# Patient Record
Sex: Male | Born: 1945 | Race: White | Hispanic: No | Smoking: Never smoker
Health system: Southern US, Community
[De-identification: ages and names within clinical notes are randomized; demographics above are authoritative.]

## PROBLEM LIST (undated history)

## (undated) DIAGNOSIS — R42 Dizziness and giddiness: Secondary | ICD-10-CM

## (undated) DIAGNOSIS — K2100 Gastro-esophageal reflux disease with esophagitis, without bleeding: Secondary | ICD-10-CM

## (undated) DIAGNOSIS — C801 Malignant (primary) neoplasm, unspecified: Secondary | ICD-10-CM

## (undated) DIAGNOSIS — E669 Obesity, unspecified: Secondary | ICD-10-CM

## (undated) DIAGNOSIS — K2210 Ulcer of esophagus without bleeding: Secondary | ICD-10-CM

## (undated) DIAGNOSIS — E1141 Type 2 diabetes mellitus with diabetic mononeuropathy: Secondary | ICD-10-CM

## (undated) DIAGNOSIS — T63331A Toxic effect of venom of brown recluse spider, accidental (unintentional), initial encounter: Secondary | ICD-10-CM

## (undated) DIAGNOSIS — I700 Atherosclerosis of aorta: Secondary | ICD-10-CM

## (undated) DIAGNOSIS — I209 Angina pectoris, unspecified: Secondary | ICD-10-CM

## (undated) DIAGNOSIS — G4730 Sleep apnea, unspecified: Secondary | ICD-10-CM

## (undated) DIAGNOSIS — N1831 Chronic kidney disease, stage 3a: Secondary | ICD-10-CM

## (undated) DIAGNOSIS — E1122 Type 2 diabetes mellitus with diabetic chronic kidney disease: Secondary | ICD-10-CM

## (undated) DIAGNOSIS — E119 Type 2 diabetes mellitus without complications: Secondary | ICD-10-CM

## (undated) DIAGNOSIS — I10 Essential (primary) hypertension: Secondary | ICD-10-CM

## (undated) DIAGNOSIS — E785 Hyperlipidemia, unspecified: Secondary | ICD-10-CM

## (undated) DIAGNOSIS — J449 Chronic obstructive pulmonary disease, unspecified: Secondary | ICD-10-CM

## (undated) DIAGNOSIS — I272 Pulmonary hypertension, unspecified: Secondary | ICD-10-CM

## (undated) DIAGNOSIS — I2510 Atherosclerotic heart disease of native coronary artery without angina pectoris: Secondary | ICD-10-CM

## (undated) DIAGNOSIS — I2721 Secondary pulmonary arterial hypertension: Secondary | ICD-10-CM

## (undated) DIAGNOSIS — G4733 Obstructive sleep apnea (adult) (pediatric): Secondary | ICD-10-CM

## (undated) HISTORY — PX: COLONOSCOPY W/ POLYPECTOMY: SHX1380

## (undated) HISTORY — PX: ANKLE FRACTURE SURGERY: SHX122

## (undated) HISTORY — PX: OTHER SURGICAL HISTORY: SHX169

## (undated) HISTORY — PX: HERNIA REPAIR: SHX51

## (undated) HISTORY — PX: BASAL CELL CARCINOMA EXCISION: SHX1214

## (undated) HISTORY — PX: LEG SURGERY: SHX1003

---

## 1982-04-25 HISTORY — PX: FRACTURE SURGERY: SHX138

## 2006-12-09 ENCOUNTER — Other Ambulatory Visit: Payer: Self-pay

## 2006-12-09 ENCOUNTER — Emergency Department: Payer: Self-pay | Admitting: Emergency Medicine

## 2007-04-11 ENCOUNTER — Ambulatory Visit: Payer: Self-pay | Admitting: Internal Medicine

## 2007-04-20 ENCOUNTER — Ambulatory Visit: Payer: Self-pay | Admitting: Emergency Medicine

## 2007-07-30 ENCOUNTER — Ambulatory Visit: Payer: Self-pay | Admitting: Gastroenterology

## 2010-08-09 ENCOUNTER — Ambulatory Visit: Payer: Self-pay | Admitting: Unknown Physician Specialty

## 2010-08-24 ENCOUNTER — Ambulatory Visit: Payer: Self-pay | Admitting: Unknown Physician Specialty

## 2010-09-24 ENCOUNTER — Ambulatory Visit: Payer: Self-pay | Admitting: Unknown Physician Specialty

## 2010-10-24 ENCOUNTER — Ambulatory Visit: Payer: Self-pay | Admitting: Unknown Physician Specialty

## 2010-10-25 ENCOUNTER — Ambulatory Visit: Payer: Self-pay | Admitting: Gastroenterology

## 2010-10-28 LAB — PATHOLOGY REPORT

## 2011-03-31 ENCOUNTER — Emergency Department: Payer: Self-pay | Admitting: *Deleted

## 2012-05-28 ENCOUNTER — Ambulatory Visit: Payer: Self-pay | Admitting: Specialist

## 2013-05-23 ENCOUNTER — Ambulatory Visit: Payer: Self-pay | Admitting: Specialist

## 2013-09-29 DIAGNOSIS — E1141 Type 2 diabetes mellitus with diabetic mononeuropathy: Secondary | ICD-10-CM | POA: Insufficient documentation

## 2013-09-29 DIAGNOSIS — E1169 Type 2 diabetes mellitus with other specified complication: Secondary | ICD-10-CM | POA: Insufficient documentation

## 2013-09-29 DIAGNOSIS — E785 Hyperlipidemia, unspecified: Secondary | ICD-10-CM | POA: Insufficient documentation

## 2015-06-01 DIAGNOSIS — Z6835 Body mass index (BMI) 35.0-35.9, adult: Secondary | ICD-10-CM | POA: Insufficient documentation

## 2015-07-10 ENCOUNTER — Ambulatory Visit: Payer: Medicare Other | Attending: Specialist

## 2015-07-10 DIAGNOSIS — G4761 Periodic limb movement disorder: Secondary | ICD-10-CM | POA: Insufficient documentation

## 2015-07-10 DIAGNOSIS — G4733 Obstructive sleep apnea (adult) (pediatric): Secondary | ICD-10-CM | POA: Diagnosis not present

## 2015-07-10 DIAGNOSIS — R0683 Snoring: Secondary | ICD-10-CM | POA: Diagnosis present

## 2015-08-31 DIAGNOSIS — C4442 Squamous cell carcinoma of skin of scalp and neck: Secondary | ICD-10-CM | POA: Insufficient documentation

## 2015-10-16 ENCOUNTER — Ambulatory Visit: Payer: Medicare Other | Attending: Specialist

## 2015-10-16 DIAGNOSIS — G4761 Periodic limb movement disorder: Secondary | ICD-10-CM | POA: Diagnosis not present

## 2015-10-16 DIAGNOSIS — G4733 Obstructive sleep apnea (adult) (pediatric): Secondary | ICD-10-CM | POA: Insufficient documentation

## 2015-10-19 ENCOUNTER — Other Ambulatory Visit: Payer: Self-pay | Admitting: Gastroenterology

## 2015-10-19 DIAGNOSIS — R1310 Dysphagia, unspecified: Secondary | ICD-10-CM

## 2015-10-28 ENCOUNTER — Ambulatory Visit
Admission: RE | Admit: 2015-10-28 | Discharge: 2015-10-28 | Disposition: A | Discharge status: Home / Self Care | Payer: Medicare Other | Source: Ambulatory Visit | Attending: Gastroenterology | Admitting: Gastroenterology

## 2015-10-28 DIAGNOSIS — K449 Diaphragmatic hernia without obstruction or gangrene: Secondary | ICD-10-CM | POA: Diagnosis not present

## 2015-10-28 DIAGNOSIS — R1310 Dysphagia, unspecified: Secondary | ICD-10-CM | POA: Diagnosis present

## 2015-10-28 DIAGNOSIS — K228 Other specified diseases of esophagus: Secondary | ICD-10-CM | POA: Diagnosis not present

## 2016-01-15 ENCOUNTER — Encounter: Payer: Self-pay | Admitting: *Deleted

## 2016-01-18 ENCOUNTER — Encounter: Payer: Self-pay | Admitting: *Deleted

## 2016-01-18 ENCOUNTER — Ambulatory Visit
Admission: RE | Admit: 2016-01-18 | Discharge: 2016-01-18 | Disposition: A | Discharge status: Home / Self Care | Payer: Medicare Other | Source: Ambulatory Visit | Attending: Gastroenterology | Admitting: Gastroenterology

## 2016-01-18 ENCOUNTER — Encounter
Admission: RE | Disposition: A | Discharge status: Home / Self Care | Payer: Self-pay | Source: Ambulatory Visit | Attending: Gastroenterology

## 2016-01-18 ENCOUNTER — Ambulatory Visit: Payer: Medicare Other | Admitting: Anesthesiology

## 2016-01-18 DIAGNOSIS — K2970 Gastritis, unspecified, without bleeding: Secondary | ICD-10-CM | POA: Insufficient documentation

## 2016-01-18 DIAGNOSIS — Z6837 Body mass index (BMI) 37.0-37.9, adult: Secondary | ICD-10-CM | POA: Diagnosis not present

## 2016-01-18 DIAGNOSIS — K209 Esophagitis, unspecified: Secondary | ICD-10-CM | POA: Insufficient documentation

## 2016-01-18 DIAGNOSIS — K5730 Diverticulosis of large intestine without perforation or abscess without bleeding: Secondary | ICD-10-CM | POA: Diagnosis not present

## 2016-01-18 DIAGNOSIS — J449 Chronic obstructive pulmonary disease, unspecified: Secondary | ICD-10-CM | POA: Diagnosis not present

## 2016-01-18 DIAGNOSIS — Z881 Allergy status to other antibiotic agents status: Secondary | ICD-10-CM | POA: Diagnosis not present

## 2016-01-18 DIAGNOSIS — K2210 Ulcer of esophagus without bleeding: Secondary | ICD-10-CM | POA: Insufficient documentation

## 2016-01-18 DIAGNOSIS — R1310 Dysphagia, unspecified: Secondary | ICD-10-CM | POA: Insufficient documentation

## 2016-01-18 DIAGNOSIS — K2980 Duodenitis without bleeding: Secondary | ICD-10-CM | POA: Diagnosis not present

## 2016-01-18 DIAGNOSIS — Z1211 Encounter for screening for malignant neoplasm of colon: Secondary | ICD-10-CM | POA: Insufficient documentation

## 2016-01-18 DIAGNOSIS — E119 Type 2 diabetes mellitus without complications: Secondary | ICD-10-CM | POA: Diagnosis not present

## 2016-01-18 DIAGNOSIS — G4730 Sleep apnea, unspecified: Secondary | ICD-10-CM | POA: Insufficient documentation

## 2016-01-18 DIAGNOSIS — E785 Hyperlipidemia, unspecified: Secondary | ICD-10-CM | POA: Diagnosis not present

## 2016-01-18 DIAGNOSIS — D122 Benign neoplasm of ascending colon: Secondary | ICD-10-CM | POA: Diagnosis not present

## 2016-01-18 DIAGNOSIS — E669 Obesity, unspecified: Secondary | ICD-10-CM | POA: Insufficient documentation

## 2016-01-18 DIAGNOSIS — Z886 Allergy status to analgesic agent status: Secondary | ICD-10-CM | POA: Insufficient documentation

## 2016-01-18 DIAGNOSIS — D123 Benign neoplasm of transverse colon: Secondary | ICD-10-CM | POA: Insufficient documentation

## 2016-01-18 DIAGNOSIS — I272 Other secondary pulmonary hypertension: Secondary | ICD-10-CM | POA: Diagnosis not present

## 2016-01-18 DIAGNOSIS — Z86010 Personal history of colonic polyps: Secondary | ICD-10-CM | POA: Insufficient documentation

## 2016-01-18 DIAGNOSIS — Z7984 Long term (current) use of oral hypoglycemic drugs: Secondary | ICD-10-CM | POA: Insufficient documentation

## 2016-01-18 DIAGNOSIS — Z7951 Long term (current) use of inhaled steroids: Secondary | ICD-10-CM | POA: Diagnosis not present

## 2016-01-18 DIAGNOSIS — Z79899 Other long term (current) drug therapy: Secondary | ICD-10-CM | POA: Diagnosis not present

## 2016-01-18 DIAGNOSIS — D124 Benign neoplasm of descending colon: Secondary | ICD-10-CM | POA: Diagnosis not present

## 2016-01-18 DIAGNOSIS — I10 Essential (primary) hypertension: Secondary | ICD-10-CM | POA: Insufficient documentation

## 2016-01-18 HISTORY — DX: Sleep apnea, unspecified: G47.30

## 2016-01-18 HISTORY — DX: Chronic obstructive pulmonary disease, unspecified: J44.9

## 2016-01-18 HISTORY — DX: Obesity, unspecified: E66.9

## 2016-01-18 HISTORY — DX: Hyperlipidemia, unspecified: E78.5

## 2016-01-18 HISTORY — PX: COLONOSCOPY WITH PROPOFOL: SHX5780

## 2016-01-18 HISTORY — DX: Secondary pulmonary arterial hypertension: I27.21

## 2016-01-18 HISTORY — PX: ESOPHAGOGASTRODUODENOSCOPY (EGD) WITH PROPOFOL: SHX5813

## 2016-01-18 HISTORY — DX: Essential (primary) hypertension: I10

## 2016-01-18 HISTORY — DX: Type 2 diabetes mellitus without complications: E11.9

## 2016-01-18 HISTORY — DX: Toxic effect of venom of brown recluse spider, accidental (unintentional), initial encounter: T63.331A

## 2016-01-18 LAB — GLUCOSE, CAPILLARY: Glucose-Capillary: 146 mg/dL — ABNORMAL HIGH (ref 65–99)

## 2016-01-18 SURGERY — ESOPHAGOGASTRODUODENOSCOPY (EGD) WITH PROPOFOL
Anesthesia: General

## 2016-01-18 MED ORDER — PHENYLEPHRINE HCL 10 MG/ML IJ SOLN
INTRAMUSCULAR | Status: DC | PRN
  Administered 2016-01-18 (×2): 100 ug via INTRAVENOUS

## 2016-01-18 MED ORDER — FENTANYL CITRATE (PF) 100 MCG/2ML IJ SOLN
INTRAMUSCULAR | Status: DC | PRN
  Administered 2016-01-18: 50 ug via INTRAVENOUS

## 2016-01-18 MED ORDER — SODIUM CHLORIDE 0.9 % IV SOLN
INTRAVENOUS | Status: DC
  Administered 2016-01-18 (×2): via INTRAVENOUS

## 2016-01-18 MED ORDER — PROPOFOL 500 MG/50ML IV EMUL
INTRAVENOUS | Status: DC | PRN
  Administered 2016-01-18: 125 ug/kg/min via INTRAVENOUS

## 2016-01-18 MED ORDER — GLYCOPYRROLATE 0.2 MG/ML IJ SOLN
INTRAMUSCULAR | Status: DC | PRN
  Administered 2016-01-18: 0.2 mg via INTRAVENOUS

## 2016-01-18 MED ORDER — EPHEDRINE SULFATE 50 MG/ML IJ SOLN
INTRAMUSCULAR | Status: DC | PRN
Start: 2016-01-18 — End: 2016-01-18
  Administered 2016-01-18: 10 mg via INTRAVENOUS

## 2016-01-18 MED ORDER — SODIUM CHLORIDE 0.9 % IV SOLN: INTRAVENOUS | Status: DC

## 2016-01-18 MED ORDER — MIDAZOLAM HCL 2 MG/2ML IJ SOLN
INTRAMUSCULAR | Status: DC | PRN
  Administered 2016-01-18: 1 mg via INTRAVENOUS

## 2016-01-18 MED ORDER — PROPOFOL 10 MG/ML IV BOLUS
INTRAVENOUS | Status: DC | PRN
  Administered 2016-01-18: 50 mg via INTRAVENOUS

## 2016-01-18 NOTE — Anesthesia Procedure Notes (Signed)
Date/Time: 01/18/2016 12:09 PM Performed by: Nelda Marseille Pre-anesthesia Checklist: Patient identified, Emergency Drugs available, Suction available, Patient being monitored and Timeout performed Oxygen Delivery Method: Nasal cannula

## 2016-01-18 NOTE — Anesthesia Postprocedure Evaluation (Signed)
Anesthesia Post Note  Patient: Bobby Chen  Procedure(s) Performed: Procedure(s) (LRB): ESOPHAGOGASTRODUODENOSCOPY (EGD) WITH PROPOFOL (N/A) COLONOSCOPY WITH PROPOFOL (N/A)  Patient location during evaluation: Endoscopy Anesthesia Type: General Level of consciousness: awake and alert and oriented Pain management: pain level controlled Vital Signs Assessment: post-procedure vital signs reviewed and stable Respiratory status: spontaneous breathing, nonlabored ventilation and respiratory function stable Cardiovascular status: blood pressure returned to baseline and stable Postop Assessment: no signs of nausea or vomiting Anesthetic complications: no    Last Vitals:  Vitals:   01/18/16 0926 01/18/16 1259  BP: 125/84 (!) 87/57  Pulse: (!) 51   Resp: 15   Temp: 36.4 C (!) 35.9 C    Last Pain:  Vitals:   01/18/16 1259  TempSrc: Tympanic                 Jlyn Cerros

## 2016-01-18 NOTE — Op Note (Signed)
[facility] Patient Name: Bobby Chen Procedure Date: 01/18/2016 11:50 AM MRN: [id number] Account #: [id number] Date of Birth: 02-19-1946 Admit Type: Outpatient Age: 70 Room: [facility] ENDO ROOM 3 Gender: Male Note Status: Finalized Procedure:            Colonoscopy Indications:          Personal history of colonic polyps Providers:            Lollie Sails, MD Referring MD:         Ocie Cornfield. [name] MD, MD (Referring MD) Medicines:            Monitored Anesthesia Care Complications:        No immediate complications. Procedure:            Pre-Anesthesia Assessment:                       - ASA Grade Assessment: III - A patient with severe                        systemic disease.                       After obtaining informed consent, the colonoscope was                        passed under direct vision. Throughout the procedure,                        the patient's blood pressure, pulse, and oxygen                        saturations were monitored continuously. The Olympus                        PCF-H180AL colonoscope ( S#: [id number] ) was introduced                        through the anus and advanced to the the cecum,                        identified by appendiceal orifice and ileocecal valve.                        The quality of the bowel preparation was good. The                        quality of the bowel preparation was good. Findings:      Multiple small-mouthed diverticula were found in the sigmoid colon and       descending colon.      Four flat polyps were found in the proximal ascending colon. The polyps       were 1 to 3 mm in size. These polyps were removed with a cold biopsy       forceps. Resection and retrieval were complete.      Two flat polyps were found in the hepatic flexure. The polyps were 2 to       3 mm in size. These polyps were removed with a cold biopsy forceps.       Resection and retrieval were complete.       Two flat  polyps were found in the descending colon. The polyps were 1 to       2 mm in size. These polyps were removed with a cold biopsy forceps.       Resection and retrieval were complete.      The retroflexed view of the distal rectum and anal verge was normal and       showed no anal or rectal abnormalities.      The digital rectal exam was normal. Impression:           - Diverticulosis in the sigmoid colon and in the                        descending colon.                       - Four 1 to 3 mm polyps in the proximal ascending                        colon, removed with a cold biopsy forceps. Resected and                        retrieved.                       - Two 2 to 3 mm polyps at the hepatic flexure, removed                        with a cold biopsy forceps. Resected and retrieved.                       - Two 1 to 2 mm polyps in the descending colon, removed                        with a cold biopsy forceps. Resected and retrieved.                       - The distal rectum and anal verge are normal on                        retroflexion view. Recommendation:       - Discharge patient to home.                       - Telephone GI clinic for pathology results in 1 week. Procedure Code(s):    --- Professional ---                       [phone], Colonoscopy, flexible; with biopsy, single or                        multiple Diagnosis Code(s):    --- Professional ---                       D12.2, Benign neoplasm of ascending colon                       D12.3, Benign neoplasm of transverse colon (hepatic                        flexure or splenic  flexure)                       D12.4, Benign neoplasm of descending colon                       Z86.010, Personal history of colonic polyps                       K57.30, Diverticulosis of large intestine without                        perforation or abscess without bleeding CPT copyright 2016 [employer]. All rights  reserved. The codes documented in this report are preliminary and upon coder review may  be revised to meet current compliance requirements. Lollie Sails, MD 01/18/2016 12:58:07 PM This report has been signed electronically. Number of Addenda: 0 Note Initiated On: 01/18/2016 11:50 AM Scope Withdrawal Time: 0 hours 17 minutes 48 seconds  Total Procedure Duration: 0 hours 32 minutes 0 seconds       [facility]

## 2016-01-18 NOTE — H&P (Signed)
Outpatient short stay form Pre-procedure 01/18/2016 11:48 AM Lollie Sails MD  Primary Physician: Dr. Frazier Richards  Reason for visit:  EGD and colonoscopy  History of present illness:  Patient is a 70 year old male presenting today as above. He has a personal history of adenomatous colon polyps. He is also had some mid chest discomfort, odynophagia but has not dysphagia. He does not regurgitate foods. He did have a barium swallow with a tablet study done on 10/28/2015. This showed some mild changes of presbyesophagus without evidence of stricture reflux or esophagitis. He takes no aspirin or blood thinners.    Current Facility-Administered Medications:  .  0.9 %  sodium chloride infusion, , Intravenous, Continuous, Lollie Sails, MD, Last Rate: 20 mL/hr at 01/18/16 0950 .  0.9 %  sodium chloride infusion, , Intravenous, Continuous, Lollie Sails, MD  Prescriptions Prior to Admission  Medication Sig Dispense Refill Last Dose  . albuterol (PROVENTIL HFA;VENTOLIN HFA) 108 (90 Base) MCG/ACT inhaler Inhale 2 puffs into the lungs every 4 (four) hours as needed for wheezing or shortness of breath.   Past Week at Unknown time  . Bioflavonoid Products (ESTER C PO) Take 500 mg by mouth daily.   Past Week at Unknown time  . canagliflozin (INVOKANA) 300 MG TABS tablet Take 300 mg by mouth daily before breakfast.   Past Week at Unknown time  . Chromium 1 MG CAPS Take by mouth.   Past Week at Unknown time  . co-enzyme Q-10 30 MG capsule Take 10 mg by mouth daily.   Past Week at Unknown time  . COD LIVER OIL PO Take by mouth.   Past Week at Unknown time  . fluticasone (FLONASE) 50 MCG/ACT nasal spray Place 2 sprays into both nostrils daily.   Past Week at Unknown time  . glipiZIDE (GLUCOTROL XL) 5 MG 24 hr tablet Take 5 mg by mouth daily with breakfast.   Past Week at Unknown time  . Lysine 500 MG TABS Take by mouth daily.   Past Week at Unknown time  . metFORMIN (GLUCOPHAGE) 500 MG  tablet Take 1,000 mg by mouth 2 (two) times daily with a meal.   Past Week at Unknown time  . Multiple Vitamin (MULTIVITAMIN) tablet Take 1 tablet by mouth daily.   Past Week at Unknown time  . OLIVE LEAF PO Take by mouth.   Past Week at Unknown time  . omega-3 acid ethyl esters (LOVAZA) 1 g capsule Take by mouth 2 (two) times daily.   Past Week at Unknown time  . omega-3 acid ethyl esters (LOVAZA) 1 g capsule Take by mouth 2 (two) times daily.   Past Week at Unknown time  . Phytosterol Esters (PHYTOSTEROL COMPLEX PO) Take by mouth.   Past Week at Unknown time  . pyridoxine (B-6) 100 MG tablet Take 100 mg by mouth daily.   Past Week at Unknown time  . saxagliptin HCl (ONGLYZA) 5 MG TABS tablet Take by mouth daily.   Past Week at Unknown time  . Selenium (RA SELENIUM) 200 MCG TABS Take by mouth.   Past Week at Unknown time  . simvastatin (ZOCOR) 40 MG tablet Take 40 mg by mouth daily.   Past Week at Unknown time  . triamterene-hydrochlorothiazide (MAXZIDE-25) 37.5-25 MG tablet Take 1 tablet by mouth daily.   Past Week at Unknown time  . valsartan (DIOVAN) 160 MG tablet Take 160 mg by mouth daily.   Past Week at Unknown time  . vitamin  E (VITAMIN E) 400 UNIT capsule Take 400 Units by mouth daily.   Past Week at Unknown time     Allergies  Allergen Reactions  . Amoxil [Amoxicillin]   . Buffered Aspirin      Past Medical History:  Diagnosis Date  . Brown recluse spider bite   . COPD (chronic obstructive pulmonary disease) ([place])   . Diabetes mellitus without complication ([place])   . Elevated lipids   . Hypertension   . Obesity   . Pulmonary arterial hypertension ([place])   . Sleep apnea     Review of systems:      Physical Exam    Heart and lungs: Regular rate and rhythm without rub or gallop, lungs are bilaterally clear.    HEENT: Normocephalic atraumatic eyes are anicteric    Other:     Pertinant exam for procedure: Soft nontender nondistended bowel sounds positive normoactive.  Protuberant.    Planned proceedures: EGD, colonoscopy and indicated procedures. I have discussed the risks benefits and complications of procedures to include not limited to bleeding, infection, perforation and the risk of sedation and the patient wishes to proceed.    Lollie Sails, MD Gastroenterology 01/18/2016  11:48 AM

## 2016-01-18 NOTE — Transfer of Care (Signed)
Immediate Anesthesia Transfer of Care Note  Patient: Bobby Chen  Procedure(s) Performed: Procedure(s): ESOPHAGOGASTRODUODENOSCOPY (EGD) WITH PROPOFOL (N/A) COLONOSCOPY WITH PROPOFOL (N/A)  Patient Location: PACU  Anesthesia Type:General  Level of Consciousness: sedated  Airway & Oxygen Therapy: Patient Spontanous Breathing and Patient connected to nasal cannula oxygen  Post-op Assessment: Report given to RN and Post -op Vital signs reviewed and stable  Post vital signs: Reviewed and stable  Last Vitals:  Vitals:   01/18/16 0926 01/18/16 1259  BP: 125/84 (!) 87/57  Pulse: (!) 51   Resp: 15   Temp: 36.4 C (!) 35.9 C    Last Pain:  Vitals:   01/18/16 1259  TempSrc: Tympanic         Complications: No apparent anesthesia complications

## 2016-01-18 NOTE — Anesthesia Preprocedure Evaluation (Signed)
Anesthesia Evaluation   Patient awake    Reviewed: Allergy & Precautions, NPO status , Patient's Chart, lab work & pertinent test results  History of Anesthesia Complications Negative for: history of anesthetic complications  Airway Mallampati: II  TM Distance: >3 FB Neck ROM: Full    Dental no notable dental hx.    Pulmonary sleep apnea and Continuous Positive Airway Pressure Ventilation , COPD,  COPD inhaler,    breath sounds clear to auscultation- rhonchi (-) wheezing      Cardiovascular Exercise Tolerance: Good hypertension, Pt. on medications (-) CAD and (-) Past MI  Rhythm:Regular Rate:Normal - Systolic murmurs and - Diastolic murmurs    Neuro/Psych negative neurological ROS  negative psych ROS   GI/Hepatic negative GI ROS, Neg liver ROS,   Endo/Other  diabetes, Type 2, Oral Hypoglycemic Agents  Renal/GU negative Renal ROS     Musculoskeletal negative musculoskeletal ROS (+)   Abdominal (+) + obese,   Peds  Hematology negative hematology ROS (+)   Anesthesia Other Findings Past Medical History: No date: [place] recluse spider bite No date: COPD (chronic obstructive pulmonary disease) (* No date: Diabetes mellitus without complication (HCC) No date: Elevated lipids No date: Hypertension No date: Obesity No date: Pulmonary arterial hypertension (HCC) No date: Sleep apnea   Reproductive/Obstetrics                             Anesthesia Physical Anesthesia Plan  ASA: III  Anesthesia Plan: General   Post-op Pain Management:    Induction: Intravenous  Airway Management Planned: Natural Airway  Additional Equipment:   Intra-op Plan:   Post-operative Plan:   Informed Consent: I have reviewed the patients History and Physical, chart, labs and discussed the procedure including the risks, benefits and alternatives for the proposed anesthesia with the patient or authorized  representative who has indicated his/her understanding and acceptance.   Dental advisory given  Plan Discussed with: CRNA and Anesthesiologist  Anesthesia Plan Comments:         Anesthesia Quick Evaluation

## 2016-01-18 NOTE — Op Note (Signed)
[facility] Patient Name: Bobby Chen Procedure Date: 01/18/2016 11:51 AM MRN: [id number] Account #: [id number] Date of Birth: 26-Jan-1946 Admit Type: Outpatient Age: 71 Room: [facility] ENDO ROOM 3 Gender: Male Note Status: Finalized Procedure:            Upper GI endoscopy Indications:          Dysphagia, Odynophagia Providers:            Lollie Sails, MD Referring MD:         Ocie Cornfield. [name] MD, MD (Referring MD) Medicines:            Monitored Anesthesia Care Complications:        No immediate complications. Procedure:            Pre-Anesthesia Assessment:                       - ASA Grade Assessment: III - A patient with severe                        systemic disease.                       After obtaining informed consent, the endoscope was                        passed under direct vision. Throughout the procedure,                        the patient's blood pressure, pulse, and oxygen                        saturations were monitored continuously. The Endoscope                        was introduced through the mouth, and advanced to the                        third part of duodenum. The upper GI endoscopy was                        accomplished without difficulty. The patient tolerated                        the procedure well. Findings:      LA Grade B (one or more mucosal breaks greater than 5 mm, not extending       between the tops of two mucosal folds) esophagitis with no bleeding was       found. Biopsies were taken with a cold forceps for histology.      The exam of the esophagus was otherwise normal.      Diffuse mild inflammation characterized by congestion (edema), erythema       and granularity was found in the gastric body and in the gastric antrum.       Biopsies were taken with a cold forceps for histology. Biopsies were       taken with a cold forceps for Helicobacter pylori testing.      The cardia and gastric fundus  were normal on retroflexion.      Patchy moderate inflammation characterized by congestion (edema),       erythema and granularity was  found in the duodenal bulb and in the       second portion of the duodenum.      Diffuse mild mucosal variance characterized by smoothness was found in       the second portion of the duodenum and in the third portion of the       duodenum. Impression:           - LA Grade B erosive esophagitis. Biopsied.                       - Gastritis. Biopsied.                       - Duodenitis.                       - Mucosal variant in the duodenum. Recommendation:       - Use Protonix (pantoprazole) 40 mg PO daily.                       - Await pathology results. Procedure Code(s):    --- Professional ---                       [phone], Esophagogastroduodenoscopy, flexible, transoral;                        with biopsy, single or multiple Diagnosis Code(s):    --- Professional ---                       K20.8, Other esophagitis                       K29.70, Gastritis, unspecified, without bleeding                       K29.80, Duodenitis without bleeding                       K31.89, Other diseases of stomach and duodenum                       R13.10, Dysphagia, unspecified CPT copyright 2016 American Medical Association. All rights reserved. The codes documented in this report are preliminary and upon coder review may  be revised to meet current compliance requirements. Lollie Sails, MD 01/18/2016 12:20:14 PM This report has been signed electronically. Number of Addenda: 0 Note Initiated On: 01/18/2016 11:51 AM      [facility]

## 2016-01-19 ENCOUNTER — Encounter: Payer: Self-pay | Admitting: Gastroenterology

## 2016-01-19 LAB — SURGICAL PATHOLOGY

## 2016-08-18 ENCOUNTER — Ambulatory Visit
Admission: RE | Admit: 2016-08-18 | Discharge: 2016-08-18 | Disposition: A | Discharge status: Home / Self Care | Payer: Medicare Other | Source: Ambulatory Visit | Attending: Podiatry | Admitting: Podiatry

## 2016-08-18 ENCOUNTER — Other Ambulatory Visit: Payer: Self-pay | Admitting: Podiatry

## 2016-08-18 DIAGNOSIS — M79604 Pain in right leg: Secondary | ICD-10-CM | POA: Diagnosis not present

## 2016-08-18 DIAGNOSIS — M7989 Other specified soft tissue disorders: Secondary | ICD-10-CM | POA: Diagnosis not present

## 2016-10-04 ENCOUNTER — Other Ambulatory Visit: Payer: Self-pay | Admitting: Podiatry

## 2016-10-04 DIAGNOSIS — G8929 Other chronic pain: Secondary | ICD-10-CM

## 2016-10-04 DIAGNOSIS — M25571 Pain in right ankle and joints of right foot: Principal | ICD-10-CM

## 2016-10-13 ENCOUNTER — Ambulatory Visit
Admission: RE | Admit: 2016-10-13 | Discharge: 2016-10-13 | Disposition: A | Discharge status: Home / Self Care | Payer: Medicare Other | Source: Ambulatory Visit | Attending: Podiatry | Admitting: Podiatry

## 2016-10-13 DIAGNOSIS — M19071 Primary osteoarthritis, right ankle and foot: Secondary | ICD-10-CM | POA: Diagnosis not present

## 2016-10-13 DIAGNOSIS — M948X7 Other specified disorders of cartilage, ankle and foot: Secondary | ICD-10-CM | POA: Diagnosis not present

## 2016-10-13 DIAGNOSIS — G8929 Other chronic pain: Secondary | ICD-10-CM

## 2016-10-13 DIAGNOSIS — M25571 Pain in right ankle and joints of right foot: Secondary | ICD-10-CM | POA: Diagnosis not present

## 2017-05-08 DIAGNOSIS — T63331A Toxic effect of venom of brown recluse spider, accidental (unintentional), initial encounter: Secondary | ICD-10-CM | POA: Insufficient documentation

## 2017-08-07 DIAGNOSIS — Z0000 Encounter for general adult medical examination without abnormal findings: Secondary | ICD-10-CM | POA: Insufficient documentation

## 2018-01-03 ENCOUNTER — Other Ambulatory Visit: Payer: Self-pay | Admitting: Physician Assistant

## 2018-01-03 DIAGNOSIS — H90A22 Sensorineural hearing loss, unilateral, left ear, with restricted hearing on the contralateral side: Secondary | ICD-10-CM

## 2018-01-22 ENCOUNTER — Ambulatory Visit
Admission: RE | Admit: 2018-01-22 | Discharge: 2018-01-22 | Disposition: A | Discharge status: Home / Self Care | Payer: Medicare Other | Source: Ambulatory Visit | Attending: Physician Assistant | Admitting: Physician Assistant

## 2018-01-22 DIAGNOSIS — G319 Degenerative disease of nervous system, unspecified: Secondary | ICD-10-CM | POA: Diagnosis not present

## 2018-01-22 DIAGNOSIS — H90A22 Sensorineural hearing loss, unilateral, left ear, with restricted hearing on the contralateral side: Secondary | ICD-10-CM | POA: Insufficient documentation

## 2018-01-22 DIAGNOSIS — I739 Peripheral vascular disease, unspecified: Secondary | ICD-10-CM | POA: Diagnosis not present

## 2018-01-22 MED ORDER — GADOBENATE DIMEGLUMINE 529 MG/ML IV SOLN
20.0000 mL | Freq: Once | INTRAVENOUS | Status: AC | PRN
  Administered 2018-01-22: 20 mL via INTRAVENOUS

## 2018-03-19 DIAGNOSIS — I10 Essential (primary) hypertension: Secondary | ICD-10-CM | POA: Insufficient documentation

## 2018-04-02 ENCOUNTER — Other Ambulatory Visit: Payer: Self-pay | Admitting: Physician Assistant

## 2018-04-02 DIAGNOSIS — H9202 Otalgia, left ear: Secondary | ICD-10-CM

## 2018-04-19 ENCOUNTER — Ambulatory Visit
Admission: RE | Admit: 2018-04-19 | Discharge: 2018-04-19 | Disposition: A | Discharge status: Home / Self Care | Payer: Medicare Other | Source: Ambulatory Visit | Attending: Physician Assistant | Admitting: Physician Assistant

## 2018-04-19 DIAGNOSIS — H9202 Otalgia, left ear: Secondary | ICD-10-CM | POA: Insufficient documentation

## 2018-04-19 HISTORY — DX: Malignant (primary) neoplasm, unspecified: C80.1

## 2018-04-19 MED ORDER — IOHEXOL 300 MG/ML  SOLN
75.0000 mL | Freq: Once | INTRAMUSCULAR | Status: AC | PRN
  Administered 2018-04-19: 75 mL via INTRAVENOUS

## 2018-05-18 ENCOUNTER — Other Ambulatory Visit: Payer: Self-pay | Admitting: Acute Care

## 2018-05-18 DIAGNOSIS — R42 Dizziness and giddiness: Secondary | ICD-10-CM

## 2018-05-18 DIAGNOSIS — R51 Headache: Secondary | ICD-10-CM

## 2018-05-18 DIAGNOSIS — R519 Headache, unspecified: Secondary | ICD-10-CM

## 2018-05-18 DIAGNOSIS — R202 Paresthesia of skin: Principal | ICD-10-CM

## 2018-05-18 DIAGNOSIS — H93A3 Pulsatile tinnitus, bilateral: Secondary | ICD-10-CM

## 2018-05-18 DIAGNOSIS — R200 Anesthesia of skin: Secondary | ICD-10-CM

## 2018-05-28 ENCOUNTER — Ambulatory Visit
Admission: RE | Admit: 2018-05-28 | Discharge: 2018-05-28 | Disposition: A | Discharge status: Home / Self Care | Payer: Medicare Other | Source: Ambulatory Visit | Attending: Acute Care | Admitting: Acute Care

## 2018-05-28 DIAGNOSIS — H93A3 Pulsatile tinnitus, bilateral: Secondary | ICD-10-CM | POA: Diagnosis present

## 2018-05-31 ENCOUNTER — Encounter: Payer: Self-pay | Admitting: Emergency Medicine

## 2018-05-31 ENCOUNTER — Other Ambulatory Visit: Payer: Self-pay

## 2018-05-31 ENCOUNTER — Ambulatory Visit
Admission: EM | Admit: 2018-05-31 | Discharge: 2018-05-31 | Disposition: A | Discharge status: Home / Self Care | Payer: Medicare Other | Attending: Family Medicine | Admitting: Family Medicine

## 2018-05-31 DIAGNOSIS — R0981 Nasal congestion: Secondary | ICD-10-CM | POA: Diagnosis not present

## 2018-05-31 MED ORDER — FLUTICASONE PROPIONATE 50 MCG/ACT NA SUSP: 2.0000 | Freq: Every day | NASAL | 0 refills | Status: AC

## 2018-05-31 MED ORDER — CETIRIZINE-PSEUDOEPHEDRINE ER 5-120 MG PO TB12: 1.0000 | ORAL_TABLET | Freq: Every day | ORAL | 0 refills | Status: DC

## 2018-05-31 NOTE — ED Triage Notes (Signed)
Patient in today c/o runny nose, sneezing and slight cough x 2-3 days. Patient denies fever. Patient has tried OTC Zyrtec and Zicam.

## 2018-05-31 NOTE — ED Provider Notes (Signed)
[facility]    CSN: [id number] Arrival date & time: 05/31/18  0855     History   Chief Complaint Chief Complaint  Patient presents with  . Nasal Congestion  . sneezing    HPI Bobby Chen is a 73 y.o. male.   HPI  Old male presents today with a runny nose sneezing and only a slight cough last 2 to 3 days.  He said no fever or chills.  He has a history of COPD but coughing is not a prominent feature of his illness this time.  Is been using Zyrtec which seem to dry his sinuses up somewhat.  Temperature is 97.7 pulse of 65 respirations 18 O2 sats at 97%.        Past Medical History:  Diagnosis Date  . Brown recluse spider bite   . Cancer ([place])   . COPD (chronic obstructive pulmonary disease) ([place])   . Diabetes mellitus without complication ([place])   . Elevated lipids   . Hypertension   . Obesity   . Pulmonary arterial hypertension ([place])   . Sleep apnea     There are no active problems to display for this patient.   Past Surgical History:  Procedure Laterality Date  . ANKLE FRACTURE SURGERY    . BASAL CELL CARCINOMA EXCISION N/A    scalp  . COLONOSCOPY W/ POLYPECTOMY    . COLONOSCOPY WITH PROPOFOL N/A 01/18/2016   Procedure: COLONOSCOPY WITH PROPOFOL;  Surgeon: Lollie Sails, MD;  Location: [facility] ENDOSCOPY;  Service: Endoscopy;  Laterality: N/A;  . ESOPHAGOGASTRODUODENOSCOPY (EGD) WITH PROPOFOL N/A 01/18/2016   Procedure: ESOPHAGOGASTRODUODENOSCOPY (EGD) WITH PROPOFOL;  Surgeon: Lollie Sails, MD;  Location: [facility] ENDOSCOPY;  Service: Endoscopy;  Laterality: N/A;  . FRACTURE SURGERY    . HERNIA REPAIR    . toenail removal Bilateral        Home Medications    Prior to Admission medications   Medication Sig Start Date End Date Taking? Authorizing Provider  albuterol (PROVENTIL HFA;VENTOLIN HFA) 108 (90 Base) MCG/ACT inhaler Inhale 2 puffs into the lungs every 4 (four) hours as needed for wheezing or shortness of breath.   Yes [provider]  Bioflavonoid Products (ESTER C PO) Take 500 mg by mouth daily.   Yes [provider]  canagliflozin (INVOKANA) 300 MG TABS tablet Take 300 mg by mouth daily before breakfast.   Yes [provider]  Chromium 1 MG CAPS Take by mouth.   Yes [provider]  co-enzyme Q-10 30 MG capsule Take 10 mg by mouth daily.   Yes [provider]  COD LIVER OIL PO Take by mouth.   Yes [provider]  fluticasone (FLONASE) 50 MCG/ACT nasal spray Place 2 sprays into both nostrils daily.   Yes [provider]  glipiZIDE (GLUCOTROL XL) 5 MG 24 hr tablet Take 5 mg by mouth daily with breakfast.   Yes [provider]  Lysine 500 MG TABS Take by mouth daily.   Yes [provider]  metFORMIN (GLUCOPHAGE) 500 MG tablet Take 1,000 mg by mouth 2 (two) times daily with a meal.   Yes [provider]  Multiple Vitamin (MULTIVITAMIN) tablet Take 1 tablet by mouth daily.   Yes [provider]  OLIVE LEAF PO Take by mouth.   Yes [provider]  pantoprazole (PROTONIX) 40 MG tablet daily as needed. 01/09/17  Yes [provider]  pyridoxine (B-6) 100 MG tablet Take 100 mg by mouth  daily.   Yes [provider]  saxagliptin HCl (ONGLYZA) 5 MG TABS tablet Take by mouth daily.   Yes [provider]  Selenium (RA SELENIUM) 200 MCG TABS Take by mouth.   Yes [provider]  simvastatin (ZOCOR) 40 MG tablet Take 40 mg by mouth daily.   Yes [provider]  sitaGLIPtin (JANUVIA) 100 MG tablet Take 1 tablet by mouth daily. 05/07/18  Yes [provider]  triamterene-hydrochlorothiazide (MAXZIDE-25) 37.5-25 MG tablet Take 1 tablet by mouth daily.   Yes [provider]  valsartan (DIOVAN) 160 MG tablet Take 160 mg by mouth daily.   Yes [provider]  vitamin E (VITAMIN E) 400 UNIT capsule Take 400 Units by mouth daily.   Yes [provider]    cetirizine-pseudoephedrine (ZYRTEC-D) 5-120 MG tablet Take 1 tablet by mouth daily. 05/31/18   Lorin Picket, PA-C  fluticasone (FLONASE) 50 MCG/ACT nasal spray Place 2 sprays into both nostrils daily. 05/31/18   Lorin Picket, PA-C    Family History Family History  Problem Relation Age of Onset  . Ovarian cancer Mother   . Ulcers Father     Social History Social History   Tobacco Use  . Smoking status: Never Smoker  . Smokeless tobacco: Never Used  Substance Use Topics  . Alcohol use: No  . Drug use: No     Allergies   Amoxil [amoxicillin] and Buffered aspirin   Review of Systems Review of Systems  Constitutional: Positive for activity change. Negative for appetite change, chills, fatigue and fever.  HENT: Positive for congestion, postnasal drip, rhinorrhea and sneezing.   All other systems reviewed and are negative.    Physical Exam Triage Vital Signs ED Triage Vitals  Enc Vitals Group     BP 05/31/18 0915 (!) 141/65     Pulse Rate 05/31/18 0915 65     Resp 05/31/18 0915 18     Temp 05/31/18 0915 97.7 F (36.5 C)     Temp Source 05/31/18 0915 Oral     SpO2 05/31/18 0915 97 %     Weight 05/31/18 0914 234 lb (106.1 kg)     Height 05/31/18 0914 5\' 6"  (1.676 m)     Head Circumference --      Peak Flow --      Pain Score 05/31/18 0913 0     Pain Loc --      Pain Edu? --      Excl. in [place]? --    No data found.  Updated Vital Signs BP (!) 141/65 (BP Location: Left Arm)   Pulse 65   Temp 97.7 F (36.5 C) (Oral)   Resp 18   Ht 5\' 6"  (1.676 m)   Wt 234 lb (106.1 kg)   SpO2 97%   BMI 37.77 kg/m   Visual Acuity Right Eye Distance:   Left Eye Distance:   Bilateral Distance:    Right Eye Near:   Left Eye Near:    Bilateral Near:     Physical Exam Vitals signs and nursing note reviewed.  Constitutional:      General: He is not in acute distress.    Appearance: Normal appearance. He is not ill-appearing, toxic-appearing or diaphoretic.  HENT:      Head: Normocephalic.     Right Ear: Tympanic membrane normal.     Left Ear: Tympanic membrane normal.     Ears:     Comments: Both TMs are dark.  Nose: Congestion and rhinorrhea present.     Mouth/Throat:     Mouth: Mucous membranes are moist.     Pharynx: No oropharyngeal exudate or posterior oropharyngeal erythema.  Eyes:     Conjunctiva/sclera: Conjunctivae normal.  Neck:     Musculoskeletal: Normal range of motion.  Pulmonary:     Effort: Pulmonary effort is normal.     Breath sounds: Normal breath sounds.  Musculoskeletal: Normal range of motion.  Lymphadenopathy:     Cervical: No cervical adenopathy.  Skin:    General: Skin is warm and dry.  Neurological:     General: No focal deficit present.     Mental Status: He is alert and oriented to person, place, and time.  Psychiatric:        Mood and Affect: Mood normal.        Behavior: Behavior normal.        Thought Content: Thought content normal.        Judgment: Judgment normal.      UC Treatments / Results  Labs (all labs ordered are listed, but only abnormal results are displayed) Labs Reviewed - No data to display  EKG None  Radiology No results found.  Procedures Procedures (including critical care time)  Medications Ordered in UC Medications - No data to display  Initial Impression / Assessment and Plan / UC Course  I have reviewed the triage vital signs and the nursing notes.  Pertinent labs & imaging results that were available during my care of the patient were reviewed by me and considered in my medical decision making (see chart for details).   Presents with nasal congestion.  Likely allergic type of rhinitis since the Zyrtec helped him the discharge from his nose.  He does have a history of COPD but this does not seem to be an issue for him today.  Prescribed Zyrtec-D for additional drying as well as asking him to use the Flonase for another 2 to 3weeks.  If he is not improving he should  follow-up with her primary care and he also has an appointment with ENT in the very near future for hearing loss.  He can discuss that with ENT at that time.   Final Clinical Impressions(s) / UC Diagnoses   Final diagnoses:  Nasal congestion   Discharge Instructions   None    ED Prescriptions    Medication Sig Dispense Auth. Provider   cetirizine-pseudoephedrine (ZYRTEC-D) 5-120 MG tablet Take 1 tablet by mouth daily. 30 tablet Crecencio Mc P, PA-C   fluticasone (FLONASE) 50 MCG/ACT nasal spray Place 2 sprays into both nostrils daily. 16 g Lorin Picket, PA-C     Controlled Substance Prescriptions [place] Controlled Substance Registry consulted? Not Applicable   Lorin Picket, PA-C 05/31/18 1104

## 2018-06-13 ENCOUNTER — Other Ambulatory Visit: Payer: Self-pay

## 2018-06-13 ENCOUNTER — Ambulatory Visit
Admission: EM | Admit: 2018-06-13 | Discharge: 2018-06-13 | Disposition: A | Discharge status: Home / Self Care | Payer: Medicare Other | Attending: Family Medicine | Admitting: Family Medicine

## 2018-06-13 DIAGNOSIS — J0100 Acute maxillary sinusitis, unspecified: Secondary | ICD-10-CM | POA: Diagnosis not present

## 2018-06-13 DIAGNOSIS — R05 Cough: Secondary | ICD-10-CM | POA: Diagnosis not present

## 2018-06-13 DIAGNOSIS — R059 Cough, unspecified: Secondary | ICD-10-CM

## 2018-06-13 MED ORDER — HYDROCOD POLST-CPM POLST ER 10-8 MG/5ML PO SUER: 5.0000 mL | Freq: Two times a day (BID) | ORAL | 0 refills | Status: DC | PRN

## 2018-06-13 MED ORDER — DOXYCYCLINE HYCLATE 100 MG PO TABS: 100.0000 mg | ORAL_TABLET | Freq: Two times a day (BID) | ORAL | 0 refills | Status: DC

## 2018-06-13 NOTE — ED Triage Notes (Signed)
Patient complains of cough, congestion, sinus pain x 05/27/2018. Patient states that he was seen here on 05/31/2018. Patient states that he has not been improving and thinks that he may need a antibiotic and cough syrup.

## 2018-06-13 NOTE — ED Provider Notes (Signed)
[facility]    CSN: [id number] Arrival date & time: 06/13/18  1513     History   Chief Complaint Chief Complaint  Patient presents with  . Cough    HPI Bobby Chen is a 73 y.o. male.   The history is provided by the patient.  URI  Presenting symptoms: congestion, cough, facial pain and rhinorrhea   Severity:  Moderate Onset quality:  Sudden Duration:  2 weeks Timing:  Constant Progression:  Worsening Chronicity:  New Relieved by:  Nothing Ineffective treatments:  OTC medications and prescription medications Associated symptoms: sinus pain   Associated symptoms: no wheezing   Risk factors: chronic respiratory disease and sick contacts     Past Medical History:  Diagnosis Date  . Brown recluse spider bite   . Cancer ([place])   . COPD (chronic obstructive pulmonary disease) ([place])   . Diabetes mellitus without complication ([place])   . Elevated lipids   . Hypertension   . Obesity   . Pulmonary arterial hypertension ([place])   . Sleep apnea     There are no active problems to display for this patient.   Past Surgical History:  Procedure Laterality Date  . ANKLE FRACTURE SURGERY    . BASAL CELL CARCINOMA EXCISION N/A    scalp  . COLONOSCOPY W/ POLYPECTOMY    . COLONOSCOPY WITH PROPOFOL N/A 01/18/2016   Procedure: COLONOSCOPY WITH PROPOFOL;  Surgeon: Lollie Sails, MD;  Location: [facility] ENDOSCOPY;  Service: Endoscopy;  Laterality: N/A;  . ESOPHAGOGASTRODUODENOSCOPY (EGD) WITH PROPOFOL N/A 01/18/2016   Procedure: ESOPHAGOGASTRODUODENOSCOPY (EGD) WITH PROPOFOL;  Surgeon: Lollie Sails, MD;  Location: [facility] ENDOSCOPY;  Service: Endoscopy;  Laterality: N/A;  . FRACTURE SURGERY    . HERNIA REPAIR    . toenail removal Bilateral        Home Medications    Prior to Admission medications   Medication Sig Start Date End Date Taking? Authorizing Provider  albuterol (PROVENTIL HFA;VENTOLIN HFA) 108 (90 Base) MCG/ACT inhaler Inhale 2 puffs into the lungs  every 4 (four) hours as needed for wheezing or shortness of breath.   Yes [provider]  Bioflavonoid Products (ESTER C PO) Take 500 mg by mouth daily.   Yes [provider]  canagliflozin (INVOKANA) 300 MG TABS tablet Take 300 mg by mouth daily before breakfast.   Yes [provider]  cetirizine-pseudoephedrine (ZYRTEC-D) 5-120 MG tablet Take 1 tablet by mouth daily. 05/31/18  Yes Lorin Picket, PA-C  Chromium 1 MG CAPS Take by mouth.   Yes [provider]  co-enzyme Q-10 30 MG capsule Take 10 mg by mouth daily.   Yes [provider]  COD LIVER OIL PO Take by mouth.   Yes [provider]  fluticasone (FLONASE) 50 MCG/ACT nasal spray Place 2 sprays into both nostrils daily.   Yes [provider]  fluticasone (FLONASE) 50 MCG/ACT nasal spray Place 2 sprays into both nostrils daily. 05/31/18  Yes Crecencio Mc P, PA-C  glipiZIDE (GLUCOTROL XL) 5 MG 24 hr tablet Take 5 mg by mouth daily with breakfast.   Yes [provider]  Lysine 500 MG TABS Take by mouth daily.   Yes [provider]  metFORMIN (GLUCOPHAGE) 500 MG tablet Take 1,000 mg by mouth 2 (two) times daily with a meal.   Yes [provider]  Multiple Vitamin (MULTIVITAMIN) tablet Take 1 tablet by mouth daily.   Yes [provider]  OLIVE LEAF PO Take by mouth.  Yes [provider]  pantoprazole (PROTONIX) 40 MG tablet daily as needed. 01/09/17  Yes [provider]  pyridoxine (B-6) 100 MG tablet Take 100 mg by mouth daily.   Yes [provider]  saxagliptin HCl (ONGLYZA) 5 MG TABS tablet Take by mouth daily.   Yes [provider]  Selenium (RA SELENIUM) 200 MCG TABS Take by mouth.   Yes [provider]  simvastatin (ZOCOR) 40 MG tablet Take 40 mg by mouth daily.   Yes [provider]  sitaGLIPtin (JANUVIA) 100 MG tablet Take 1 tablet by mouth daily. 05/07/18  Yes [provider]    triamterene-hydrochlorothiazide (MAXZIDE-25) 37.5-25 MG tablet Take 1 tablet by mouth daily.   Yes [provider]  valsartan (DIOVAN) 160 MG tablet Take 160 mg by mouth daily.   Yes [provider]  vitamin E (VITAMIN E) 400 UNIT capsule Take 400 Units by mouth daily.   Yes [provider]  chlorpheniramine-HYDROcodone (TUSSIONEX PENNKINETIC ER) 10-8 MG/5ML SUER Take 5 mLs by mouth every 12 (twelve) hours as needed. 06/13/18   Norval Gable, MD  doxycycline (VIBRA-TABS) 100 MG tablet Take 1 tablet (100 mg total) by mouth 2 (two) times daily. 06/13/18   Norval Gable, MD    Family History Family History  Problem Relation Age of Onset  . Ovarian cancer Mother   . Ulcers Father     Social History Social History   Tobacco Use  . Smoking status: Never Smoker  . Smokeless tobacco: Never Used  Substance Use Topics  . Alcohol use: No  . Drug use: No     Allergies   Amoxil [amoxicillin] and Buffered aspirin   Review of Systems Review of Systems  HENT: Positive for congestion, rhinorrhea and sinus pain.   Respiratory: Positive for cough. Negative for wheezing.      Physical Exam Triage Vital Signs ED Triage Vitals  Enc Vitals Group     BP 06/13/18 1539 123/71     Pulse Rate 06/13/18 1539 77     Resp 06/13/18 1539 18     Temp 06/13/18 1539 98.3 F (36.8 C)     Temp Source 06/13/18 1539 Oral     SpO2 06/13/18 1539 96 %     Weight 06/13/18 1537 234 lb (106.1 kg)     Height 06/13/18 1537 5\' 6"  (1.676 m)     Head Circumference --      Peak Flow --      Pain Score 06/13/18 1536 4     Pain Loc --      Pain Edu? --      Excl. in [place]? --    No data found.  Updated Vital Signs BP 123/71 (BP Location: Left Arm)   Pulse 77   Temp 98.3 F (36.8 C) (Oral)   Resp 18   Ht 5\' 6"  (1.676 m)   Wt 106.1 kg   SpO2 96%   BMI 37.77 kg/m   Visual Acuity Right Eye Distance:   Left Eye Distance:   Bilateral Distance:    Right Eye Near:   Left Eye  Near:    Bilateral Near:     Physical Exam Vitals signs and nursing note reviewed.  Constitutional:      General: He is not in acute distress.    Appearance: He is well-developed. He is not toxic-appearing or diaphoretic.  HENT:     Head: Normocephalic and atraumatic.     Right Ear: Tympanic membrane, ear canal  and external ear normal.     Left Ear: Tympanic membrane, ear canal and external ear normal.     Nose:     Right Sinus: Maxillary sinus tenderness present.     Left Sinus: Maxillary sinus tenderness present.     Mouth/Throat:     Pharynx: Uvula midline. No oropharyngeal exudate or posterior oropharyngeal erythema.     Tonsils: No tonsillar exudate or tonsillar abscesses.  Eyes:     General: No scleral icterus.       Right eye: No discharge.        Left eye: No discharge.  Neck:     Musculoskeletal: Normal range of motion and neck supple.     Thyroid: No thyromegaly.     Trachea: No tracheal deviation.  Cardiovascular:     Rate and Rhythm: Normal rate and regular rhythm.     Heart sounds: Normal heart sounds.  Pulmonary:     Effort: Pulmonary effort is normal. No respiratory distress.     Breath sounds: Normal breath sounds. No stridor. No wheezing, rhonchi or rales.  Chest:     Chest wall: No tenderness.  Lymphadenopathy:     Cervical: No cervical adenopathy.  Skin:    General: Skin is warm and dry.     Findings: No rash.  Neurological:     Mental Status: He is alert.      UC Treatments / Results  Labs (all labs ordered are listed, but only abnormal results are displayed) Labs Reviewed - No data to display  EKG None  Radiology No results found.  Procedures Procedures (including critical care time)  Medications Ordered in UC Medications - No data to display  Initial Impression / Assessment and Plan / UC Course  I have reviewed the triage vital signs and the nursing notes.  Pertinent labs & imaging results that were available during my care of the  patient were reviewed by me and considered in my medical decision making (see chart for details).      Final Clinical Impressions(s) / UC Diagnoses   Final diagnoses:  Acute maxillary sinusitis, recurrence not specified  Cough    ED Prescriptions    Medication Sig Dispense Auth. Provider   doxycycline (VIBRA-TABS) 100 MG tablet Take 1 tablet (100 mg total) by mouth 2 (two) times daily. 20 tablet Norval Gable, MD   chlorpheniramine-HYDROcodone [facility] ER) 10-8 MG/5ML SUER Take 5 mLs by mouth every 12 (twelve) hours as needed. 60 mL Norval Gable, MD     1. diagnosis reviewed with patient 2. rx as per orders above; reviewed possible side effects, interactions, risks and benefits  3. Recommend supportive treatment with rest, fluids, otc flonase 4. Follow-up prn if symptoms worsen or don't improve  Controlled Substance Prescriptions  Controlled Substance Registry consulted? Not Applicable   Norval Gable, MD 06/13/18 [phone]

## 2018-07-02 ENCOUNTER — Encounter
Admission: RE | Disposition: A | Discharge status: Home / Self Care | Payer: Self-pay | Source: Home / Self Care | Attending: Gastroenterology

## 2018-07-02 ENCOUNTER — Other Ambulatory Visit: Payer: Self-pay

## 2018-07-02 ENCOUNTER — Ambulatory Visit: Payer: Medicare Other | Admitting: Family

## 2018-07-02 ENCOUNTER — Ambulatory Visit
Admission: RE | Admit: 2018-07-02 | Discharge: 2018-07-02 | Disposition: A | Discharge status: Home / Self Care | Payer: Medicare Other | Attending: Gastroenterology | Admitting: Gastroenterology

## 2018-07-02 DIAGNOSIS — E669 Obesity, unspecified: Secondary | ICD-10-CM | POA: Diagnosis not present

## 2018-07-02 DIAGNOSIS — D123 Benign neoplasm of transverse colon: Secondary | ICD-10-CM | POA: Diagnosis not present

## 2018-07-02 DIAGNOSIS — I2721 Secondary pulmonary arterial hypertension: Secondary | ICD-10-CM | POA: Insufficient documentation

## 2018-07-02 DIAGNOSIS — E119 Type 2 diabetes mellitus without complications: Secondary | ICD-10-CM | POA: Diagnosis not present

## 2018-07-02 DIAGNOSIS — Z79899 Other long term (current) drug therapy: Secondary | ICD-10-CM | POA: Diagnosis not present

## 2018-07-02 DIAGNOSIS — I10 Essential (primary) hypertension: Secondary | ICD-10-CM | POA: Insufficient documentation

## 2018-07-02 DIAGNOSIS — J449 Chronic obstructive pulmonary disease, unspecified: Secondary | ICD-10-CM | POA: Diagnosis not present

## 2018-07-02 DIAGNOSIS — G4730 Sleep apnea, unspecified: Secondary | ICD-10-CM | POA: Insufficient documentation

## 2018-07-02 DIAGNOSIS — Z6837 Body mass index (BMI) 37.0-37.9, adult: Secondary | ICD-10-CM | POA: Insufficient documentation

## 2018-07-02 DIAGNOSIS — Z7984 Long term (current) use of oral hypoglycemic drugs: Secondary | ICD-10-CM | POA: Insufficient documentation

## 2018-07-02 DIAGNOSIS — K5730 Diverticulosis of large intestine without perforation or abscess without bleeding: Secondary | ICD-10-CM | POA: Diagnosis not present

## 2018-07-02 DIAGNOSIS — Z86010 Personal history of colonic polyps: Secondary | ICD-10-CM | POA: Insufficient documentation

## 2018-07-02 DIAGNOSIS — Z09 Encounter for follow-up examination after completed treatment for conditions other than malignant neoplasm: Secondary | ICD-10-CM | POA: Insufficient documentation

## 2018-07-02 DIAGNOSIS — Z7951 Long term (current) use of inhaled steroids: Secondary | ICD-10-CM | POA: Diagnosis not present

## 2018-07-02 DIAGNOSIS — D124 Benign neoplasm of descending colon: Secondary | ICD-10-CM | POA: Diagnosis not present

## 2018-07-02 HISTORY — DX: Type 2 diabetes mellitus with diabetic mononeuropathy: E11.41

## 2018-07-02 HISTORY — DX: Angina pectoris, unspecified: I20.9

## 2018-07-02 HISTORY — PX: COLONOSCOPY WITH PROPOFOL: SHX5780

## 2018-07-02 LAB — GLUCOSE, CAPILLARY: Glucose-Capillary: 173 mg/dL — ABNORMAL HIGH (ref 70–99)

## 2018-07-02 SURGERY — COLONOSCOPY WITH PROPOFOL
Anesthesia: General

## 2018-07-02 MED ORDER — PROPOFOL 10 MG/ML IV BOLUS
INTRAVENOUS | Status: DC | PRN
  Administered 2018-07-02 (×2): 50 mg via INTRAVENOUS

## 2018-07-02 MED ORDER — PROPOFOL 500 MG/50ML IV EMUL
INTRAVENOUS | Status: DC | PRN
  Administered 2018-07-02 (×2): 130 ug/kg/min via INTRAVENOUS

## 2018-07-02 MED ORDER — PROPOFOL 500 MG/50ML IV EMUL
INTRAVENOUS | Status: AC
  Filled 2018-07-02: qty 50

## 2018-07-02 MED ORDER — SODIUM CHLORIDE 0.9 % IV SOLN
INTRAVENOUS | Status: DC
  Administered 2018-07-02: 1000 mL via INTRAVENOUS

## 2018-07-02 MED ORDER — LIDOCAINE HCL (PF) 1 % IJ SOLN
INTRAMUSCULAR | Status: AC
  Filled 2018-07-02: qty 2

## 2018-07-02 MED ORDER — PROPOFOL 10 MG/ML IV BOLUS
INTRAVENOUS | Status: AC
  Filled 2018-07-02: qty 20

## 2018-07-02 NOTE — Anesthesia Postprocedure Evaluation (Signed)
Anesthesia Post Note  Patient: Bobby Chen  Procedure(s) Performed: COLONOSCOPY WITH PROPOFOL (N/A )  Patient location during evaluation: Endoscopy Anesthesia Type: General Level of consciousness: awake and alert and oriented Pain management: pain level controlled Vital Signs Assessment: post-procedure vital signs reviewed and stable Respiratory status: spontaneous breathing Cardiovascular status: blood pressure returned to baseline Anesthetic complications: no     Last Vitals:  Vitals:   07/02/18 0913 07/02/18 0923  BP: (!) 147/77 (!) 145/70  Pulse:    Resp:    Temp:    SpO2: 100%     Last Pain:  Vitals:   07/02/18 0923  TempSrc:   PainSc: 0-No pain                 Jahvon Gosline

## 2018-07-02 NOTE — Op Note (Addendum)
[facility] Gastroenterology Patient Name: Bobby Chen Procedure Date: 07/02/2018 6:42 AM MRN: [id number] Account #: [id number] Date of Birth: 01-20-1946 Admit Type: Outpatient Age: 73 Room: [facility] ENDO ROOM 3 Gender: Male Note Status: Finalized Procedure:            Colonoscopy Indications:          Personal history of colonic polyps Providers:            Lollie Sails, MD Referring MD:         Ocie Cornfield. [name] MD, MD (Referring MD) Medicines:            Monitored Anesthesia Care Complications:        No immediate complications. Procedure:            Pre-Anesthesia Assessment:                       - ASA Grade Assessment: III - A patient with severe                        systemic disease.                       After obtaining informed consent, the colonoscope was                        passed under direct vision. Throughout the procedure,                        the patient's blood pressure, pulse, and oxygen                        saturations were monitored continuously. The                        Colonoscope was introduced through the anus and                        advanced to the the cecum, identified by appendiceal                        orifice and ileocecal valve. The colonoscopy was                        performed without difficulty. The patient tolerated the                        procedure well. The quality of the bowel preparation                        was good. Findings:      Multiple medium-mouthed diverticula were found in the sigmoid colon,       descending colon, transverse colon and ascending colon.      Three sessile polyps were found in the proximal descending colon. The       polyps were 2 to 4 mm in size. These polyps were removed with a cold       biopsy forceps. Resection and retrieval were complete.      A 1 mm polyp was found in the hepatic flexure. The polyp was sessile.       The polyp was removed with a cold  biopsy forceps.  Resection and       retrieval were complete.      The retroflexed view of the distal rectum and anal verge was normal and       showed no anal or rectal abnormalities.      The digital rectal exam was normal. Note mild enlargement of the       prostate. Impression:           - Diverticulosis in the sigmoid colon, in the                        descending colon, in the transverse colon and in the                        ascending colon.                       - Three 2 to 4 mm polyps in the proximal descending                        colon, removed with a cold biopsy forceps. Resected and                        retrieved.                       - One 1 mm polyp at the hepatic flexure, removed with a                        cold biopsy forceps. Resected and retrieved.                       - The distal rectum and anal verge are normal on                        retroflexion view. Recommendation:       - Discharge patient to home. Procedure Code(s):    --- Professional ---                       [phone], Colonoscopy, flexible; with biopsy, single or                        multiple Diagnosis Code(s):    --- Professional ---                       D12.4, Benign neoplasm of descending colon                       D12.3, Benign neoplasm of transverse colon (hepatic                        flexure or splenic flexure)                       Z86.010, Personal history of colonic polyps                       K57.30, Diverticulosis of large intestine without                        perforation or abscess without  bleeding CPT copyright 2018 [employer] Association. All rights reserved. The codes documented in this report are preliminary and upon coder review may  be revised to meet current compliance requirements. Lollie Sails, MD 07/02/2018 9:03:37 AM This report has been signed electronically. Number of Addenda: 0 Note Initiated On: 07/02/2018 6:42 AM Scope Withdrawal Time: 0 hours 9 minutes 41 seconds   Total Procedure Duration: 0 hours 26 minutes 49 seconds       [facility]

## 2018-07-02 NOTE — Anesthesia Procedure Notes (Signed)
Date/Time: 07/02/2018 8:14 AM Performed by: Gentry Fitz, CRNA Pre-anesthesia Checklist: Patient identified, Emergency Drugs available, Suction available, Patient being monitored and Timeout performed Patient Re-evaluated:Patient Re-evaluated prior to induction Oxygen Delivery Method: Nasal cannula Preoxygenation: Pre-oxygenation with 100% oxygen Induction Type: IV induction

## 2018-07-02 NOTE — Transfer of Care (Signed)
Immediate Anesthesia Transfer of Care Note  Patient: Bobby Chen  Procedure(s) Performed: COLONOSCOPY WITH PROPOFOL (N/A )  Patient Location: PACU  Anesthesia Type:General  Level of Consciousness: awake and drowsy  Airway & Oxygen Therapy: Patient Spontanous Breathing and Patient connected to nasal cannula oxygen  Post-op Assessment: Report given to RN and Post -op Vital signs reviewed and stable  Post vital signs: Reviewed and stable  Last Vitals:  Vitals Value Taken Time  BP 146/126 07/02/2018  8:55 AM  Temp 36.1 C 07/02/2018  8:53 AM  Pulse 65 07/02/2018  8:55 AM  Resp 18 07/02/2018  8:55 AM  SpO2 99 % 07/02/2018  8:55 AM  Vitals shown include unvalidated device data.  Last Pain:  Vitals:   07/02/18 0853  TempSrc: Tympanic  PainSc: Asleep         Complications: No apparent anesthesia complications

## 2018-07-02 NOTE — Anesthesia Post-op Follow-up Note (Signed)
Anesthesia QCDR form completed.        

## 2018-07-02 NOTE — H&P (Signed)
Outpatient short stay form Pre-procedure 07/02/2018 7:36 AM Lollie Sails MD  Primary Physician: Dr. Frazier Richards  Reason for visit: Colonoscopy  History of present illness: Patient is a 73 year old male presenting today for colonoscopy in regards to his personal history of adenomatous colon polyps.  He tolerated his prep well.  He takes no aspirin or blood thinning agent.    Current Facility-Administered Medications:  .  0.9 %  sodium chloride infusion, , Intravenous, Continuous, Lollie Sails, MD  Medications Prior to Admission  Medication Sig Dispense Refill Last Dose  . albuterol (PROVENTIL HFA;VENTOLIN HFA) 108 (90 Base) MCG/ACT inhaler Inhale 2 puffs into the lungs every 4 (four) hours as needed for wheezing or shortness of breath.   Past Week at Unknown time  . cetirizine-pseudoephedrine (ZYRTEC-D) 5-120 MG tablet Take 1 tablet by mouth daily. 30 tablet 0   . chlorpheniramine-HYDROcodone (TUSSIONEX PENNKINETIC ER) 10-8 MG/5ML SUER Take 5 mLs by mouth every 12 (twelve) hours as needed. 60 mL 0   . fluticasone (FLONASE) 50 MCG/ACT nasal spray Place 2 sprays into both nostrils daily.   Past Week at Unknown time  . metFORMIN (GLUCOPHAGE-XR) 500 MG 24 hr tablet Take 500 mg by mouth 2 (two) times daily.   06/26/2018  . Vitamin Mixture (ESTER-C PO) Take by mouth daily.   06/26/2018  . Bioflavonoid Products (ESTER C PO) Take 500 mg by mouth daily.   06/26/2018  . canagliflozin (INVOKANA) 300 MG TABS tablet Take 300 mg by mouth daily before breakfast.   06/26/2018  . Chromium 1 MG CAPS Take by mouth.   06/26/2018  . co-enzyme Q-10 30 MG capsule Take 10 mg by mouth daily.   06/26/2018  . COD LIVER OIL PO Take by mouth.   06/26/2018  . doxycycline (VIBRA-TABS) 100 MG tablet Take 1 tablet (100 mg total) by mouth 2 (two) times daily. 20 tablet 0   . fluticasone (FLONASE) 50 MCG/ACT nasal spray Place 2 sprays into both nostrils daily. 16 g 0   . glipiZIDE (GLUCOTROL XL) 5 MG 24 hr tablet Take  5 mg by mouth daily with breakfast.   06/26/2018  . Lysine 500 MG TABS Take by mouth daily.   06/26/2018  . metFORMIN (GLUCOPHAGE) 500 MG tablet Take 1,000 mg by mouth 2 (two) times daily with a meal.   06/26/2018  . Multiple Vitamin (MULTIVITAMIN) tablet Take 1 tablet by mouth daily.   06/26/2018  . OLIVE LEAF PO Take by mouth.   06/26/2018  . pantoprazole (PROTONIX) 40 MG tablet daily as needed.   06/26/2018  . pyridoxine (B-6) 100 MG tablet Take 100 mg by mouth daily.   06/26/2018  . saxagliptin HCl (ONGLYZA) 5 MG TABS tablet Take by mouth daily.   06/26/2018  . Selenium (RA SELENIUM) 200 MCG TABS Take by mouth.   06/26/2018  . simvastatin (ZOCOR) 40 MG tablet Take 40 mg by mouth daily.   06/26/2018  . sitaGLIPtin (JANUVIA) 100 MG tablet Take 1 tablet by mouth daily.   06/26/2018  . triamterene-hydrochlorothiazide (MAXZIDE-25) 37.5-25 MG tablet Take 1 tablet by mouth daily.   06/26/2018  . valsartan (DIOVAN) 160 MG tablet Take 160 mg by mouth daily.   06/26/2018  . vitamin E (VITAMIN E) 400 UNIT capsule Take 400 Units by mouth daily.   06/26/2018     Allergies  Allergen Reactions  . Amoxil [Amoxicillin] Nausea And Vomiting  . Buffered Aspirin     "knots" on the back of head  Past Medical History:  Diagnosis Date  . Anginal pain ([place])   . Brown recluse spider bite   . Cancer ([place])    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  . COPD (chronic obstructive pulmonary disease) ([place])   . Diabetes mellitus without complication ([place])   . 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 Elevated lipids   . Hypertension   . Obesity   . Pulmonary arterial hypertension ([place])   . Sleep apnea     Review of systems:      Physical Exam    Heart and lungs: Regular rate and rhythm without rub or gallop, lungs are bilaterally clear.    HEENT: Normocephalic atraumatic eyes are anicteric    Other:    Pertinant exam for procedure: Soft nontender nondistended bowel sounds positive normoactive    Planned proceedures: Colonoscopy and indicated  procedures.  Of note patient blood pressure-pulse was a bit low this morning.  KG tracing showed occasional rhythm irregularity.  A 12-lead EKG has been ordered for interpretation prior to the procedure.    Lollie Sails, MD Gastroenterology 07/02/2018  7:36 AM

## 2018-07-02 NOTE — Anesthesia Preprocedure Evaluation (Signed)
Anesthesia Evaluation  Patient identified by MRN, date of birth, ID band Patient awake    Reviewed: Allergy & Precautions, NPO status , Patient's Chart, lab work & pertinent test results  History of Anesthesia Complications Negative for: history of anesthetic complications  Airway Mallampati: II  TM Distance: >3 FB Neck ROM: Full    Dental no notable dental hx.    Pulmonary sleep apnea and Continuous Positive Airway Pressure Ventilation , COPD,  COPD inhaler,    breath sounds clear to auscultation- rhonchi (-) wheezing      Cardiovascular Exercise Tolerance: Good hypertension, Pt. on medications (-) CAD and (-) Past MI  Rhythm:Regular Rate:Normal - Systolic murmurs and - Diastolic murmurs    Neuro/Psych negative neurological ROS  negative psych ROS   GI/Hepatic negative GI ROS, Neg liver ROS,   Endo/Other  diabetes, Type 2, Oral Hypoglycemic Agents  Renal/GU negative Renal ROS     Musculoskeletal negative musculoskeletal ROS (+)   Abdominal (+) + obese,   Peds  Hematology negative hematology ROS (+)   Anesthesia Other Findings Past Medical History: No date: [place] recluse spider bite No date: COPD (chronic obstructive pulmonary disease) (* No date: Diabetes mellitus without complication (HCC) No date: Elevated lipids No date: Hypertension No date: Obesity No date: Pulmonary arterial hypertension (HCC) No date: Sleep apnea   Reproductive/Obstetrics                             Anesthesia Physical  Anesthesia Plan  ASA: III  Anesthesia Plan: General   Post-op Pain Management:    Induction: Intravenous  PONV Risk Score and Plan: Propofol infusion  Airway Management Planned: Natural Airway and Nasal Cannula  Additional Equipment:   Intra-op Plan:   Post-operative Plan:   Informed Consent: I have reviewed the patients History and Physical, chart, labs and discussed the  procedure including the risks, benefits and alternatives for the proposed anesthesia with the patient or authorized representative who has indicated his/her understanding and acceptance.     Dental advisory given  Plan Discussed with: CRNA and Anesthesiologist  Anesthesia Plan Comments:         Anesthesia Quick Evaluation

## 2018-07-03 LAB — SURGICAL PATHOLOGY

## 2018-07-04 ENCOUNTER — Encounter: Payer: Self-pay | Admitting: Gastroenterology

## 2018-08-20 DIAGNOSIS — R200 Anesthesia of skin: Secondary | ICD-10-CM | POA: Insufficient documentation

## 2018-08-20 DIAGNOSIS — R42 Dizziness and giddiness: Secondary | ICD-10-CM | POA: Insufficient documentation

## 2018-08-20 DIAGNOSIS — R202 Paresthesia of skin: Secondary | ICD-10-CM | POA: Insufficient documentation

## 2018-08-20 DIAGNOSIS — H938X2 Other specified disorders of left ear: Secondary | ICD-10-CM | POA: Insufficient documentation

## 2018-08-30 DIAGNOSIS — H2513 Age-related nuclear cataract, bilateral: Secondary | ICD-10-CM | POA: Insufficient documentation

## 2018-11-12 DIAGNOSIS — M25579 Pain in unspecified ankle and joints of unspecified foot: Secondary | ICD-10-CM | POA: Insufficient documentation

## 2019-01-16 ENCOUNTER — Encounter: Payer: Self-pay | Admitting: Emergency Medicine

## 2019-01-16 ENCOUNTER — Ambulatory Visit
Admission: EM | Admit: 2019-01-16 | Discharge: 2019-01-16 | Disposition: A | Discharge status: Home / Self Care | Payer: Medicare Other

## 2019-01-16 ENCOUNTER — Other Ambulatory Visit: Payer: Self-pay

## 2019-01-16 DIAGNOSIS — H6121 Impacted cerumen, right ear: Secondary | ICD-10-CM

## 2019-01-16 DIAGNOSIS — H6691 Otitis media, unspecified, right ear: Secondary | ICD-10-CM

## 2019-01-16 MED ORDER — CEFDINIR 300 MG PO CAPS: 300.0000 mg | ORAL_CAPSULE | Freq: Two times a day (BID) | ORAL | 0 refills | Status: AC

## 2019-01-16 NOTE — ED Triage Notes (Signed)
Pt c/o right ear pain. Started this morning. Denies fever.

## 2019-01-16 NOTE — ED Provider Notes (Signed)
[facility] ____________________________________________  Time seen: Approximately 6:02 PM  I have reviewed the triage vital signs and the nursing notes.   HISTORY  Chief Complaint Otalgia (right)   HPI Bobby Chen is a 73 y.o. male presenting for evaluation of intermittent right ear discomfort present since this morning.  States has a history of similar with previous ear infections.  States he is unsure if he has a wax buildup or he has an infection.  States pain is intermittent.  Denies accompanying cough, congestion, sore throat, fevers or other complaints.  States he did put a cotton ball with alcohol on his ear earlier which helps some as well as took a Tylenol.  Denies other alleviating measures.  Denies aggravating factors.  Denies hearing changes.  No drainage.  Reports otherwise doing well.  Reports he has a history of amoxicillin upset his stomach with nausea and vomiting, but states he has never had a rash or swelling.  States has tolerated all other antibiotics well.  Kirk Ruths, MD: PCP   Past Medical History:  Diagnosis Date  . Anginal pain ([place])   . Brown recluse spider bite   . Cancer ([place])    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  . COPD (chronic obstructive pulmonary disease) ([place])   . Diabetes mellitus without complication ([place])   . 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 Elevated lipids   . Hypertension   . Obesity   . Pulmonary arterial hypertension ([place])   . Sleep apnea     There are no active problems to display for this patient.   Past Surgical History:  Procedure Laterality Date  . ANKLE FRACTURE SURGERY    . BASAL CELL CARCINOMA EXCISION N/A    scalp  . COLONOSCOPY W/ POLYPECTOMY    . COLONOSCOPY WITH PROPOFOL N/A 01/18/2016   Procedure: COLONOSCOPY WITH PROPOFOL;  Surgeon: Lollie Sails, MD;  Location: [facility] ENDOSCOPY;  Service: Endoscopy;  Laterality: N/A;  . COLONOSCOPY WITH PROPOFOL N/A 07/02/2018   Procedure: COLONOSCOPY WITH PROPOFOL;  Surgeon:  Lollie Sails, MD;  Location: [facility] ENDOSCOPY;  Service: Endoscopy;  Laterality: N/A;  . ESOPHAGOGASTRODUODENOSCOPY (EGD) WITH PROPOFOL N/A 01/18/2016   Procedure: ESOPHAGOGASTRODUODENOSCOPY (EGD) WITH PROPOFOL;  Surgeon: Lollie Sails, MD;  Location: [facility] ENDOSCOPY;  Service: Endoscopy;  Laterality: N/A;  . FRACTURE SURGERY Right 1984   ankle  . HERNIA REPAIR    . toenail removal Bilateral      No current facility-administered medications for this encounter.   Current Outpatient Medications:  .  albuterol (PROVENTIL HFA;VENTOLIN HFA) 108 (90 Base) MCG/ACT inhaler, Inhale 2 puffs into the lungs every 4 (four) hours as needed for wheezing or shortness of breath., Disp: , Rfl:  .  canagliflozin (INVOKANA) 300 MG TABS tablet, Take 300 mg by mouth daily before breakfast., Disp: , Rfl:  .  cetirizine-pseudoephedrine (ZYRTEC-D) 5-120 MG tablet, Take 1 tablet by mouth daily., Disp: 30 tablet, Rfl: 0 .  Chromium 1 MG CAPS, Take by mouth., Disp: , Rfl:  .  Exenatide ER (BYDUREON BCISE) 2 MG/0.85ML AUIJ, Inject into the skin., Disp: , Rfl:  .  fluticasone (FLONASE) 50 MCG/ACT nasal spray, Place 2 sprays into both nostrils daily., Disp: , Rfl:  .  glipiZIDE (GLUCOTROL XL) 5 MG 24 hr tablet, Take 5 mg by mouth daily with breakfast., Disp: , Rfl:  .  Lysine 500 MG TABS, Take by mouth daily., Disp: , Rfl:  .  metFORMIN (GLUCOPHAGE-XR) 500 MG 24 hr tablet, Take 500  mg by mouth 2 (two) times daily., Disp: , Rfl:  .  Multiple Vitamin (MULTIVITAMIN) tablet, Take 1 tablet by mouth daily., Disp: , Rfl:  .  pantoprazole (PROTONIX) 40 MG tablet, daily as needed., Disp: , Rfl:  .  simvastatin (ZOCOR) 40 MG tablet, Take 40 mg by mouth daily., Disp: , Rfl:  .  sitaGLIPtin (JANUVIA) 100 MG tablet, Take 1 tablet by mouth daily., Disp: , Rfl:  .  triamterene-hydrochlorothiazide (MAXZIDE-25) 37.5-25 MG tablet, Take 1 tablet by mouth daily., Disp: , Rfl:  .  valsartan (DIOVAN) 160 MG tablet, Take 160 mg by mouth  daily., Disp: , Rfl:  .  Vitamin Mixture (ESTER-C PO), Take by mouth daily., Disp: , Rfl:  .  Bioflavonoid Products (ESTER C PO), Take 500 mg by mouth daily., Disp: , Rfl:  .  cefdinir (OMNICEF) 300 MG capsule, Take 1 capsule (300 mg total) by mouth 2 (two) times daily for 10 days., Disp: 20 capsule, Rfl: 0 .  chlorpheniramine-HYDROcodone (TUSSIONEX PENNKINETIC ER) 10-8 MG/5ML SUER, Take 5 mLs by mouth every 12 (twelve) hours as needed., Disp: 60 mL, Rfl: 0 .  co-enzyme Q-10 30 MG capsule, Take 10 mg by mouth daily., Disp: , Rfl:  .  COD LIVER OIL PO, Take by mouth., Disp: , Rfl:  .  doxycycline (VIBRA-TABS) 100 MG tablet, Take 1 tablet (100 mg total) by mouth 2 (two) times daily., Disp: 20 tablet, Rfl: 0 .  fluticasone (FLONASE) 50 MCG/ACT nasal spray, Place 2 sprays into both nostrils daily., Disp: 16 g, Rfl: 0 .  metFORMIN (GLUCOPHAGE) 500 MG tablet, Take 1,000 mg by mouth 2 (two) times daily with a meal., Disp: , Rfl:  .  OLIVE LEAF PO, Take by mouth., Disp: , Rfl:  .  pyridoxine (B-6) 100 MG tablet, Take 100 mg by mouth daily., Disp: , Rfl:  .  saxagliptin HCl (ONGLYZA) 5 MG TABS tablet, Take by mouth daily., Disp: , Rfl:  .  Selenium (RA SELENIUM) 200 MCG TABS, Take by mouth., Disp: , Rfl:  .  vitamin E (VITAMIN E) 400 UNIT capsule, Take 400 Units by mouth daily., Disp: , Rfl:   Allergies Amoxil [amoxicillin] and Buffered aspirin  Family History  Problem Relation Age of Onset  . Ovarian cancer Mother   . Ulcers Father     Social History Social History   Tobacco Use  . Smoking status: Never Smoker  . Smokeless tobacco: Never Used  Substance Use Topics  . Alcohol use: No  . Drug use: No    Review of Systems Constitutional: No fever ENT: No sore throat. As above.  Cardiovascular: Denies chest pain. Respiratory: Denies shortness of breath. Gastrointestinal: No abdominal pain.   Musculoskeletal: Negative for back pain. Skin: Negative for rash. Neurological: Negative for  focal weakness or numbness.    ____________________________________________   PHYSICAL EXAM:  VITAL SIGNS: ED Triage Vitals  Enc Vitals Group     BP 01/16/19 1727 121/65     Pulse Rate 01/16/19 1727 60     Resp 01/16/19 1727 18     Temp 01/16/19 1727 98.2 F (36.8 C)     Temp Source 01/16/19 1727 Oral     SpO2 01/16/19 1727 97 %     Weight 01/16/19 1721 229 lb (103.9 kg)     Height 01/16/19 1721 5' 6.5" (1.689 m)     Head Circumference --      Peak Flow --      Pain Score 01/16/19 1720  8     Pain Loc --      Pain Edu? --      Excl. in [place]? --     Constitutional: Alert and oriented. Well appearing and in no acute distress. Eyes: Conjunctivae are normal.  Head: Atraumatic. No sinus tenderness to palpation. No swelling. No erythema.  Ears: No mastoid tenderness bilaterally.  Left: Nontender, normal canal, no erythema, normal TM.  Right: Nontender, canal obstructed by cerumen, post cerumen removal, canal clear, moderate TM erythema with dull TM.  Nose:No nasal congestion  Mouth/Throat: Mucous membranes are moist. No pharyngeal erythema. No tonsillar swelling or exudate.  Neck: No stridor.  No cervical spine tenderness to palpation. Hematological/Lymphatic/Immunilogical: No cervical lymphadenopathy. Cardiovascular: Normal rate, regular rhythm. Grossly normal heart sounds.  Good peripheral circulation. Respiratory: Normal respiratory effort.  No retractions. No wheezes, rales or rhonchi. Good air movement.  Musculoskeletal: Ambulatory with steady gait.  Neurologic:  Normal speech and language. No gait instability. Skin:  Skin appears warm, dry and intact. No rash noted. Psychiatric: Mood and affect are normal. Speech and behavior are normal. ___________________________________________   LABS (all labs ordered are listed, but only abnormal results are displayed)  Labs Reviewed - No data to display  PROCEDURES Procedures   Right ear cerumen impaction removed by myself  with curette.  Patient tolerated well and reports improved discomfort post removal.  INITIAL IMPRESSION / ASSESSMENT AND PLAN / ED COURSE  Pertinent labs & imaging results that were available during my care of the patient were reviewed by me and considered in my medical decision making (see chart for details).  Well-appearing patient.  No acute distress.  Right otalgia.  Right cerumen impaction removed with curette.  Right otitis media.  Will treat with oral cefdinir.  Patient reports only having previous GI upset with amoxicillin, no other reaction has tolerated other antibiotics well.  Supportive care, Tylenol as needed.Discussed indication, risks and benefits of medications with patient.  Discussed follow up and return parameters including no resolution or any worsening concerns. Patient verbalized understanding and agreed to plan.   ____________________________________________   FINAL CLINICAL IMPRESSION(S) / ED DIAGNOSES  Final diagnoses:  Right otitis media, unspecified otitis media type  Impacted cerumen of right ear     ED Discharge Orders         Ordered    cefdinir (OMNICEF) 300 MG capsule  2 times daily     01/16/19 1754           Note: This dictation was prepared with Dragon dictation along with smaller phrase technology. Any transcriptional errors that result from this process are unintentional.         Marylene Land, NP 01/16/19 1807

## 2019-01-16 NOTE — Discharge Instructions (Addendum)
Take medication as prescribed. Monitor.  °Follow up with your primary care physician this week as needed. Return to Urgent care for new or worsening concerns.  ° °

## 2019-03-11 DIAGNOSIS — N1831 Chronic kidney disease, stage 3a: Secondary | ICD-10-CM | POA: Insufficient documentation

## 2019-03-11 DIAGNOSIS — E1122 Type 2 diabetes mellitus with diabetic chronic kidney disease: Secondary | ICD-10-CM | POA: Insufficient documentation

## 2019-03-11 DIAGNOSIS — E66812 Obesity, class 2: Secondary | ICD-10-CM | POA: Insufficient documentation

## 2019-03-11 DIAGNOSIS — E669 Obesity, unspecified: Secondary | ICD-10-CM | POA: Insufficient documentation

## 2019-05-06 DIAGNOSIS — G4733 Obstructive sleep apnea (adult) (pediatric): Secondary | ICD-10-CM | POA: Insufficient documentation

## 2019-08-14 DIAGNOSIS — I739 Peripheral vascular disease, unspecified: Secondary | ICD-10-CM | POA: Insufficient documentation

## 2019-08-26 ENCOUNTER — Ambulatory Visit (INDEPENDENT_AMBULATORY_CARE_PROVIDER_SITE_OTHER): Payer: Medicare Other | Admitting: Vascular Surgery

## 2019-08-26 ENCOUNTER — Other Ambulatory Visit: Payer: Self-pay

## 2019-08-26 ENCOUNTER — Encounter (INDEPENDENT_AMBULATORY_CARE_PROVIDER_SITE_OTHER): Payer: Self-pay

## 2019-08-26 ENCOUNTER — Encounter (INDEPENDENT_AMBULATORY_CARE_PROVIDER_SITE_OTHER): Payer: Self-pay | Admitting: Vascular Surgery

## 2019-08-26 VITALS — BP 127/69 | HR 61 | Resp 16 | Ht 67.0 in | Wt 229.0 lb

## 2019-08-26 DIAGNOSIS — I739 Peripheral vascular disease, unspecified: Secondary | ICD-10-CM

## 2019-08-26 DIAGNOSIS — M79604 Pain in right leg: Secondary | ICD-10-CM | POA: Diagnosis not present

## 2019-08-26 DIAGNOSIS — E1121 Type 2 diabetes mellitus with diabetic nephropathy: Secondary | ICD-10-CM

## 2019-08-26 DIAGNOSIS — E1122 Type 2 diabetes mellitus with diabetic chronic kidney disease: Secondary | ICD-10-CM

## 2019-08-26 DIAGNOSIS — I872 Venous insufficiency (chronic) (peripheral): Secondary | ICD-10-CM | POA: Insufficient documentation

## 2019-08-26 DIAGNOSIS — I10 Essential (primary) hypertension: Secondary | ICD-10-CM

## 2019-08-26 DIAGNOSIS — N1831 Chronic kidney disease, stage 3a: Secondary | ICD-10-CM

## 2019-08-26 NOTE — Progress Notes (Signed)
MRN : [id number]  Bobby Chen is a 74 y.o. (August 20, 1945) male who presents with chief complaint of  Chief Complaint  Patient presents with  . New Patient (Initial Visit)    ref Bobby Chen PVD  .  History of Present Illness:   The patient is seen for evaluation of painful lower extremities. Patient notes the pain is variable and not always associated with activity.  The pain is somewhat consistent day to day occurring on most days.  He notes that the pain is located in the ankle he sustained extensive trauma remotely with fracture of the right tibia-fibula and talus secondary to a skiing accident.  It is his opinion that since that time his leg has become increasingly problematic.  He does mention that he was seen at a screening and told that he has poor circulation in the right leg.  The patient notes the pain also occurs with standing and routinely seems worse as the day wears on. The pain has been progressive over the past several years. The patient states these symptoms are causing  a profound negative impact on quality of life and daily activities.  The patient denies rest pain or dangling of an extremity off the side of the bed during the night for relief. No open wounds or sores at this time. No history of DVT or phlebitis. No prior interventions or surgeries.  There is a  history of back problems and DJD of the lumbar and sacral spine.    Current Meds  Medication Sig  . Bioflavonoid Products (ESTER C PO) Take 500 mg by mouth daily.  Marland Kitchen BLACK ELDERBERRY PO Take by mouth.  . cetirizine-pseudoephedrine (ZYRTEC-D) 5-120 MG tablet Take 1 tablet by mouth daily.  . chlorpheniramine-HYDROcodone (TUSSIONEX PENNKINETIC ER) 10-8 MG/5ML SUER Take 5 mLs by mouth every 12 (twelve) hours as needed.  . Chromium 1 MG CAPS Take by mouth.  . co-enzyme Q-10 30 MG capsule Take 10 mg by mouth daily.  . COD LIVER OIL PO Take by mouth.  . Exenatide ER (BYDUREON BCISE) 2 MG/0.85ML AUIJ Inject into the  skin.  . fluticasone (FLONASE) 50 MCG/ACT nasal spray Place 2 sprays into both nostrils daily.  . Garlic 123XX123 MG CAPS Take by mouth.  Marland Kitchen glipiZIDE (GLUCOTROL XL) 5 MG 24 hr tablet Take 5 mg by mouth daily with breakfast.  . Lysine 500 MG TABS Take by mouth daily.  . Magnesium 250 MG TABS Take by mouth daily.  . metFORMIN (GLUCOPHAGE-XR) 500 MG 24 hr tablet Take 500 mg by mouth 2 (two) times daily.  . montelukast (SINGULAIR) 10 MG tablet Take 10 mg by mouth daily.  . Multiple Vitamin (MULTIVITAMIN) tablet Take 1 tablet by mouth daily.  Marland Kitchen OLIVE LEAF PO Take by mouth.  . Omega-3 Fatty Acids (FISH OIL) 1200 MG CAPS Take by mouth.  . pantoprazole (PROTONIX) 40 MG tablet daily as needed.  Marland Kitchen PEPPERMINT OIL PO Take by mouth.  . pyridoxine (B-6) 100 MG tablet Take 100 mg by mouth daily.  . simvastatin (ZOCOR) 40 MG tablet Take 40 mg by mouth daily.  . sitaGLIPtin (JANUVIA) 100 MG tablet Take 1 tablet by mouth daily.  Marland Kitchen triamterene-hydrochlorothiazide (MAXZIDE-25) 37.5-25 MG tablet Take 1 tablet by mouth daily.  . valsartan (DIOVAN) 160 MG tablet Take 160 mg by mouth daily.  . vitamin B-12 (CYANOCOBALAMIN) 1000 MCG tablet Take 1,000 mcg by mouth daily.  . vitamin E (VITAMIN E) 400 UNIT capsule Take 400 Units by mouth  daily.  . Vitamin Mixture (ESTER-C PO) Take by mouth daily.    Past Medical History:  Diagnosis Date  . Anginal pain ([place])   . Brown recluse spider bite   . Cancer ([place])    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  . COPD (chronic obstructive pulmonary disease) ([place])   . Diabetes mellitus without complication ([place])   . 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 Elevated lipids   . Hypertension   . Obesity   . Pulmonary arterial hypertension ([place])   . Sleep apnea     Past Surgical History:  Procedure Laterality Date  . ANKLE FRACTURE SURGERY    . BASAL CELL CARCINOMA EXCISION N/A    scalp  . COLONOSCOPY W/ POLYPECTOMY    . COLONOSCOPY WITH PROPOFOL N/A 01/18/2016   Procedure: COLONOSCOPY WITH PROPOFOL;   Surgeon: Lollie Sails, MD;  Location: [facility] ENDOSCOPY;  Service: Endoscopy;  Laterality: N/A;  . COLONOSCOPY WITH PROPOFOL N/A 07/02/2018   Procedure: COLONOSCOPY WITH PROPOFOL;  Surgeon: Lollie Sails, MD;  Location: [facility] ENDOSCOPY;  Service: Endoscopy;  Laterality: N/A;  . ESOPHAGOGASTRODUODENOSCOPY (EGD) WITH PROPOFOL N/A 01/18/2016   Procedure: ESOPHAGOGASTRODUODENOSCOPY (EGD) WITH PROPOFOL;  Surgeon: Lollie Sails, MD;  Location: [facility] ENDOSCOPY;  Service: Endoscopy;  Laterality: N/A;  . FRACTURE SURGERY Right 1984   ankle  . HERNIA REPAIR    . toenail removal Bilateral     Social History Social History   Tobacco Use  . Smoking status: Never Smoker  . Smokeless tobacco: Never Used  Substance Use Topics  . Alcohol use: No  . Drug use: No    Family History Family History  Problem Relation Age of Onset  . Ovarian cancer Mother   . Ulcers Father   No family history of bleeding/clotting disorders, porphyria or autoimmune disease   Allergies  Allergen Reactions  . Amoxil [Amoxicillin] Nausea And Vomiting  . Aspirin Buffered     Other reaction(s): Other (See Comments)  . Buffered Aspirin     "knots" on the back of head     REVIEW OF SYSTEMS (Negative unless checked)  Constitutional: [] Weight loss  [] Fever  [] Chills Cardiac: [] Chest pain   [] Chest pressure   [] Palpitations   [] Shortness of breath when laying flat   [] Shortness of breath with exertion. Vascular:  [] Pain in legs with walking   [] Pain in legs at rest  [] History of DVT   [] Phlebitis   [x] Swelling in legs   [x] Varicose veins   [] Non-healing ulcers Pulmonary:   [] Uses home oxygen   [] Productive cough   [] Hemoptysis   [] Wheeze  [] COPD   [] Asthma Neurologic:  [] Dizziness   [] Seizures   [] History of stroke   [] History of TIA  [] Aphasia   [] Vissual changes   [] Weakness or numbness in arm   [x] Weakness or numbness in leg Musculoskeletal:   [x] Joint swelling   [x] Joint pain   [] Low back pain Hematologic:   [] Easy bruising  [] Easy bleeding   [] Hypercoagulable state   [] Anemic Gastrointestinal:  [] Diarrhea   [] Vomiting  [] Gastroesophageal reflux/heartburn   [] Difficulty swallowing. Genitourinary:  [] Chronic kidney disease   [] Difficult urination  [] Frequent urination   [] Blood in urine Skin:  [x] Rashes   [] Ulcers  Psychological:  [] History of anxiety   []  History of major depression.  Physical Examination  Vitals:   08/26/19 0824  BP: 127/69  Pulse: 61  Resp: 16  Weight: 229 lb (103.9 kg)  Height: 5\' 7"  (1.702 m)   Body mass index is 35.87 kg/m. Gen:  WD/WN, NAD Head: [place]/AT, No temporalis wasting.  Ear/Nose/Throat: Hearing grossly intact, nares w/o erythema or drainage, poor dentition Eyes: PER, EOMI, sclera nonicteric.  Neck: Supple, no masses.  No bruit or JVD.  Pulmonary:  Good air movement, clear to auscultation bilaterally, no use of accessory muscles.  Cardiac: RRR, normal S1, S2, no Murmurs. Vascular: scattered varicosities present bilaterally.  Moderate venous stasis changes to the legs bilaterally.  2-3+ soft pitting edema right > left Vessel Right Left  Radial Palpable Palpable  PT 1+ Palpable 2+ Palpable  DP Not Palpable Not Palpable  Gastrointestinal: soft, non-distended. No guarding/no peritoneal signs.  Musculoskeletal: M/S 5/5 throughout.  No deformity or atrophy.  Neurologic: CN 2-12 intact. Pain and light touch intact in extremities.  Symmetrical.  Speech is fluent. Motor exam as listed above. Psychiatric: Judgment intact, Mood & affect appropriate for pt's clinical situation. Dermatologic: Moderate venous rashesrashes no ulcers noted.  No changes consistent with cellulitis. Lymph : No Cervical lymphadenopathy, no lichenification or skin changes of chronic lymphedema.  CBC No results found for: WBC, HGB, HCT, MCV, PLT  BMET No results found for: NA, K, CL, CO2, GLUCOSE, BUN, CREATININE, CALCIUM, GFRNONAA, GFRAA CrCl cannot be calculated (No successful lab value  found.).  COAG No results found for: INR, PROTIME  Radiology No results found.   Assessment/Plan 1. Leg pain, right  Recommend:  The patient has atypical pain symptoms for pure atherosclerotic disease. However, on physical exam there is evidence of mixed venous and arterial disease, given the diminished pulses and the edema associated with venous changes of the legs.  Noninvasive studies including ABI's and venous ultrasound of the legs will be obtained and the patient will follow up with me to review these studies.  I suspect the patient is c/o pseudoclaudication.  Patient should have an evaluation of his LS spine which I defer to the primary service.  The patient should continue walking and begin a more formal exercise program. The patient should continue his antiplatelet therapy and aggressive treatment of the lipid abnormalities.  The patient should begin wearing graduated compression socks 15-20 mmHg strength to control edema.  - VAS [place] ABI WITH/WO TBI; Future - VAS [place] LOWER EXTREMITY VENOUS REFLUX; Future  2. Chronic venous insufficiency  Recommend:  The patient has atypical pain symptoms for pure atherosclerotic disease. However, on physical exam there is evidence of mixed venous and arterial disease, given the diminished pulses and the edema associated with venous changes of the legs.  Noninvasive studies including ABI's and venous ultrasound of the legs will be obtained and the patient will follow up with me to review these studies.  I suspect the patient is c/o pseudoclaudication.  Patient should have an evaluation of his LS spine which I defer to the primary service.  The patient should continue walking and begin a more formal exercise program. The patient should continue his antiplatelet therapy and aggressive treatment of the lipid abnormalities.  The patient should begin wearing graduated compression socks 15-20 mmHg strength to control edema.  - VAS [place] LOWER  EXTREMITY VENOUS REFLUX; Future  3. PAD (peripheral artery disease) (HCC)  Recommend:  The patient has atypical pain symptoms for pure atherosclerotic disease. However, on physical exam there is evidence of mixed venous and arterial disease, given the diminished pulses and the edema associated with venous changes of the legs.  Noninvasive studies including ABI's and venous ultrasound of the legs will be obtained and the patient will follow up with me to  review these studies.  I suspect the patient is c/o pseudoclaudication.  Patient should have an evaluation of his LS spine which I defer to the primary service.  The patient should continue walking and begin a more formal exercise program. The patient should continue his antiplatelet therapy and aggressive treatment of the lipid abnormalities.  The patient should begin wearing graduated compression socks 15-20 mmHg strength to control edema.  - VAS [place] ABI WITH/WO TBI; Future  4. Essential hypertension with goal blood pressure less than 130/80 Continue antihypertensive medications as already ordered, these medications have been reviewed and there are no changes at this time.   5. Type 2 diabetes mellitus with stage 3a chronic kidney disease, without long-term current use of insulin (HCC) Continue hypoglycemic medications as already ordered, these medications have been reviewed and there are no changes at this time.  Hgb A1C to be monitored as already arranged by primary service    Hortencia Pilar, MD  08/26/2019 9:04 AM

## 2019-09-19 ENCOUNTER — Ambulatory Visit (INDEPENDENT_AMBULATORY_CARE_PROVIDER_SITE_OTHER): Payer: Medicare Other

## 2019-09-19 ENCOUNTER — Encounter (INDEPENDENT_AMBULATORY_CARE_PROVIDER_SITE_OTHER): Payer: Self-pay | Admitting: Vascular Surgery

## 2019-09-19 ENCOUNTER — Ambulatory Visit (INDEPENDENT_AMBULATORY_CARE_PROVIDER_SITE_OTHER): Payer: Medicare Other | Admitting: Vascular Surgery

## 2019-09-19 ENCOUNTER — Other Ambulatory Visit: Payer: Self-pay

## 2019-09-19 VITALS — BP 130/68 | HR 58 | Ht 66.0 in | Wt 230.0 lb

## 2019-09-19 DIAGNOSIS — E1122 Type 2 diabetes mellitus with diabetic chronic kidney disease: Secondary | ICD-10-CM

## 2019-09-19 DIAGNOSIS — E1121 Type 2 diabetes mellitus with diabetic nephropathy: Secondary | ICD-10-CM | POA: Diagnosis not present

## 2019-09-19 DIAGNOSIS — I872 Venous insufficiency (chronic) (peripheral): Secondary | ICD-10-CM

## 2019-09-19 DIAGNOSIS — M79604 Pain in right leg: Secondary | ICD-10-CM

## 2019-09-19 DIAGNOSIS — I739 Peripheral vascular disease, unspecified: Secondary | ICD-10-CM

## 2019-09-19 DIAGNOSIS — I10 Essential (primary) hypertension: Secondary | ICD-10-CM | POA: Diagnosis not present

## 2019-09-19 DIAGNOSIS — N1831 Chronic kidney disease, stage 3a: Secondary | ICD-10-CM

## 2019-09-21 ENCOUNTER — Encounter (INDEPENDENT_AMBULATORY_CARE_PROVIDER_SITE_OTHER): Payer: Self-pay | Admitting: Vascular Surgery

## 2019-09-21 NOTE — Progress Notes (Signed)
MRN : [id number]  LENYN WEEBER is a 74 y.o. (1946-04-20) male who presents with chief complaint of  Chief Complaint  Patient presents with  . Follow-up    U/S Follow up  .  History of Present Illness:   The patient returns to the office for followup evaluation regarding leg swelling.  The swelling has improved quite a bit and the pain associated with swelling has decreased substantially. There have not been any interval development of a ulcerations or wounds.  Since the previous visit the patient has been wearing graduated compression stockings and has noted little significant improvement in the lymphedema. The patient has been using compression routinely morning until night.  The patient also states elevation during the day and exercise is being done too.  Venous duplex shows deep reflux and some superficial reflux  ABI's are normal   Current Meds  Medication Sig  . albuterol (PROVENTIL HFA;VENTOLIN HFA) 108 (90 Base) MCG/ACT inhaler Inhale 2 puffs into the lungs every 4 (four) hours as needed for wheezing or shortness of breath.  Marland Kitchen Bioflavonoid Products (ESTER C PO) Take 500 mg by mouth daily.  . cetirizine-pseudoephedrine (ZYRTEC-D) 5-120 MG tablet Take 1 tablet by mouth daily.  . Exenatide ER (BYDUREON BCISE) 2 MG/0.85ML AUIJ Inject 2 mg into the skin every Friday.   . fluticasone (FLONASE) 50 MCG/ACT nasal spray Place 2 sprays into both nostrils daily.  Marland Kitchen glipiZIDE (GLUCOTROL XL) 5 MG 24 hr tablet Take 5 mg by mouth daily with breakfast.  . metFORMIN (GLUCOPHAGE-XR) 500 MG 24 hr tablet Take 1,000 mg by mouth 2 (two) times daily.   . pantoprazole (PROTONIX) 40 MG tablet Take 40 mg by mouth daily as needed (heartburn).   . simvastatin (ZOCOR) 40 MG tablet Take 40 mg by mouth daily.  Marland Kitchen triamterene-hydrochlorothiazide (MAXZIDE-25) 37.5-25 MG tablet Take 1 tablet by mouth daily.  . valsartan (DIOVAN) 160 MG tablet Take 160 mg by mouth daily.  Marland Kitchen VITAMIN A PO Take 1 tablet by  mouth daily.    Past Medical History:  Diagnosis Date  . Anginal pain ([place])   . Brown recluse spider bite   . Cancer ([place])    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  . COPD (chronic obstructive pulmonary disease) ([place])   . Diabetes mellitus without complication ([place])   . 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 Elevated lipids   . Hypertension   . Obesity   . Pulmonary arterial hypertension ([place])   . Sleep apnea     Past Surgical History:  Procedure Laterality Date  . ANKLE FRACTURE SURGERY    . BASAL CELL CARCINOMA EXCISION N/A    scalp  . COLONOSCOPY W/ POLYPECTOMY    . COLONOSCOPY WITH PROPOFOL N/A 01/18/2016   Procedure: COLONOSCOPY WITH PROPOFOL;  Surgeon: Lollie Sails, MD;  Location: [facility] ENDOSCOPY;  Service: Endoscopy;  Laterality: N/A;  . COLONOSCOPY WITH PROPOFOL N/A 07/02/2018   Procedure: COLONOSCOPY WITH PROPOFOL;  Surgeon: Lollie Sails, MD;  Location: [facility] ENDOSCOPY;  Service: Endoscopy;  Laterality: N/A;  . ESOPHAGOGASTRODUODENOSCOPY (EGD) WITH PROPOFOL N/A 01/18/2016   Procedure: ESOPHAGOGASTRODUODENOSCOPY (EGD) WITH PROPOFOL;  Surgeon: Lollie Sails, MD;  Location: [facility] ENDOSCOPY;  Service: Endoscopy;  Laterality: N/A;  . FRACTURE SURGERY Right 1984   ankle  . HERNIA REPAIR    . toenail removal Bilateral     Social History Social History   Tobacco Use  . Smoking status: Never Smoker  . Smokeless tobacco: Never Used  Substance Use Topics  .  Alcohol use: No  . Drug use: No    Family History Family History  Problem Relation Age of Onset  . Ovarian cancer Mother   . Ulcers Father     Allergies  Allergen Reactions  . Amoxil [Amoxicillin] Nausea And Vomiting  . Aspirin Buffered     Other reaction(s): Other (See Comments)  . Buffered Aspirin     "knots" on the back of head     REVIEW OF SYSTEMS (Negative unless checked)  Constitutional: [] Weight loss  [] Fever  [] Chills Cardiac: [] Chest pain   [] Chest pressure   [] Palpitations   [] Shortness of breath when laying  flat   [] Shortness of breath with exertion. Vascular:  [] Pain in legs with walking   [] Pain in legs at rest  [] History of DVT   [] Phlebitis   [] Swelling in legs   [] Varicose veins   [] Non-healing ulcers Pulmonary:   [] Uses home oxygen   [] Productive cough   [] Hemoptysis   [] Wheeze  [] COPD   [] Asthma Neurologic:  [] Dizziness   [] Seizures   [] History of stroke   [] History of TIA  [] Aphasia   [] Vissual changes   [] Weakness or numbness in arm   [] Weakness or numbness in leg Musculoskeletal:   [] Joint swelling   [] Joint pain   [] Low back pain Hematologic:  [] Easy bruising  [] Easy bleeding   [] Hypercoagulable state   [] Anemic Gastrointestinal:  [] Diarrhea   [] Vomiting  [] Gastroesophageal reflux/heartburn   [] Difficulty swallowing. Genitourinary:  [] Chronic kidney disease   [] Difficult urination  [] Frequent urination   [] Blood in urine Skin:  [] Rashes   [] Ulcers  Psychological:  [] History of anxiety   []  History of major depression.  Physical Examination  Vitals:   09/19/19 1556  BP: 130/68  Pulse: (!) 58  Weight: 230 lb (104.3 kg)  Height: 5\' 6"  (1.676 m)   Body mass index is 37.12 kg/m. Gen: WD/WN, NAD Head: [place]/AT, No temporalis wasting.  Ear/Nose/Throat: Hearing grossly intact, nares w/o erythema or drainage Eyes: PER, EOMI, sclera nonicteric.  Neck: Supple, no large masses.   Pulmonary:  Good air movement, no audible wheezing bilaterally, no use of accessory muscles.  Cardiac: RRR, no JVD Vascular: scattered varicosities present bilaterally.  Mild venous stasis changes to the legs bilaterally.  2+ soft pitting edema Vessel Right Left  Radial Palpable Palpable  PT Palpable Palpable  DP Palpable Palpable  Gastrointestinal: Non-distended. No guarding/no peritoneal signs.  Musculoskeletal: M/S 5/5 throughout.  No deformity or atrophy.  Neurologic: CN 2-12 intact. Symmetrical.  Speech is fluent. Motor exam as listed above. Psychiatric: Judgment intact, Mood & affect appropriate for pt's  clinical situation. Dermatologic: No rashes or ulcers noted.  No changes consistent with cellulitis.  CBC No results found for: WBC, HGB, HCT, MCV, PLT  BMET No results found for: NA, K, CL, CO2, GLUCOSE, BUN, CREATININE, CALCIUM, GFRNONAA, GFRAA CrCl cannot be calculated (No successful lab value found.).  COAG No results found for: INR, PROTIME  Radiology No results found.   Assessment/Plan 1. Chronic venous insufficiency No surgery or intervention at this point in time.  I have reviewed my discussion with the patient regarding venous insufficiency and why it causes symptoms. I have discussed with the patient the chronic skin changes that accompany venous insufficiency and the long term sequela such as ulceration. Patient will contnue wearing graduated compression stockings on a daily basis, as this has provided excellent control of his edema. The patient will put the stockings on first thing in the morning and removing them  in the evening. The patient is reminded not to sleep in the stockings.  In addition, behavioral modification including elevation during the day will be initiated. Exercise is strongly encouraged.  Given the patient's good control and lack of any problems regarding the venous insufficiency and lymphedema a lymph pump in not need at this time.  The patient will follow up with me PRN should anything change.  The patient voices agreement with this plan.   2. PAD (peripheral artery disease) (HCC) Recommend:  I do not find evidence of life style limiting vascular disease. The patient specifically denies life style limitation.  Previous noninvasive studies including ABI's of the legs do not identify critical vascular problems.  The patient should continue walking and begin a more formal exercise program. The patient should continue his antiplatelet therapy and aggressive treatment of the lipid abnormalities.  The patient should begin wearing graduated compression  socks 15-20 mmHg strength to control her mild edema.  Patient will follow-up with me on a PRN basis   3. Essential hypertension with goal blood pressure less than 130/80 Continue antihypertensive medications as already ordered, these medications have been reviewed and there are no changes at this time.   4. Type 2 diabetes mellitus with stage 3a chronic kidney disease, without long-term current use of insulin (HCC) Continue hypoglycemic medications as already ordered, these medications have been reviewed and there are no changes at this time.  Hgb A1C to be monitored as already arranged by primary service     Hortencia Pilar, MD  09/21/2019 4:27 PM

## 2019-09-27 ENCOUNTER — Inpatient Hospital Stay: Admission: RE | Admit: 2019-09-27 | Payer: Medicare Other | Source: Ambulatory Visit

## 2019-09-30 ENCOUNTER — Other Ambulatory Visit: Payer: Medicare Other

## 2019-11-04 ENCOUNTER — Encounter
Admission: RE | Admit: 2019-11-04 | Discharge: 2019-11-04 | Disposition: A | Discharge status: Home / Self Care | Payer: Medicare Other | Source: Ambulatory Visit | Attending: Otolaryngology | Admitting: Otolaryngology

## 2019-11-04 ENCOUNTER — Other Ambulatory Visit: Payer: Self-pay

## 2019-11-04 DIAGNOSIS — Z01818 Encounter for other preprocedural examination: Secondary | ICD-10-CM | POA: Insufficient documentation

## 2019-11-04 DIAGNOSIS — Z20822 Contact with and (suspected) exposure to covid-19: Secondary | ICD-10-CM | POA: Diagnosis not present

## 2019-11-04 DIAGNOSIS — I10 Essential (primary) hypertension: Secondary | ICD-10-CM | POA: Insufficient documentation

## 2019-11-04 DIAGNOSIS — I491 Atrial premature depolarization: Secondary | ICD-10-CM | POA: Diagnosis not present

## 2019-11-04 LAB — SARS CORONAVIRUS 2 (TAT 6-24 HRS): SARS Coronavirus 2: NEGATIVE

## 2019-11-04 LAB — CBC
HCT: 41.6 % (ref 39.0–52.0)
Hemoglobin: 14.1 g/dL (ref 13.0–17.0)
MCH: 30.8 pg (ref 26.0–34.0)
MCHC: 33.9 g/dL (ref 30.0–36.0)
MCV: 90.8 fL (ref 80.0–100.0)
Platelets: 212 10*3/uL (ref 150–400)
RBC: 4.58 MIL/uL (ref 4.22–5.81)
RDW: 12.9 % (ref 11.5–15.5)
WBC: 9 10*3/uL (ref 4.0–10.5)
nRBC: 0 % (ref 0.0–0.2)

## 2019-11-04 LAB — COMPREHENSIVE METABOLIC PANEL
ALT: 29 U/L (ref 0–44)
AST: 25 U/L (ref 15–41)
Albumin: 4.4 g/dL (ref 3.5–5.0)
Alkaline Phosphatase: 45 U/L (ref 38–126)
Anion gap: 9 (ref 5–15)
BUN: 20 mg/dL (ref 8–23)
CO2: 30 mmol/L (ref 22–32)
Calcium: 9.8 mg/dL (ref 8.9–10.3)
Chloride: 102 mmol/L (ref 98–111)
Creatinine, Ser: 1.39 mg/dL — ABNORMAL HIGH (ref 0.61–1.24)
GFR calc Af Amer: 58 mL/min — ABNORMAL LOW (ref >60)
GFR calc non Af Amer: 50 mL/min — ABNORMAL LOW (ref >60)
Glucose, Bld: 81 mg/dL (ref 70–99)
Potassium: 4 mmol/L (ref 3.5–5.1)
Sodium: 141 mmol/L (ref 135–145)
Total Bilirubin: 1 mg/dL (ref 0.3–1.2)
Total Protein: 7.2 g/dL (ref 6.5–8.1)

## 2019-11-04 NOTE — Patient Instructions (Signed)
COVID TESTING Date: November 04, 2019  Testing site:  [facility] [address] Thru Hours:  4:09 am - 1:00 pm Once you are tested, you are asked to stay quarantined (avoiding public places) until after your surgery.   Your procedure is scheduled on: November 06, 2019  Report to Day Surgery on the 2nd floor of the [facility]. To find out your arrival time, please call [phone] between 1PM - 3PM on: November 05, 2019   REMEMBER: Instructions that are not followed completely may result in serious medical risk, up to and including death; or upon the discretion of your surgeon and anesthesiologist your surgery may need to be rescheduled.  Do not eat food after midnight the night before surgery.  No gum chewing, lozengers or hard candies.  You may however, drink CLEAR liquids up to 2 hours before you are scheduled to arrive for your surgery. Do not drink anything within 2 hours of your scheduled arrival time.  Clear liquids include: - water   Do NOT drink anything that is not on this list.  Type 1 and Type 2 diabetics should only drink water.  TAKE THESE MEDICATIONS THE MORNING OF SURGERY WITH A SIP OF WATER: ZYRTEC PANTOPRAZOLE (take one the night before and one on the morning of surgery - helps to prevent nausea after surgery.)  Use inhalers  AND  FLONASE on the day of surgery.  Stop Metformin 2 days prior to surgery.  Follow recommendations from Cardiologist, Pulmonologist or PCP regarding stopping Aspirin, Coumadin, Plavix, Eliquis, Pradaxa, or Pletal.  Stop Anti-inflammatories (NSAIDS) such as Advil, Aleve, Ibuprofen, Motrin, Naproxen, Naprosyn and Aspirin based products such as Excedrin, Goodys Powder, BC Powder. (May take Tylenol or Acetaminophen if needed.)  Stop ANY OVER THE COUNTER supplements until after surgery. (May continue Vitamin D, Vitamin B, and multivitamin.)  No Alcohol for 24 hours before or after surgery.  No Smoking including  e-cigarettes for 24 hours prior to surgery.  No chewable tobacco products for at least 6 hours prior to surgery.  No nicotine patches on the day of surgery.  Do not use any "recreational" drugs for at least a week prior to your surgery.  Please be advised that the combination of cocaine and anesthesia may have negative outcomes, up to and including death. If you test positive for cocaine, your surgery will be cancelled.  On the morning of surgery brush your teeth with toothpaste and water, you may rinse your mouth with mouthwash if you wish. Do not swallow any toothpaste or mouthwash.  Do not wear jewelry, make-up, hairpins, clips or nail polish.  Do not wear lotions, powders, or perfumes.   Do not shave 48 hours prior to surgery.   Contact lenses, hearing aids and dentures may not be worn into surgery.  Do not bring valuables to the hospital. [facility] is not responsible for any missing/lost belongings or valuables.   SHOWER BEFORE SURGERY  Bring your C-PAP to the hospital with you in case you may have to spend the night.   Notify your doctor if there is any change in your medical condition (cold, fever, infection).  Wear comfortable clothing (specific to your surgery type) to the hospital.  Plan for stool softeners for home use; pain medications have a tendency to cause constipation. You can also help prevent constipation by eating foods high in fiber such as fruits and vegetables and drinking plenty of fluids as your diet allows.  After surgery, you  can help prevent lung complications by doing breathing exercises.  Take deep breaths and cough every 1-2 hours. Your doctor may order a device called an Incentive Spirometer to help you take deep breaths. When coughing or sneezing, hold a pillow firmly against your incision with both hands. This is called "splinting." Doing this helps protect your incision. It also decreases belly discomfort.  If you are being admitted to the  hospital overnight, leave your suitcase in the car. After surgery it may be brought to your room.  If you are being discharged the day of surgery, you will not be allowed to drive home. You will need a responsible adult (18 years or older) to drive you home and stay with you that night.   If you are taking public transportation, you will need to have a responsible adult (18 years or older) with you. Please confirm with your physician that it is acceptable to use public transportation.   Please call the [facility] at [phone] if you have any questions about these instructions.  Visitation Policy:  Patients undergoing a surgery or procedure may have one family member or support person with them as long as that person is not COVID-19 positive or experiencing its symptoms.  That person may remain in the waiting area during the procedure.  Children under 42 years of age may have both parents or legal guardians with them during their procedure.  Inpatient Visitation Update:   Two designated support people may visit a patient during visiting hours 7 am to 8 pm. It must be the same two designated people for the duration of the patient stay. The visitors may come and go during the day, and there is no switching out to have different visitors. A mask must be worn at all times, including in the patient room.  Children under 74 years of age:  a total of 4 designated visitors for the child's entire stay are allowed. Only 2 in the room at a time and only one staying overnight at a time. The overnight guest can now rotate during the child's hospital stay.  As a reminder, masks are still required for all [facility] team members, patients and visitors in all [facility] facilities.   Systemwide, no visitors 17 or younger.

## 2019-11-05 ENCOUNTER — Other Ambulatory Visit: Payer: Medicare Other

## 2019-11-05 ENCOUNTER — Encounter: Payer: Self-pay | Admitting: Otolaryngology

## 2019-11-05 MED ORDER — CEFAZOLIN SODIUM-DEXTROSE 2-4 GM/100ML-% IV SOLN
2.0000 g | Freq: Once | INTRAVENOUS | Status: AC
  Administered 2019-11-06: 2 g via INTRAVENOUS

## 2019-11-06 ENCOUNTER — Ambulatory Visit
Admission: RE | Admit: 2019-11-06 | Discharge: 2019-11-06 | Disposition: A | Discharge status: Home / Self Care | Payer: Medicare Other | Attending: Otolaryngology | Admitting: Otolaryngology

## 2019-11-06 ENCOUNTER — Other Ambulatory Visit: Payer: Self-pay

## 2019-11-06 ENCOUNTER — Encounter: Payer: Self-pay | Admitting: Otolaryngology

## 2019-11-06 ENCOUNTER — Ambulatory Visit: Payer: Medicare Other | Admitting: Certified Registered Nurse Anesthetist

## 2019-11-06 ENCOUNTER — Encounter
Admission: RE | Disposition: A | Discharge status: Home / Self Care | Payer: Self-pay | Source: Home / Self Care | Attending: Otolaryngology

## 2019-11-06 DIAGNOSIS — J449 Chronic obstructive pulmonary disease, unspecified: Secondary | ICD-10-CM | POA: Diagnosis not present

## 2019-11-06 DIAGNOSIS — I2721 Secondary pulmonary arterial hypertension: Secondary | ICD-10-CM | POA: Diagnosis not present

## 2019-11-06 DIAGNOSIS — E1140 Type 2 diabetes mellitus with diabetic neuropathy, unspecified: Secondary | ICD-10-CM | POA: Insufficient documentation

## 2019-11-06 DIAGNOSIS — E669 Obesity, unspecified: Secondary | ICD-10-CM | POA: Diagnosis not present

## 2019-11-06 DIAGNOSIS — J343 Hypertrophy of nasal turbinates: Secondary | ICD-10-CM | POA: Diagnosis not present

## 2019-11-06 DIAGNOSIS — N189 Chronic kidney disease, unspecified: Secondary | ICD-10-CM | POA: Insufficient documentation

## 2019-11-06 DIAGNOSIS — Z85828 Personal history of other malignant neoplasm of skin: Secondary | ICD-10-CM | POA: Diagnosis not present

## 2019-11-06 DIAGNOSIS — E785 Hyperlipidemia, unspecified: Secondary | ICD-10-CM | POA: Insufficient documentation

## 2019-11-06 DIAGNOSIS — Z886 Allergy status to analgesic agent status: Secondary | ICD-10-CM | POA: Insufficient documentation

## 2019-11-06 DIAGNOSIS — Z881 Allergy status to other antibiotic agents status: Secondary | ICD-10-CM | POA: Insufficient documentation

## 2019-11-06 DIAGNOSIS — Z79899 Other long term (current) drug therapy: Secondary | ICD-10-CM | POA: Insufficient documentation

## 2019-11-06 DIAGNOSIS — G4733 Obstructive sleep apnea (adult) (pediatric): Secondary | ICD-10-CM | POA: Insufficient documentation

## 2019-11-06 DIAGNOSIS — J342 Deviated nasal septum: Secondary | ICD-10-CM | POA: Insufficient documentation

## 2019-11-06 DIAGNOSIS — E1122 Type 2 diabetes mellitus with diabetic chronic kidney disease: Secondary | ICD-10-CM | POA: Insufficient documentation

## 2019-11-06 DIAGNOSIS — J3489 Other specified disorders of nose and nasal sinuses: Secondary | ICD-10-CM | POA: Insufficient documentation

## 2019-11-06 DIAGNOSIS — I129 Hypertensive chronic kidney disease with stage 1 through stage 4 chronic kidney disease, or unspecified chronic kidney disease: Secondary | ICD-10-CM | POA: Insufficient documentation

## 2019-11-06 HISTORY — PX: NASAL SEPTOPLASTY W/ TURBINOPLASTY: SHX2070

## 2019-11-06 LAB — GLUCOSE, CAPILLARY
Glucose-Capillary: 126 mg/dL — ABNORMAL HIGH (ref 70–99)
Glucose-Capillary: 152 mg/dL — ABNORMAL HIGH (ref 70–99)

## 2019-11-06 SURGERY — SEPTOPLASTY, NOSE, WITH NASAL TURBINATE REDUCTION
Anesthesia: General

## 2019-11-06 MED ORDER — PROPOFOL 10 MG/ML IV BOLUS
INTRAVENOUS | Status: AC
  Filled 2019-11-06: qty 20

## 2019-11-06 MED ORDER — LIDOCAINE HCL (CARDIAC) PF 100 MG/5ML IV SOSY
PREFILLED_SYRINGE | INTRAVENOUS | Status: DC | PRN
  Administered 2019-11-06: 100 mg via INTRAVENOUS

## 2019-11-06 MED ORDER — FENTANYL CITRATE (PF) 100 MCG/2ML IJ SOLN
INTRAMUSCULAR | Status: DC | PRN
  Administered 2019-11-06: 25 ug via INTRAVENOUS
  Administered 2019-11-06: 50 ug via INTRAVENOUS
  Administered 2019-11-06: 25 ug via INTRAVENOUS

## 2019-11-06 MED ORDER — OXYMETAZOLINE HCL 0.05 % NA SOLN: 2.0000 | Freq: Once | NASAL | Status: AC

## 2019-11-06 MED ORDER — LIDOCAINE-EPINEPHRINE 1 %-1:100000 IJ SOLN
INTRAMUSCULAR | Status: DC | PRN
  Administered 2019-11-06: 5 mL

## 2019-11-06 MED ORDER — PROPOFOL 10 MG/ML IV BOLUS
INTRAVENOUS | Status: DC | PRN
  Administered 2019-11-06: 130 mg via INTRAVENOUS

## 2019-11-06 MED ORDER — ONDANSETRON HCL 4 MG/2ML IJ SOLN: 4.0000 mg | Freq: Once | INTRAMUSCULAR | Status: DC | PRN

## 2019-11-06 MED ORDER — SODIUM CHLORIDE 0.9 % IV SOLN: INTRAVENOUS | Status: DC

## 2019-11-06 MED ORDER — FENTANYL CITRATE (PF) 100 MCG/2ML IJ SOLN
INTRAMUSCULAR | Status: AC
  Filled 2019-11-06: qty 2

## 2019-11-06 MED ORDER — FENTANYL CITRATE (PF) 100 MCG/2ML IJ SOLN: 25.0000 ug | INTRAMUSCULAR | Status: DC | PRN

## 2019-11-06 MED ORDER — EPHEDRINE SULFATE 50 MG/ML IJ SOLN
INTRAMUSCULAR | Status: DC | PRN
  Administered 2019-11-06: 10 mg via INTRAVENOUS
  Administered 2019-11-06: 5 mg via INTRAVENOUS

## 2019-11-06 MED ORDER — REMIFENTANIL HCL 1 MG IV SOLR
INTRAVENOUS | Status: AC
  Filled 2019-11-06: qty 1000

## 2019-11-06 MED ORDER — ORAL CARE MOUTH RINSE: 15.0000 mL | Freq: Once | OROMUCOSAL | Status: AC

## 2019-11-06 MED ORDER — CHLORHEXIDINE GLUCONATE 0.12 % MT SOLN
OROMUCOSAL | Status: AC
  Administered 2019-11-06: 15 mL via OROMUCOSAL
  Filled 2019-11-06: qty 15

## 2019-11-06 MED ORDER — ROCURONIUM BROMIDE 10 MG/ML (PF) SYRINGE
PREFILLED_SYRINGE | INTRAVENOUS | Status: AC
  Filled 2019-11-06: qty 10

## 2019-11-06 MED ORDER — HYDROCODONE-ACETAMINOPHEN 5-325 MG PO TABS: 1.0000 | ORAL_TABLET | Freq: Four times a day (QID) | ORAL | 0 refills | Status: AC | PRN

## 2019-11-06 MED ORDER — PREDNISONE 10 MG PO TABS
ORAL_TABLET | ORAL | 0 refills | Status: DC
Start: 2019-11-06 — End: 2021-10-28

## 2019-11-06 MED ORDER — SODIUM CHLORIDE 0.9 % IV SOLN
INTRAVENOUS | Status: DC | PRN
  Administered 2019-11-06: 30 ug/min via INTRAVENOUS

## 2019-11-06 MED ORDER — ONDANSETRON HCL 4 MG/2ML IJ SOLN: INTRAMUSCULAR | Status: DC | PRN

## 2019-11-06 MED ORDER — CEFAZOLIN SODIUM-DEXTROSE 2-4 GM/100ML-% IV SOLN
INTRAVENOUS | Status: AC
  Filled 2019-11-06: qty 100

## 2019-11-06 MED ORDER — DEXAMETHASONE SODIUM PHOSPHATE 10 MG/ML IJ SOLN
INTRAMUSCULAR | Status: DC | PRN
  Administered 2019-11-06: 10 mg via INTRAVENOUS

## 2019-11-06 MED ORDER — LIDOCAINE-EPINEPHRINE 1 %-1:100000 IJ SOLN
INTRAMUSCULAR | Status: AC
  Filled 2019-11-06: qty 1

## 2019-11-06 MED ORDER — OXYMETAZOLINE HCL 0.05 % NA SOLN
NASAL | Status: DC | PRN
  Administered 2019-11-06: 1 via TOPICAL

## 2019-11-06 MED ORDER — LIDOCAINE HCL (PF) 2 % IJ SOLN
INTRAMUSCULAR | Status: AC
  Filled 2019-11-06: qty 5

## 2019-11-06 MED ORDER — SUGAMMADEX SODIUM 200 MG/2ML IV SOLN
INTRAVENOUS | Status: DC | PRN
  Administered 2019-11-06: 200 mg via INTRAVENOUS

## 2019-11-06 MED ORDER — OXYMETAZOLINE HCL 0.05 % NA SOLN
NASAL | Status: AC
  Administered 2019-11-06: 2 via NASAL
  Filled 2019-11-06: qty 30

## 2019-11-06 MED ORDER — MIDAZOLAM HCL 2 MG/2ML IJ SOLN
INTRAMUSCULAR | Status: AC
  Filled 2019-11-06: qty 2

## 2019-11-06 MED ORDER — BACITRACIN ZINC 500 UNIT/GM EX OINT
TOPICAL_OINTMENT | CUTANEOUS | Status: AC
  Filled 2019-11-06: qty 28.35

## 2019-11-06 MED ORDER — CHLORHEXIDINE GLUCONATE 0.12 % MT SOLN: 15.0000 mL | Freq: Once | OROMUCOSAL | Status: AC

## 2019-11-06 MED ORDER — GLYCOPYRROLATE 0.2 MG/ML IJ SOLN
INTRAMUSCULAR | Status: AC
  Filled 2019-11-06: qty 1

## 2019-11-06 MED ORDER — BACITRACIN ZINC 500 UNIT/GM EX OINT
TOPICAL_OINTMENT | CUTANEOUS | Status: DC | PRN
  Administered 2019-11-06: 1 via TOPICAL

## 2019-11-06 MED ORDER — ROCURONIUM BROMIDE 100 MG/10ML IV SOLN
INTRAVENOUS | Status: DC | PRN
  Administered 2019-11-06: 30 mg via INTRAVENOUS
  Administered 2019-11-06: 20 mg via INTRAVENOUS
  Administered 2019-11-06: 10 mg via INTRAVENOUS

## 2019-11-06 MED ORDER — PHENYLEPHRINE HCL (PRESSORS) 10 MG/ML IV SOLN
INTRAVENOUS | Status: DC | PRN
  Administered 2019-11-06 (×3): 100 ug via INTRAVENOUS

## 2019-11-06 MED ORDER — DEXAMETHASONE SODIUM PHOSPHATE 10 MG/ML IJ SOLN
INTRAMUSCULAR | Status: AC
  Filled 2019-11-06: qty 1

## 2019-11-06 MED ORDER — ONDANSETRON HCL 4 MG/2ML IJ SOLN
INTRAMUSCULAR | Status: AC
  Filled 2019-11-06: qty 2

## 2019-11-06 MED ORDER — ONDANSETRON HCL 4 MG/2ML IJ SOLN
INTRAMUSCULAR | Status: DC | PRN
  Administered 2019-11-06: 4 mg via INTRAVENOUS

## 2019-11-06 MED ORDER — GLYCOPYRROLATE 0.2 MG/ML IJ SOLN
INTRAMUSCULAR | Status: DC | PRN
  Administered 2019-11-06: .2 mg via INTRAVENOUS

## 2019-11-06 MED ORDER — REMIFENTANIL HCL 1 MG IV SOLR
INTRAVENOUS | Status: DC | PRN
  Administered 2019-11-06: .1 ug/kg/min via INTRAVENOUS

## 2019-11-06 MED ORDER — LIDOCAINE-EPINEPHRINE (PF) 1 %-1:200000 IJ SOLN
INTRAMUSCULAR | Status: AC
  Filled 2019-11-06: qty 30

## 2019-11-06 MED ORDER — CEPHALEXIN 500 MG PO CAPS: 500.0000 mg | ORAL_CAPSULE | Freq: Two times a day (BID) | ORAL | 0 refills | Status: DC

## 2019-11-06 SURGICAL SUPPLY — 37 items
BLADE SURG 15 STRL LF DISP TIS (BLADE) ×2 IMPLANT
BLADE SURG 15 STRL SS (BLADE) ×2
CANISTER SUCT 1200ML W/VALVE (MISCELLANEOUS) ×4 IMPLANT
COAG SUCT 10F 3.5MM HAND CTRL (MISCELLANEOUS) ×4 IMPLANT
COVER WAND RF STERILE (DRAPES) ×4 IMPLANT
ELECT REM PT RETURN 9FT ADLT (ELECTROSURGICAL) ×4
ELECTRODE REM PT RTRN 9FT ADLT (ELECTROSURGICAL) ×2 IMPLANT
GEL ULTRASOUND 20GR AQUASONIC (MISCELLANEOUS) ×4 IMPLANT
GLOVE PI ULTRA LF STRL 7.5 (GLOVE) ×4 IMPLANT
GLOVE PI ULTRA NON LATEX 7.5 (GLOVE) ×4
GLOVE PROTEXIS LATEX SZ 7.5 (GLOVE) ×8 IMPLANT
GOWN STRL REUS W/ TWL LRG LVL3 (GOWN DISPOSABLE) ×4 IMPLANT
GOWN STRL REUS W/TWL LRG LVL3 (GOWN DISPOSABLE) ×4
KIT TURNOVER KIT A (KITS) ×4 IMPLANT
LABEL OR SOLS (LABEL) ×4 IMPLANT
NEEDLE ANESTHESIA  27G X 3.5 (NEEDLE)
NEEDLE ANESTHESIA 27G X 3.5 (NEEDLE) IMPLANT
NEEDLE HYPO 25GX1X1/2 BEV (NEEDLE) ×4 IMPLANT
NEEDLE HYPO 27GX1-1/4 (NEEDLE) ×4 IMPLANT
NS IRRIG 500ML POUR BTL (IV SOLUTION) ×4 IMPLANT
PACK ENT CUSTOM (PACKS) ×4 IMPLANT
PACK HEAD/NECK (MISCELLANEOUS) ×4 IMPLANT
PATTIES SURGICAL .5 X3 (DISPOSABLE) ×4 IMPLANT
SOL ANTI-FOG 6CC FOG-OUT (MISCELLANEOUS) ×2 IMPLANT
SOL FOG-OUT ANTI-FOG 6CC (MISCELLANEOUS) ×2
SPLINT NASAL REUTER .5MM BIVLV (MISCELLANEOUS) ×4 IMPLANT
STRAP BODY AND KNEE 60X3 (MISCELLANEOUS) ×4 IMPLANT
STYLUS VIVAER (MISCELLANEOUS) ×2
STYLUS VIVAER BP ELECT (MISCELLANEOUS) ×2 IMPLANT
SUT CHROMIC 3-0 (SUTURE) ×2
SUT CHROMIC 3-0 KS 27XMFL CR (SUTURE) ×2
SUT ETHILON 3-0 KS 30 BLK (SUTURE) ×4 IMPLANT
SUT PLAIN GUT 4-0 (SUTURE) ×4 IMPLANT
SUTURE CHRMC 3-0 KS 27XMFL CR (SUTURE) ×2 IMPLANT
SYR 3ML LL SCALE MARK (SYRINGE) ×4 IMPLANT
TOWEL OR 17X26 4PK STRL BLUE (TOWEL DISPOSABLE) ×4 IMPLANT
WATER STERILE IRR 250ML POUR (IV SOLUTION) ×4 IMPLANT

## 2019-11-06 NOTE — Anesthesia Preprocedure Evaluation (Signed)
Anesthesia Evaluation  Patient identified by MRN, date of birth, ID band Patient awake    Reviewed: Allergy & Precautions, NPO status , Patient's Chart, lab work & pertinent test results  History of Anesthesia Complications Negative for: history of anesthetic complications  Airway Mallampati: III  TM Distance: >3 FB Neck ROM: Full    Dental  (+) Poor Dentition   Pulmonary sleep apnea , COPD,  COPD inhaler,    breath sounds clear to auscultation- rhonchi (-) wheezing      Cardiovascular hypertension, Pt. on medications (-) CAD, (-) Past MI, (-) Cardiac Stents and (-) CABG  Rhythm:Regular Rate:Normal - Systolic murmurs and - Diastolic murmurs    Neuro/Psych neg Seizures negative neurological ROS  negative psych ROS   GI/Hepatic negative GI ROS, Neg liver ROS,   Endo/Other  diabetes, Oral Hypoglycemic Agents  Renal/GU Renal InsufficiencyRenal disease     Musculoskeletal negative musculoskeletal ROS (+)   Abdominal (+) + obese,   Peds  Hematology negative hematology ROS (+)   Anesthesia Other Findings Past Medical History: No date: Anginal pain (HCC) No date: Brown recluse spider bite No date: Cancer ([place])     Comment:  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, LEFT ARM  No date: COPD (chronic obstructive pulmonary disease) (HCC) No date: Diabetes mellitus without complication (HCC) 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) No date: Elevated lipids No date: Hypertension No date: Obesity No date: Pulmonary arterial hypertension (HCC) No date: Sleep apnea   Reproductive/Obstetrics                             Anesthesia Physical Anesthesia Plan  ASA: III  Anesthesia Plan: General   Post-op Pain Management:    Induction: Intravenous  PONV Risk Score and Plan: 1 and Ondansetron and Dexamethasone  Airway Management Planned: Oral ETT  Additional Equipment:   Intra-op Plan:   Post-operative Plan: Extubation  in OR  Informed Consent: I have reviewed the patients History and Physical, chart, labs and discussed the procedure including the risks, benefits and alternatives for the proposed anesthesia with the patient or authorized representative who has indicated his/her understanding and acceptance.     Dental advisory given  Plan Discussed with: CRNA and Anesthesiologist  Anesthesia Plan Comments:         Anesthesia Quick Evaluation

## 2019-11-06 NOTE — Op Note (Signed)
11/06/2019  8:41 AM   [id number]   Pre-Op Dx:  Deviated Nasal Septum, Hypertrophic Inferior Turbinates, narrow nasal valves  Post-op Dx: Same  Proc: Nasal Septoplasty, Bilateral Partial Reduction Inferior Turbinates, destruction of intranasal lesions of the nasal valves on both sides  Surg:  Bobby Chen  Anes:  GOT  EBL: 50 mL  Comp: None  Findings: Deviated septum inferiorly at the left side with a posterior septal spur.  He also had his ethmoid plate buckled to the left as well.  He had overgrown right inferior turbinate and some on the left inferior turbinate.  Procedure: With the patient in a comfortable supine position,  general orotracheal anesthesia was induced without difficulty.     The patient received preoperative Afrin spray for topical decongestion and vasoconstriction.  Intravenous prophylactic antibiotics were administered.  At an appropriate level, the patient was placed in a semi-sitting position.  Nasal vibrissae were trimmed.   1% Xylocaine with 1:100,000 epinephrine, 5 cc's, was infiltrated into the anterior floor of the nose, into the nasal spine region, into the membranous columella, and finally into the submucoperichondrial plane of the septum on both sides.  Several minutes were allowed for this to take effect.  Cottoniod pledgetts soaked in Afrin and 4% Xylocaine were placed into both nasal cavities and left while the patient was prepped and draped in the standard fashion.  The materials were removed from the nose and observed to be intact and correct in number.  The nose was inspected with a headlight and zero degree scope with the findings as described above.  A left Killian incision was sharply executed and carried down to the quadrangular cartilage. The mucoperichondrium was elelvated along the quadrangular plate back to the bony-cartilaginous junction. The mucoperiostium was then elevated along the ethmoid plate and the vomer. The boney-catilaginous  junction was then split with a freer elevator and the mucoperiosteum was elevated on the opposite side. The mucoperiosteum was then elevated along the maxillary crest as needed to expose the crooked bone of the crest.  Boney spurs of the vomer and maxillary crest were removed with Donavan Foil forceps.  The cartilaginous plate was trimmed along its posterior and inferior borders of about 2 mm of cartilage to free it up inferiorly. Some of the deviated ethmoid plate was then fractured and removed with Takahashi forceps to free up the posterior border of the quadrangular plate and allow it to swing back to the midline. The mucosal flaps were placed back into their anatomic position to allow visualization of the airways. The septum now sat in the midline with an improved airway.  A 3-0 Chromic suture on a Keith needle in used to anchor the inferior septum at the nasal spine with a through and through suture. The mucosal flaps are then sutured together using a through and through whip stitch of 4-0 Plain Gut with a mini-Keith needle. This was used to close the [place] incision as well.   The inferior turbinates were then inspected. An incision was created along the inferior aspect of the left inferior turbinate with removal of some of the inferior soft tissue and bone. Electrocautery was used to control bleeding in the area. The remaining turbinate was then outfractured to open up the airway further. There was no significant bleeding noted. The right turbinate was then trimmed and outfractured in a similar fashion.  The Vivaer machine and handpiece was set up.  The nasal valves were visualized and these were collapsed inward on both sides.  1 mL of local was placed into each side of the nasal valves.  Using the handpiece with the prearranged settings, some gel was placed on the handpiece and it was placed into the nasal valve on the left side.  The power was applied for 30 seconds cycles with 18 seconds of power  followed by 12 seconds of cooling.  This was done in 3 separate places on the nasal valve on the left side for widening and remodeling of the nasal valve.  This was then repeated on the right side again using 3 separate cycles of energy to remodel the nasal valve on the right side.  This was then used on the inferior turbinates on both sides for helping to remodel the soft tissue of the inferior turbinates bilaterally.  The airways were then visualized and showed open passageways on both sides that were significantly improved compared to before surgery.  The nasal valves were more open bilaterally.  There was no signifcant bleeding. Nasal splints were applied to both sides of the septum using Xomed 0.78mm regular sized splints that were trimmed, and then held in position with a 3-0 Nylon through and through suture.  The patient was turned back over to anesthesia, and awakened, extubated, and taken to the PACU in satisfactory condition.  Dispo:   PACU to home  Plan: Ice, elevation, narcotic analgesia, steroid taper, and prophylactic antibiotics for the duration of indwelling nasal foreign bodies.  We will reevaluate the patient in the office in 6 days and remove the septal splints.  Return to work in 10 days, strenuous activities in two weeks.   Bobby Chen 11/06/2019 8:41 AM

## 2019-11-06 NOTE — Transfer of Care (Signed)
Immediate Anesthesia Transfer of Care Note  Patient: Bobby Chen  Procedure(s) Performed: NASAL SEPTOPLASTY WITH INFERIOR TURBINATE REDUCTION (Bilateral ) NASAL VALVE REPAIR WITH VIVAER (N/A )  Patient Location: PACU  Anesthesia Type:General  Level of Consciousness: awake, alert  and patient cooperative  Airway & Oxygen Therapy: Patient Spontanous Breathing and Patient connected to face mask oxygen  Post-op Assessment: Report given to RN and Post -op Vital signs reviewed and stable  Post vital signs: Reviewed and stable  Last Vitals:  Vitals Value Taken Time  BP 126/61 11/06/19 0854  Temp    Pulse 87 11/06/19 0855  Resp 16 11/06/19 0855  SpO2 96 % 11/06/19 0855  Vitals shown include unvalidated device data.  Last Pain:  Vitals:   11/06/19 0638  TempSrc: Temporal  PainSc: 0-No pain         Complications: No complications documented.

## 2019-11-06 NOTE — Discharge Instructions (Signed)
Ice, elevation to the nasal head area, steroid taper, and antibiotics      Septoplasty, Care After This sheet gives you information about how to care for yourself after your procedure. Your health care provider may also give you more specific instructions. If you have problems or questions, contact your health care provider. What can I expect after the procedure? After the procedure, it is common to have:  A mild headache.  A stuffy nose.  A feeling of fullness in your ears.  Bloody fluid coming from your nose. Follow these instructions at home: Medicines   Take over-the-counter and prescription medicines only as told by your health care provider.  If you were prescribed an antibiotic medicine, use it as told by your health care provider. Do not stop using the antibiotic even if you start to feel better.  If you are taking prescription pain medicine, take actions to prevent or treat constipation. Your health care provider may recommend that you: ? Drink enough fluid to keep your urine pale yellow. ? Eat foods that are high in fiber, such as fresh fruits and vegetables, whole grains, and beans. ? Limit foods that are high in fat and processed sugars, such as fried or sweet foods. ? Take an over-the-counter or prescription medicine for constipation. What to avoid   Avoid eating hot and spicy foods for several days after surgery or as told by your health care provider.  Do not blow your nose for 2 weeks after surgery or as told by your health care provider.  Avoid strenuous activities for 2 weeks. These include activities such as running or playing sports. These activities can cause nosebleeds.  Avoid straining when having a bowel movement. Straining can cause a nosebleed.  Avoid very hot or steamy showers for several days after surgery or as told by your health care provider.  Do not lift anything that is heavier than 10 lb (4.5 kg), or the limit that you are told, until your  health care provider says that it is safe. General instructions   You may be asked to clean your nostrils with an over-the-counter saline nasal spray. This will help to clear the crusts and blood clots in your nose. Use it as told by your health care provider.  Raise (elevate) your head while you are lying down.  If you have nasal splints, follow your health care provider's instructions about removal.  If your nose was packed with gauze, follow your health care provider's instructions about removal.  Keep all follow-up visits as told by your health care provider. This is important. Contact a health care provider if you:  Develop swelling or increased pain in your nose.  Have yellowish-white fluid (pus) coming from your nose.  Have a fever.  Have severe diarrhea.  Have nausea that does not go away.  Cannot breathe through your nose. Get help right away if you:  Are short of breath.  Feel dizzy or you faint.  Have vision changes.  Are bleeding heavily from your nose.  Are vomiting.  Have a severe headache or a stiff neck. Summary  After the procedure, it is common to have a mild headache, stuffy nose, feeling of fullness in your ears, and bloody fluid coming from your nose.  Follow instructions from your health care provider about food and fluids that you should avoid.  Do not blow your nose for 2 weeks after the surgery.  Do not lift anything that is heavier than 10 lb (4.5 kg),  or the limit that you are told, until your health care provider says that it is safe. This information is not intended to replace advice given to you by your health care provider. Make sure you discuss any questions you have with your health care provider. Document Revised: 01/26/2018 Document Reviewed: 05/19/2017 Elsevier Patient Education  2020 [place]   1) The drugs that you were given will stay in your system until tomorrow so  for the next 24 hours you should not:  A) Drive an automobile B) Make any legal decisions C) Drink any alcoholic beverage   2) You may resume regular meals tomorrow.  Today it is better to start with liquids and gradually work up to solid foods.  You may eat anything you prefer, but it is better to start with liquids, then soup and crackers, and gradually work up to solid foods.   3) Please notify your doctor immediately if you have any unusual bleeding, trouble breathing, redness and pain at the surgery site, drainage, fever, or pain not relieved by medication.    4) Additional Instructions:        Please contact your physician with any problems or Same Day Surgery at [phone], Monday through Friday 6 am to 4 pm, or [place] [facility] number at [phone].

## 2019-11-06 NOTE — Anesthesia Procedure Notes (Signed)
Procedure Name: Intubation Date/Time: 11/06/2019 7:33 AM Performed by: Lily Peer, Summer, RN Pre-anesthesia Checklist: Patient identified, Patient being monitored, Emergency Drugs available and Suction available Patient Re-evaluated:Patient Re-evaluated prior to induction Oxygen Delivery Method: Circle system utilized Preoxygenation: Pre-oxygenation with 100% oxygen Induction Type: IV induction Ventilation: Mask ventilation without difficulty and Oral airway inserted - appropriate to patient size Laryngoscope Size: McGraph and 4 Grade View: Grade I Tube type: Oral Rae Tube size: 7.5 mm Number of attempts: 1 Placement Confirmation: ETT inserted through vocal cords under direct vision,  positive ETCO2 and breath sounds checked- equal and bilateral Secured at: 22 cm Tube secured with: Tape Dental Injury: Teeth and Oropharynx as per pre-operative assessment

## 2019-11-06 NOTE — H&P (Signed)
H&P has been reviewed and patient reevaluated, no changes necessary. To be downloaded later.  

## 2019-11-06 NOTE — Anesthesia Postprocedure Evaluation (Signed)
Anesthesia Post Note  Patient: Bobby Chen  Procedure(s) Performed: NASAL SEPTOPLASTY WITH INFERIOR TURBINATE REDUCTION (Bilateral ) NASAL VALVE REPAIR WITH VIVAER (N/A )  Patient location during evaluation: PACU Anesthesia Type: General Level of consciousness: awake and alert and oriented Pain management: pain level controlled Vital Signs Assessment: post-procedure vital signs reviewed and stable Respiratory status: spontaneous breathing, nonlabored ventilation and respiratory function stable Cardiovascular status: blood pressure returned to baseline and stable Postop Assessment: no signs of nausea or vomiting Anesthetic complications: no   No complications documented.   Last Vitals:  Vitals:   11/06/19 0940 11/06/19 0948  BP: (!) 112/57 125/60  Pulse: 89 86  Resp: 19 16  Temp: (!) 36.3 C (!) 36.2 C  SpO2: 93% 94%    Last Pain:  Vitals:   11/06/19 0948  TempSrc: Temporal  PainSc: 0-No pain                 Josip Merolla

## 2019-11-11 ENCOUNTER — Telehealth: Payer: Self-pay

## 2019-11-11 NOTE — Telephone Encounter (Signed)
PC to pt. To inform of water contamination reported in [place] last week.  Pt. Stated he was aware of this.  Advised pt. That he is at very low risk of developing infection from this, as there was no contamination found in the water at [facility].  Pt. Denied any ill feeling.  Advised to report any diarrhea, stomach cramps or nausea/ vomiting to his PCP.  Pt. Verb. Understanding, and voiced appreciation for the call.

## 2020-01-01 ENCOUNTER — Other Ambulatory Visit: Payer: Self-pay | Admitting: Physician Assistant

## 2020-01-01 DIAGNOSIS — IMO0001 Reserved for inherently not codable concepts without codable children: Secondary | ICD-10-CM

## 2020-01-20 ENCOUNTER — Other Ambulatory Visit: Payer: Self-pay

## 2020-01-20 ENCOUNTER — Ambulatory Visit
Admission: RE | Admit: 2020-01-20 | Discharge: 2020-01-20 | Disposition: A | Discharge status: Home / Self Care | Payer: Medicare Other | Source: Ambulatory Visit | Attending: Physician Assistant | Admitting: Physician Assistant

## 2020-01-20 DIAGNOSIS — H9042 Sensorineural hearing loss, unilateral, left ear, with unrestricted hearing on the contralateral side: Secondary | ICD-10-CM | POA: Diagnosis present

## 2020-01-20 DIAGNOSIS — IMO0001 Reserved for inherently not codable concepts without codable children: Secondary | ICD-10-CM

## 2020-01-20 MED ORDER — GADOBUTROL 1 MMOL/ML IV SOLN
10.0000 mL | Freq: Once | INTRAVENOUS | Status: AC | PRN
  Administered 2020-01-20: 10 mL via INTRAVENOUS

## 2020-02-03 DIAGNOSIS — E538 Deficiency of other specified B group vitamins: Secondary | ICD-10-CM | POA: Insufficient documentation

## 2020-02-17 ENCOUNTER — Other Ambulatory Visit: Payer: Self-pay | Admitting: Physician Assistant

## 2020-02-17 DIAGNOSIS — R220 Localized swelling, mass and lump, head: Secondary | ICD-10-CM

## 2020-02-17 DIAGNOSIS — R221 Localized swelling, mass and lump, neck: Secondary | ICD-10-CM

## 2020-02-19 ENCOUNTER — Other Ambulatory Visit: Payer: Self-pay

## 2020-02-19 ENCOUNTER — Ambulatory Visit
Admission: RE | Admit: 2020-02-19 | Discharge: 2020-02-19 | Disposition: A | Discharge status: Home / Self Care | Payer: Medicare Other | Source: Ambulatory Visit | Attending: Physician Assistant | Admitting: Physician Assistant

## 2020-02-19 DIAGNOSIS — R221 Localized swelling, mass and lump, neck: Secondary | ICD-10-CM

## 2020-02-19 DIAGNOSIS — R220 Localized swelling, mass and lump, head: Secondary | ICD-10-CM | POA: Insufficient documentation

## 2020-04-10 DIAGNOSIS — R262 Difficulty in walking, not elsewhere classified: Secondary | ICD-10-CM | POA: Insufficient documentation

## 2020-04-10 DIAGNOSIS — H9193 Unspecified hearing loss, bilateral: Secondary | ICD-10-CM | POA: Insufficient documentation

## 2020-04-10 DIAGNOSIS — R278 Other lack of coordination: Secondary | ICD-10-CM | POA: Insufficient documentation

## 2021-01-20 ENCOUNTER — Ambulatory Visit: Payer: Self-pay | Admitting: Internal Medicine

## 2021-03-13 ENCOUNTER — Other Ambulatory Visit: Payer: Self-pay

## 2021-03-13 ENCOUNTER — Ambulatory Visit
Admission: EM | Admit: 2021-03-13 | Discharge: 2021-03-13 | Disposition: A | Discharge status: Home / Self Care | Payer: Medicare Other | Attending: Emergency Medicine | Admitting: Emergency Medicine

## 2021-03-13 DIAGNOSIS — K047 Periapical abscess without sinus: Secondary | ICD-10-CM

## 2021-03-13 DIAGNOSIS — S025XXA Fracture of tooth (traumatic), initial encounter for closed fracture: Secondary | ICD-10-CM | POA: Diagnosis not present

## 2021-03-13 DIAGNOSIS — K029 Dental caries, unspecified: Secondary | ICD-10-CM

## 2021-03-13 MED ORDER — SULFAMETHOXAZOLE-TRIMETHOPRIM 800-160 MG PO TABS: 1.0000 | ORAL_TABLET | Freq: Two times a day (BID) | ORAL | 0 refills | Status: AC

## 2021-03-13 MED ORDER — TRAMADOL HCL 50 MG PO TABS: 50.0000 mg | ORAL_TABLET | Freq: Four times a day (QID) | ORAL | 0 refills | Status: DC | PRN

## 2021-03-13 NOTE — ED Triage Notes (Signed)
Pt here with C/O Upper left mouth pain for 4 days. States when he touches it it is a sharp pain.

## 2021-03-13 NOTE — ED Provider Notes (Signed)
[facility]    CSN: [id number] Arrival date & time: 03/13/21  1105      History   Chief Complaint Chief Complaint  Patient presents with   Dental Pain    HPI Bobby Chen is a 75 y.o. male.   Pt here for lt upper tooth pain since last night. States that it hurts more so with touching the area. Denies any fevers, has not had any previous pain in the area before. Pt does not have an appoint till [place].    Past Medical History:  Diagnosis Date   Anginal pain ([place])    Brown recluse spider bite    Cancer ([place])    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, LEFT ARM    COPD (chronic obstructive pulmonary disease) ([place])    Diabetes mellitus without complication (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[place])    Elevated lipids    Hypertension    Obesity    Pulmonary arterial hypertension ([place])    Sleep apnea     Patient Active Problem List   Diagnosis Date Noted   Leg pain, right 08/26/2019   Chronic venous insufficiency 08/26/2019   PAD (peripheral artery disease) ([place]) 08/14/2019   Obstructive sleep apnea 05/06/2019   Obesity, Class II, BMI 35-39.9 03/11/2019   Type 2 diabetes mellitus with stage 3a chronic kidney disease, without long-term current use of insulin ([place]) 03/11/2019   Pain in joint involving ankle and foot 11/12/2018   Nuclear sclerotic cataract of both eyes 08/30/2018   Abnormal sensation in left ear 08/20/2018   Dizziness 08/20/2018   Numbness and tingling of left side of face 08/20/2018   Essential hypertension with goal blood pressure less than 130/80 03/19/2018   Healthcare maintenance 08/07/2017   Brown recluse spider bite 05/08/2017   SCC (squamous cell carcinoma), 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/2015   Severe obesity (BMI 35.0-35.9 with comorbidity) ([place]) 06/01/2015   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   Hyperlipidemia associated with type 2 diabetes mellitus ([place]) 09/29/2013    Past Surgical History:  Procedure Laterality Date   ANKLE FRACTURE SURGERY     BASAL CELL CARCINOMA  EXCISION N/A    scalp   COLONOSCOPY W/ POLYPECTOMY     COLONOSCOPY WITH PROPOFOL N/A 01/18/2016   Procedure: COLONOSCOPY WITH PROPOFOL;  Surgeon: Lollie Sails, MD;  Location: [facility] ENDOSCOPY;  Service: Endoscopy;  Laterality: N/A;   COLONOSCOPY WITH PROPOFOL N/A 07/02/2018   Procedure: COLONOSCOPY WITH PROPOFOL;  Surgeon: Lollie Sails, MD;  Location: [facility] ENDOSCOPY;  Service: Endoscopy;  Laterality: N/A;   ESOPHAGOGASTRODUODENOSCOPY (EGD) WITH PROPOFOL N/A 01/18/2016   Procedure: ESOPHAGOGASTRODUODENOSCOPY (EGD) WITH PROPOFOL;  Surgeon: Lollie Sails, MD;  Location: [facility] ENDOSCOPY;  Service: Endoscopy;  Laterality: N/A;   FRACTURE SURGERY Right 1984   ankle   HERNIA REPAIR     UMBILICAL   LEG SURGERY Left    NASAL SEPTOPLASTY W/ TURBINOPLASTY Bilateral 11/06/2019   Procedure: NASAL SEPTOPLASTY WITH INFERIOR TURBINATE REDUCTION;  Surgeon: Margaretha Sheffield, MD;  Location: [facility] ORS;  Service: ENT;  Laterality: Bilateral;   toenail removal Bilateral        Home Medications    Prior to Admission medications   Medication Sig Start Date End Date Taking? Authorizing Provider  albuterol (PROVENTIL HFA;VENTOLIN HFA) 108 (90 Base) MCG/ACT inhaler Inhale 2 puffs into the lungs every 4 (four) hours as needed for wheezing or shortness of breath.   Yes [provider]  Bioflavonoid Products (ESTER C PO) Take 500 mg by mouth daily.  Yes [provider]  cephALEXin (KEFLEX) 500 MG capsule Take 1 capsule (500 mg total) by mouth 2 (two) times daily. 11/06/19  Yes Margaretha Sheffield, MD  Exenatide ER (BYDUREON BCISE) 2 MG/0.85ML AUIJ Inject 2 mg into the skin every Friday.  01/14/19  Yes [provider]  fluticasone (FLONASE) 50 MCG/ACT nasal spray Place 2 sprays into both nostrils daily. 05/31/18  Yes Crecencio Mc P, PA-C  glipiZIDE (GLUCOTROL XL) 5 MG 24 hr tablet Take 5 mg by mouth daily with breakfast.   Yes [provider]  metFORMIN (GLUCOPHAGE-XR) 500 MG 24 hr  tablet Take 1,000 mg by mouth 2 (two) times daily.    Yes [provider]  pantoprazole (PROTONIX) 40 MG tablet Take 40 mg by mouth daily as needed (heartburn).  01/09/17  Yes [provider]  predniSONE (DELTASONE) 10 MG tablet Start with 3 pills tomorrow. Taper over the next 6 days.  3,3,2,2,1,1. 11/06/19  Yes Margaretha Sheffield, MD  simvastatin (ZOCOR) 40 MG tablet Take 40 mg by mouth daily.   Yes [provider]  sulfamethoxazole-trimethoprim (BACTRIM DS) 800-160 MG tablet Take 1 tablet by mouth 2 (two) times daily for 7 days. 03/13/21 03/20/21 Yes Marney Setting, NP  traMADol (ULTRAM) 50 MG tablet Take 1 tablet (50 mg total) by mouth every 6 (six) hours as needed. 03/13/21  Yes Marney Setting, NP  triamterene-hydrochlorothiazide (MAXZIDE-25) 37.5-25 MG tablet Take 1 tablet by mouth daily.   Yes [provider]  valsartan (DIOVAN) 160 MG tablet Take 160 mg by mouth daily.   Yes [provider]  VITAMIN A PO Take 1 tablet by mouth daily.   Yes [provider]    Family History Family History  Problem Relation Age of Onset   Ovarian cancer Mother    Ulcers Father     Social History Social History   Tobacco Use   Smoking status: Never   Smokeless tobacco: Never  Vaping Use   Vaping Use: Never used  Substance Use Topics   Alcohol use: No   Drug use: No     Allergies   Amoxil [amoxicillin], Aspirin buf(cacarb-mgcarb-mgo), and Aspirin buffered   Review of Systems Review of Systems  Constitutional:  Positive for fever. Negative for appetite change and chills.  HENT:  Positive for dental problem.   Eyes: Negative.   Respiratory: Negative.    Cardiovascular: Negative.   Gastrointestinal: Negative.   Genitourinary: Negative.   Neurological: Negative.     Physical Exam Triage Vital Signs ED Triage Vitals  Enc Vitals Group     BP 03/13/21 1125 (!) 148/84     Pulse Rate 03/13/21 1125 89     Resp 03/13/21 1125 18      Temp 03/13/21 1125 99.8 F (37.7 C)     Temp Source 03/13/21 1125 Oral     SpO2 03/13/21 1125 100 %     Weight 03/13/21 1124 218 lb (98.9 kg)     Height 03/13/21 1124 5\' 6"  (1.676 m)     Head Circumference --      Peak Flow --      Pain Score 03/13/21 1124 3     Pain Loc --      Pain Edu? --      Excl. in [place]? --    No data found.  Updated Vital Signs BP (!) 148/84 (BP Location: Left Arm)   Pulse 89   Temp 99.8 F (37.7 C) (Oral)   Resp 18  Ht 5\' 6"  (1.676 m)   Wt 218 lb (98.9 kg)   SpO2 100%   BMI 35.19 kg/m   Visual Acuity Right Eye Distance:   Left Eye Distance:   Bilateral Distance:    Right Eye Near:   Left Eye Near:    Bilateral Near:     Physical Exam Constitutional:      Appearance: Normal appearance. He is obese.  HENT:     Right Ear: Tympanic membrane normal.     Left Ear: Tympanic membrane normal.     Nose: Nose normal.     Mouth/Throat:     Dentition: Dental tenderness and dental caries present.      Comments: Poor dental health , several dental carries with erythema and edema to area  Neurological:     Mental Status: He is alert.     UC Treatments / Results  Labs (all labs ordered are listed, but only abnormal results are displayed) Labs Reviewed - No data to display  EKG   Radiology No results found.  Procedures Procedures (including critical care time)  Medications Ordered in UC Medications - No data to display  Initial Impression / Assessment and Plan / UC Course  I have reviewed the triage vital signs and the nursing notes.  Pertinent labs & imaging results that were available during my care of the patient were reviewed by me and considered in my medical decision making (see chart for details).     You will need to be seen by your dentist  Take tylenol as needed for pain Take medications with food  If worse go to er   Final Clinical Impressions(s) / UC Diagnoses   Final diagnoses:  Dental caries  Dental infection   Closed fracture of tooth, initial encounter     Discharge Instructions      Call dentist to  move up appoint to be seen soon  Take full dose of abx with food  Go to er if sx become worse       ED Prescriptions     Medication Sig Dispense Auth. Provider   sulfamethoxazole-trimethoprim (BACTRIM DS) 800-160 MG tablet Take 1 tablet by mouth 2 (two) times daily for 7 days. 14 tablet Morley Kos L, NP   traMADol (ULTRAM) 50 MG tablet Take 1 tablet (50 mg total) by mouth every 6 (six) hours as needed. 15 tablet Marney Setting, NP      I have reviewed the PDMP during this encounter.   Marney Setting, NP 03/13/21 1304

## 2021-03-13 NOTE — Discharge Instructions (Signed)
Call dentist to  move up appoint to be seen soon  Take full dose of abx with food  Go to er if sx become worse

## 2021-04-07 ENCOUNTER — Other Ambulatory Visit: Payer: Self-pay | Admitting: Neurosurgery

## 2021-04-07 DIAGNOSIS — R42 Dizziness and giddiness: Secondary | ICD-10-CM

## 2021-04-07 DIAGNOSIS — M542 Cervicalgia: Secondary | ICD-10-CM

## 2021-04-15 ENCOUNTER — Ambulatory Visit
Admission: RE | Admit: 2021-04-15 | Discharge: 2021-04-15 | Disposition: A | Discharge status: Home / Self Care | Payer: Medicare Other | Source: Ambulatory Visit | Attending: Neurosurgery | Admitting: Neurosurgery

## 2021-04-15 ENCOUNTER — Other Ambulatory Visit: Payer: Self-pay

## 2021-04-15 DIAGNOSIS — R42 Dizziness and giddiness: Secondary | ICD-10-CM

## 2021-04-15 DIAGNOSIS — M542 Cervicalgia: Secondary | ICD-10-CM | POA: Diagnosis not present

## 2021-08-26 DIAGNOSIS — M4802 Spinal stenosis, cervical region: Secondary | ICD-10-CM | POA: Insufficient documentation

## 2021-08-27 ENCOUNTER — Other Ambulatory Visit: Payer: Self-pay | Admitting: Physical Medicine & Rehabilitation

## 2021-08-27 DIAGNOSIS — M9931 Osseous stenosis of neural canal of cervical region: Secondary | ICD-10-CM

## 2021-09-14 NOTE — Therapy (Signed)
OUTPATIENT PHYSICAL THERAPY SHOULDER EVALUATION   Patient Name: Bobby Chen MRN: [id number] DOB:May 09, 1945, 76 y.o., male Today's Date: 09/17/2021   PT End of Session - 09/17/21 1238     Visit Number 1    Number of Visits 17    Date for PT Re-Evaluation 11/10/21    Authorization Type eval: 09/15/21    PT Start Time 1100    PT Stop Time 1145    PT Time Calculation (min) 45 min    Activity Tolerance Patient tolerated treatment well    Behavior During Therapy [facility] for tasks assessed/performed             Past Medical History:  Diagnosis Date   Anginal pain ([place])    Brown recluse spider bite    Cancer ([place])    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, LEFT ARM    COPD (chronic obstructive pulmonary disease) ([place])    Diabetes mellitus without complication ([place])    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[place])    Elevated lipids    Hypertension    Obesity    Pulmonary arterial hypertension ([place])    Sleep apnea    Past Surgical History:  Procedure Laterality Date   ANKLE FRACTURE SURGERY     BASAL CELL CARCINOMA EXCISION N/A    scalp   COLONOSCOPY W/ POLYPECTOMY     COLONOSCOPY WITH PROPOFOL N/A 01/18/2016   Procedure: COLONOSCOPY WITH PROPOFOL;  Surgeon: Lollie Sails, MD;  Location: [facility] ENDOSCOPY;  Service: Endoscopy;  Laterality: N/A;   COLONOSCOPY WITH PROPOFOL N/A 07/02/2018   Procedure: COLONOSCOPY WITH PROPOFOL;  Surgeon: Lollie Sails, MD;  Location: [facility] ENDOSCOPY;  Service: Endoscopy;  Laterality: N/A;   ESOPHAGOGASTRODUODENOSCOPY (EGD) WITH PROPOFOL N/A 01/18/2016   Procedure: ESOPHAGOGASTRODUODENOSCOPY (EGD) WITH PROPOFOL;  Surgeon: Lollie Sails, MD;  Location: [facility] ENDOSCOPY;  Service: Endoscopy;  Laterality: N/A;   FRACTURE SURGERY Right 1984   ankle   HERNIA REPAIR     UMBILICAL   LEG SURGERY Left    NASAL SEPTOPLASTY W/ TURBINOPLASTY Bilateral 11/06/2019   Procedure: NASAL SEPTOPLASTY WITH INFERIOR TURBINATE REDUCTION;  Surgeon: Margaretha Sheffield, MD;  Location: [facility] ORS;  Service: ENT;   Laterality: Bilateral;   toenail removal Bilateral    Patient Active Problem List   Diagnosis Date Noted   Leg pain, right 08/26/2019   Chronic venous insufficiency 08/26/2019   PAD (peripheral artery disease) ([place]) 08/14/2019   Obstructive sleep apnea 05/06/2019   Obesity, Class II, BMI 35-39.9 03/11/2019   Type 2 diabetes mellitus with stage 3a chronic kidney disease, without long-term current use of insulin ([place]) 03/11/2019   Pain in joint involving ankle and foot 11/12/2018   Nuclear sclerotic cataract of both eyes 08/30/2018   Abnormal sensation in left ear 08/20/2018   Dizziness 08/20/2018   Numbness and tingling of left side of face 08/20/2018   Essential hypertension with goal blood pressure less than 130/80 03/19/2018   Healthcare maintenance 08/07/2017   Brown recluse spider bite 05/08/2017   SCC (squamous cell carcinoma), 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/2015   Severe obesity (BMI 35.0-35.9 with comorbidity) ([place]) 06/01/2015   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  SUBJECTIVE:                                                                                                                                                                                         Chief Complaint: R shoulder pain  Pertinent History Pt referred to PT for R shoulder pain by orthopedics. His pain began after a  fall at the end of March 2023. He was walking down steps and started leaning forward. He responded by leaning back and he fell backwards bracing himself with his arms. He experienced immediate R shoulder pain. He denies any additional falls in the last 6 months but he did have a fall about 1 year ago. He had an evaluation of his R shoulder pain at his PCP office. Plain films of his thoracic spine and ribs were obtained which did not show any rib fractures but multilevel disc changes in his thoracic spine. He reports that prior to his fall he was receiving physical therapy for his cervical stenosis with notable improvement in his pain and mobility. He was last evaluated by physiatry on 08/26/2021 for his neck pain that radiates into his right arm. This was suspected to be due to his nerve root impingement seen on recent cervical spine MRI. There is also question of carpal tunnel syndrome. He was recommended EMG of the bilateral upper extremities and epidural steroid injection to his cervical spine. Pt states that [facility] PT told him there were "unable to see him" for his R shoulder pain. Since his fall he has had more pain along his posterior right shoulder. The pain is "under my shoulder blade" and it radiates around his right trunk. He points to his lateral R ribs, scapula, and periscapular area mostly.  He denies associated swelling, shoulder locking/catching, shoulder instability, weakness, fevers or chills, night sweats, weight loss, skin color change. He has tried acetaminophen, [place] dream cream, activity modification, chronic medication nortriptyline/pregabalin/tramadol with continued symptoms. He is right hand dominant and is retired from being a Art gallery manager. He also was previously in the [employer]   Pain:  Pain Intensity: Present: 0/10, Best: 0/10, Worst: 7/10 Pain location: R thoracic paraspinal/periscapular region Pain Quality: dull  Radiating: Yes, around the R ribs  Numbness/Tingling: No,  He reports questionable minor numbness around R ribs Focal Weakness: No, not related to current R shoulder pain Aggravating factors: leaning to the left, standing forward from a chair, bending forward "makes me feel drunk," reaching out with RUE; Relieving factors: Tylenol,  topical analgesic provides temporary relief, no help with ice/heat,  24-hour pain behavior: "All day long" History of prior shoulder or neck/shoulder injury, pain, surgery, or therapy: Yes, see history Falls: Has patient fallen in last 6 months? Yes, Number of falls: 1 Dominant hand: right Imaging: Yes, Right Shoulder Radiographs - 3 views (Gr[place], Axillary, Scapular Y) performed 08/31/2021: Type I acromion. Mild AC joint degenerative change with osteophyte formation. The distal clavicle, scapula, and proximal humerus are intact. Glenohumeral and acromioclavicular joint alignment appears normal. No fractures or dislocations. Well maintained glenohumeral joint space without evidence of degenerative joint disease. No soft tissue swelling or joint effusion. No destructive bony lesion is identified.  Cervical spine MRI without contrast performed 04/15/2021 IMPRESSION: Cervical spondylosis, as outlined and with findings most notably as follows: At C5-C6, a moderate-sized central disc protrusion contributes to multifactorial severe spinal canal stenosis with moderate spinal cord flattening. No definite spinal cord signal abnormality is identified, although motion degradation limits evaluation. Multifactorial bilateral neural foraminal narrowing (moderate right, severe left). No more than mild relative spinal canal narrowing at the remaining levels. Of note, a right center disc protrusion contacts the ventral spinal cord at C6-C7. Additional sites of foraminal stenosis, as detailed and greatest on the left at C3-C4 (moderate) and on the right at C6-C7 (moderate).    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  Recent memory is intact.  Remote memory is intact.  Attention span and concentration are intact.  Expressive speech is intact.  Patient's fund of knowledge is within normal limits for educational level. Conversation is tangential with frequent diversions     Gross Musculoskeletal Assessment Tremor: None Bulk: Normal Tone: Normal Overweight  Gait Full gait assessment deferred  Posture Forward head/rounded shoulders in sitting and standing   Cervical Screen Spurlings A (ipsilateral lateral flexion/axial compression): R: Positive for reproduction of periscapular pain L: Positive Spurlings B (ipsilateral lateral flexion/contralateral rotation/axial compression): R: Positive for reproduction of periscapular pain L: Positive Repeated movement: Not performed Hoffman Sign (cervical cord compression): R: Negative L: Negative ULTT Median: R: Not examined L: Not examined ULTT Ulnar: R: Not examined L: Not examined ULTT Radial: R: Not examined L: Not examined   AROM  AROM (Normal range in degrees) AROM 09/17/2021  Cervical  Flexion (50) 43  Extension (80) 23*  Right lateral flexion (45) 35*  Left lateral flexion (45) 35  Right rotation (85)   Left rotation (85)    Right Left  Shoulder    Flexion    Extension    Abduction    External Rotation    Internal Rotation    Hands Behind Head    Hands Behind Back        Elbow    Flexion    Extension     Pronation    Supination    (* = pain; Blank rows = not tested)    LE MMT:  MMT (out of 5) Right 09/17/2021 Left 09/17/2021  Cervical (isometric)  Flexion Weak  Extension Weak  Lateral Flexion Weak Weak  Rotation Weak Weak      Shoulder   Flexion 4 4  Extension    Abduction 4+ 4  External rotation 3+* 3+  Internal rotation 5 5  Horizontal abduction    Horizontal adduction    Lower Trapezius    Rhomboids        Elbow  Flexion 5 5  Extension 5 5  Pronation    Supination        Wrist  Flexion 5 5  Extension 5 5  Radial deviation    Ulnar deviation        MCP  Flexion 5 5  Extension 5 5  Abduction    Adduction    (* = pain; Blank rows = not tested)  Positive reproduction of concordant pain with resisted L shoulder ER  Sensation Deferred  Reflexes Deferred   Palpation  Location LEFT  RIGHT           Subocciptials  0  Cervical paraspinals  2  Upper Trapezius    Levator Scapulae    Rhomboid Major/Minor  2  Sternoclavicular joint    Acromioclavicular joint    Coracoid process    Long head of biceps  2  Supraspinatus    Infraspinatus    Subscapularis    Teres Minor    Teres Major    Pectoralis Major    Pectoralis Minor    Anterior Deltoid  1  Lateral Deltoid    Posterior Deltoid    Latissimus Dorsi    Sternocleidomastoid    (Blank rows = not tested) Graded on 0-4 scale (0 = no pain, 1 = pain, 2 = pain with wincing/grimacing/flinching, 3 = pain with withdrawal, 4 = unwilling to allow palpation), (Blank rows = not tested)   Repeated Movements Deferred  Passive Accessory Intervertebral Motion Deferred   Accessory Motions/Glides Deferred   SPECIAL TESTS Rotator Cuff  Drop Arm Test: Negative Painful Arc (Pain from 60 to 120 degrees scaption): Negative Infraspinatus Muscle Test: Positive   Subacromial Impingement Deferred  Labral Tear Deferred  Bicep Tendon Pathology Deferred  Shoulder Instability Deferred   Beighton  scale: Deferred   PATIENT EDUCATION:  Education details: Plan of care Person educated: Patient Education method: Explanation Education comprehension: verbalized understanding   HOME EXERCISE PROGRAM: None currently   ASSESSMENT:  CLINICAL IMPRESSION: Patient is a 76 y.o. male who was seen today for physical therapy evaluation and treatment for shoulder pain. Objective impairments include decreased ROM, decreased strength, dizziness, obesity, and pain. These impairments are limiting patient from meal prep, cleaning, laundry, and community activity. Personal factors including Age, Past/current experiences, Time since onset of injury/illness/exacerbation, and 3+ comorbidities: DM, neuritis, PAD, OSA, COPD, and CKD  are also affecting patient's functional outcome. Patient will benefit from skilled PT to address above impairments and improve overall function.  REHAB POTENTIAL: Fair secondary to multiple comorbidities and ongoing chronic neck pain  CLINICAL DECISION MAKING: Unstable/unpredictable  EVALUATION COMPLEXITY: High   GOALS: Goals reviewed with patient? Yes  SHORT TERM GOALS: Target date: 10/14/2021   Pt will be independent with HEP to improve strength and decrease shoulder pain to improve pain-free function at home and work. Baseline:  Goal status: INITIAL   LONG TERM GOALS: Target date: 11/10/2021   Pt will increase FOTO to at least 62 to demonstrate significant improvement in function at home and work related to neck pain  Baseline: 09/15/21: 50 Goal status: INITIAL  2.  Pt will decrease worst shoulder pain by at least 3 points on the NPRS in order to demonstrate clinically significant reduction in shoulder pain. Baseline: 09/15/21: worst: 7/10; Goal status: INITIAL  3.  Pt will decrease quick DASH score by at least 8% in order to demonstrate clinically significant reduction in disability related to shoulder pain        Baseline: 09/15/21: 37.5% Goal status:  INITIAL  4. Pt will increase strength of pain-free R shoulder external rotation to at least 4/5 in order to demonstrate improvement in strength and function of RUE       Baseline: 09/15/21: 3+/5 painful Goal status: INITIAL   PLAN: PT FREQUENCY: 1-2x/week  PT DURATION: 8 weeks  PLANNED INTERVENTIONS: Therapeutic exercises, Therapeutic activity, Neuromuscular re-education, Balance training, Gait training, Patient/Family education, Joint manipulation, Joint mobilization, Vestibular training, Canalith repositioning, Aquatic Therapy, Dry Needling, Electrical stimulation, Spinal manipulation, Spinal mobilization, Cryotherapy, Moist heat, Traction, Ultrasound, Ionotophoresis '4mg'$ /ml Dexamethasone, and Manual therapy  PLAN FOR NEXT SESSION: Shoulder ROM, shoulder special tests, cervical and R shoulder PAM testing, initiate manual techniques and strengthening, issue HEP  Lyndel Safe Leiloni Smithers PT, DPT, GCS  Gedalya Jim 09/17/2021, 12:38 PM

## 2021-09-15 ENCOUNTER — Ambulatory Visit: Payer: Medicare Other | Attending: Internal Medicine

## 2021-09-15 DIAGNOSIS — M25511 Pain in right shoulder: Secondary | ICD-10-CM | POA: Insufficient documentation

## 2021-09-23 ENCOUNTER — Ambulatory Visit: Payer: Medicare Other | Attending: Sports Medicine

## 2021-09-23 DIAGNOSIS — M25511 Pain in right shoulder: Secondary | ICD-10-CM | POA: Insufficient documentation

## 2021-09-23 DIAGNOSIS — R42 Dizziness and giddiness: Secondary | ICD-10-CM | POA: Insufficient documentation

## 2021-09-23 NOTE — Therapy (Signed)
OUTPATIENT PHYSICAL THERAPY SHOULDER TREATMENT   Patient Name: Bobby Chen MRN: [id number] DOB:05/06/1945, 76 y.o., male Today's Date: 09/23/2021   PT End of Session - 09/23/21 0922     Visit Number 2    Number of Visits 17    Date for PT Re-Evaluation 11/10/21    Authorization Type eval: 09/15/21    PT Start Time 0930    PT Stop Time 1015    PT Time Calculation (min) 45 min    Activity Tolerance Patient tolerated treatment well    Behavior During Therapy [facility] for tasks assessed/performed              Past Medical History:  Diagnosis Date   Anginal pain ([place])    Brown recluse spider bite    Cancer ([place])    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, LEFT ARM    COPD (chronic obstructive pulmonary disease) ([place])    Diabetes mellitus without complication ([place])    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[place])    Elevated lipids    Hypertension    Obesity    Pulmonary arterial hypertension ([place])    Sleep apnea    Past Surgical History:  Procedure Laterality Date   ANKLE FRACTURE SURGERY     BASAL CELL CARCINOMA EXCISION N/A    scalp   COLONOSCOPY W/ POLYPECTOMY     COLONOSCOPY WITH PROPOFOL N/A 01/18/2016   Procedure: COLONOSCOPY WITH PROPOFOL;  Surgeon: Lollie Sails, MD;  Location: [facility] ENDOSCOPY;  Service: Endoscopy;  Laterality: N/A;   COLONOSCOPY WITH PROPOFOL N/A 07/02/2018   Procedure: COLONOSCOPY WITH PROPOFOL;  Surgeon: Lollie Sails, MD;  Location[facility] ENDOSCOPY;  Service: Endoscopy;  Laterality: N/A;   ESOPHAGOGASTRODUODENOSCOPY (EGD) WITH PROPOFOL N/A 01/18/2016   Procedure: ESOPHAGOGASTRODUODENOSCOPY (EGD) WITH PROPOFOL;  Surgeon: Lollie Sails, MD;  Location: [facility] ENDOSCOPY;  Service: Endoscopy;  Laterality: N/A;   FRACTURE SURGERY Right 1984   ankle   HERNIA REPAIR     UMBILICAL   LEG SURGERY Left    NASAL SEPTOPLASTY W/ TURBINOPLASTY Bilateral 11/06/2019   Procedure: NASAL SEPTOPLASTY WITH INFERIOR TURBINATE REDUCTION;  Surgeon: Margaretha Sheffield, MD;  Location: [facility] ORS;  Service: ENT;   Laterality: Bilateral;   toenail removal Bilateral    Patient Active Problem List   Diagnosis Date Noted   Leg pain, right 08/26/2019   Chronic venous insufficiency 08/26/2019   PAD (peripheral artery disease) ([place]) 08/14/2019   Obstructive sleep apnea 05/06/2019   Obesity, Class II, BMI 35-39.9 03/11/2019   Type 2 diabetes mellitus with stage 3a chronic kidney disease, without long-term current use of insulin ([place]) 03/11/2019   Pain in joint involving ankle and foot 11/12/2018   Nuclear sclerotic cataract of both eyes 08/30/2018   Abnormal sensation in left ear 08/20/2018   Dizziness 08/20/2018   Numbness and tingling of left side of face 08/20/2018   Essential hypertension with goal blood pressure less than 130/80 03/19/2018   Healthcare maintenance 08/07/2017   Brown recluse spider bite 05/08/2017   SCC (squamous cell carcinoma), 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/2015   Severe obesity (BMI 35.0-35.9 with comorbidity) ([place]) 06/01/2015   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[place]) 09/29/2013   Hyperlipidemia associated with type 2 diabetes mellitus ([place]) 09/29/2013    PCP: Kirk Ruths, MD  REFERRING PROVIDER: Kirk Ruths, MD  REFERRING DIAGNOSIS: M25.511 (ICD-10-CM) - Pain in right shoulder W19.XXXA (ICD-10-CM) - Unspecified fall, initial encounter M62.838 (ICD-10-CM) - Other muscle spasm M75.41 (ICD-10-CM) - Impingement syndrome of right shoulder   THERAPY DIAG: Right shoulder pain, unspecified  chronicity  RATIONALE FOR EVALUATION AND TREATMENT: Rehabilitation  ONSET DATE: 06/23/21 (approximate)  FOLLOW UP APPT WITH PROVIDER: Yes, returns in June 2023 to see [name]   From initial evaluation  SUBJECTIVE:                                                                                                                                                                                         Chief Complaint: R shoulder pain  Pertinent History Pt referred to PT for R shoulder pain by  orthopedics. His pain began after a fall at the end of March 2023. He was walking down steps and started leaning forward. He responded by leaning back and he fell backwards bracing himself with his arms. He experienced immediate R shoulder pain. He denies any additional falls in the last 6 months but he did have a fall about 1 year ago. He had an evaluation of his R shoulder pain at his PCP office. Plain films of his thoracic spine and ribs were obtained which did not show any rib fractures but multilevel disc changes in his thoracic spine. He reports that prior to his fall he was receiving physical therapy for his cervical stenosis with notable improvement in his pain and mobility. He was last evaluated by physiatry on 08/26/2021 for his neck pain that radiates into his right arm. This was suspected to be due to his nerve root impingement seen on recent cervical spine MRI. There is also question of carpal tunnel syndrome. He was recommended EMG of the bilateral upper extremities and epidural steroid injection to his cervical spine. Pt states that [facility] PT told him there were "unable to see him" for his R shoulder pain. Since his fall he has had more pain along his posterior right shoulder. The pain is "under my shoulder blade" and it radiates around his right trunk. He points to his lateral R ribs, scapula, and periscapular area mostly.  He denies associated swelling, shoulder locking/catching, shoulder instability, weakness, fevers or chills, night sweats, weight loss, skin color change. He has tried acetaminophen, [place] dream cream, activity modification, chronic medication nortriptyline/pregabalin/tramadol with continued symptoms. He is right hand dominant and is retired from being a Art gallery manager. He also was previously in the [employer]   Pain:  Pain Intensity: Present: 0/10, Best: 0/10, Worst: 7/10 Pain location: R thoracic paraspinal/periscapular region Pain Quality: dull  Radiating: Yes, around  the R ribs  Numbness/Tingling: No, He reports questionable minor numbness around R ribs Focal Weakness: No, not related to current R shoulder pain Aggravating factors: leaning to the left, standing forward from a chair, bending forward "makes me feel drunk,"  reaching out with RUE; Relieving factors: Tylenol, topical analgesic provides temporary relief, no help with ice/heat,  24-hour pain behavior: "All day long" History of prior shoulder or neck/shoulder injury, pain, surgery, or therapy: Yes, see history Falls: Has patient fallen in last 6 months? Yes, Number of falls: 1 Dominant hand: right Imaging: Yes, Right Shoulder Radiographs - 3 views (Grashey, Axillary, Scapular Y) performed 08/31/2021: Type I acromion. Mild AC joint degenerative change with osteophyte formation. The distal clavicle, scapula, and proximal humerus are intact. Glenohumeral and acromioclavicular joint alignment appears normal. No fractures or dislocations. Well maintained glenohumeral joint space without evidence of degenerative joint disease. No soft tissue swelling or joint effusion. No destructive bony lesion is identified.  Cervical spine MRI without contrast performed 04/15/2021 IMPRESSION: Cervical spondylosis, as outlined and with findings most notably as follows: At C5-C6, a moderate-sized central disc protrusion contributes to multifactorial severe spinal canal stenosis with moderate spinal cord flattening. No definite spinal cord signal abnormality is identified, although motion degradation limits evaluation. Multifactorial bilateral neural foraminal narrowing (moderate right, severe left). No more than mild relative spinal canal narrowing at the remaining levels. Of note, a right center disc protrusion contacts the ventral spinal cord at C6-C7. Additional sites of foraminal stenosis, as detailed and greatest on the left at C3-C4 (moderate) and on the right at C6-C7 (moderate).    Prior level of function:  Independent Occupational demands: Retired Chief Executive Officer: Working in his Survey[employer], quantity flags: Positive: chills (6-8 months), night sweats, skin cancer; Negative: nausea, vomiting  Precautions: None  Weight Bearing Restrictions: No  Living Environment Lives with: lives with their spouse Lives in: House/apartment, 2 steps to enter with a post to hold on the right side  Patient Goals: Decrease pain   OBJECTIVE:   Patient Surveys  FOTO: 18, predicted improvement to 62 QuickDASH: 37.5%  Cognition Patient is oriented to person, place, and time.  Recent memory is intact.  Remote memory is intact.  Attention span and concentration are intact.  Expressive speech is intact.  Patient's fund of knowledge is within normal limits for educational level. Conversation is tangential with frequent diversions     Gross Musculoskeletal Assessment Tremor: None Bulk: Normal Tone: Normal Overweight  Gait Full gait assessment deferred  Posture Forward head/rounded shoulders in sitting and standing   Cervical Screen Spurlings A (ipsilateral lateral flexion/axial compression): R: Positive for reproduction of periscapular pain L: Positive Spurlings B (ipsilateral lateral flexion/contralateral rotation/axial compression): R: Positive for reproduction of periscapular pain L: Positive Repeated movement: Not performed Hoffman Sign (cervical cord compression): R: Negative L: Negative ULTT Median: R: Not examined L: Not examined ULTT Ulnar: R: Not examined L: Not examined ULTT Radial: R: Not examined L: Not examined   AROM  AROM (Normal range in degrees) AROM 09/23/2021  Cervical  Flexion (50) 43  Extension (80) 23*  Right lateral flexion (45) 35*  Left lateral flexion (45) 35  Right rotation (85)   Left rotation (85)    Right Left  Shoulder    Flexion    Extension    Abduction    External Rotation    Internal Rotation    Hands Behind Head    Hands Behind Back         Elbow    Flexion    Extension    Pronation    Supination    (* = pain; Blank rows = not tested)    LE MMT:  MMT (out of 5) Right 09/23/2021 Left  09/23/2021  Cervical (isometric)  Flexion Weak  Extension Weak  Lateral Flexion Weak Weak  Rotation Weak Weak      Shoulder   Flexion 4 4  Extension    Abduction 4+ 4  External rotation 3+* 3+  Internal rotation 5 5  Horizontal abduction    Horizontal adduction    Lower Trapezius    Rhomboids        Elbow  Flexion 5 5  Extension 5 5  Pronation    Supination        Wrist  Flexion 5 5  Extension 5 5  Radial deviation    Ulnar deviation        MCP  Flexion 5 5  Extension 5 5  Abduction    Adduction    (* = pain; Blank rows = not tested)  Positive reproduction of concordant pain with resisted L shoulder ER  Sensation Deferred  Reflexes Deferred   Palpation  Location LEFT  RIGHT           Subocciptials  0  Cervical paraspinals  2  Upper Trapezius    Levator Scapulae    Rhomboid Major/Minor  2  Sternoclavicular joint    Acromioclavicular joint    Coracoid process    Long head of biceps  2  Supraspinatus    Infraspinatus    Subscapularis    Teres Minor    Teres Major    Pectoralis Major    Pectoralis Minor    Anterior Deltoid  1  Lateral Deltoid    Posterior Deltoid    Latissimus Dorsi    Sternocleidomastoid    (Blank rows = not tested) Graded on 0-4 scale (0 = no pain, 1 = pain, 2 = pain with wincing/grimacing/flinching, 3 = pain with withdrawal, 4 = unwilling to allow palpation), (Blank rows = not tested)   Repeated Movements Deferred  Passive Accessory Intervertebral Motion Deferred   Accessory Motions/Glides Deferred   SPECIAL TESTS Rotator Cuff  Drop Arm Test: Negative Painful Arc (Pain from 60 to 120 degrees scaption): Negative Infraspinatus Muscle Test: Positive   Subacromial Impingement Deferred  Labral Tear Deferred  Bicep Tendon Pathology Deferred  Shoulder  Instability Deferred   Beighton scale: Deferred   TODAY'S TREATMENT   SUBJECTIVE: Pt reports that he is doing well today. No significant changes since the initial evaluation. No resting R shoulder pain upon arrival but pain does increase with activity. No specific questions or concerns currently.  PAIN: Denies at rest   Ther-ex   AROM  AROM (Normal range in degrees) AROM 09/23/2021  Cervical  Flexion (50) 43  Extension (80) 23*  Right lateral flexion (45) 35*  Left lateral flexion (45) 35  Right rotation (85)   Left rotation (85)    Right Left  Shoulder    Flexion 160* 160*  Extension    Abduction 145* 120*  External Rotation 60* 55*  Internal Rotation 86 85  Hands Behind Head    Hands Behind Back        Elbow    Flexion WNL   Extension WNL   Pronation    Supination    (* = pain; Blank rows = not tested)   Passive Accessory Intervertebral Motion Pt denies reproduction of shoulder pain with CPA C2-T7 and R UPA C2-T7 however he is tender centrally to pressure. Generally, hypomobile throughout however difficult to assess mobility secondary to pain;  Accessory Motions/Glides Glenohumeral: Posterior: R: Painful, unable to fully assess L: not  examined Inferior: R: abnormal, hypomobile and painful L: not examined Anterior: R: normal L: not examined  Acromioclavicular:  Posterior: R: Too painful to assess fully  L: not examined Anterior: R: Too painful to assess fully L: not examined  Sternoclavicular: Posterior: R: Too painful to assess fully L: not examined Anterior: R: Too painful to assess fully L: not examined Superior: R: Too painful to assess fully L: not examined Inferior: R: Too painful to assess fully L: not examined  Scapulothoracic: Painful in all directions so unable to fully assess mobility;   Muscle Length Testing Deferred   SPECIAL TESTS (all on right side)  Subacromial Impingement Hawkins-Kennedy: Positive Neer (Block scapula, PROM  flexion): Positive Painful Arc (Pain from 60 to 120 degrees scaption): Negative Empty Can: Positive External Rotation Resistance: Positive Horizontal Adduction: Negative Scapular Assist: Not examined  Labral Tear Biceps Load II (120 elevation, full ER, 90 elbow flexion, full supination, resisted elbow flexion): Negative Crank (160 scaption, axial load with IR/ER): Positive Active Compression Test: Negative  Bicep Tendon Pathology Speed (shoulder flexion to 90, external rotation, full elbow extension, and forearm supination with resistance: Negative Yergason's (resisted shoulder ER and supination/biceps tendon pathology): Not examined  Shoulder Instability Sulcus Sign: Positive Anterior Apprehension: Negative   Supine R shoulder flexion with 1# dumbbell (DB) 2 x 10; Supine R shoulder circles (CW and CCW) with 1# DB 2 x 10; Supine R shoulder serratus punch with 1# DB 2 x 10; L sidelying R shoulder abduction with 1# DB 2 x 10; L sidelying R shoulder ER with 1# DB 2 x 10; Seated rows with red tband 2 x 10; Seated R shoulder extension with red tband 2 x 10; Seated "W's" with red tband 2 x 10; Seated scapular retractions 2 x 10; Seated Nautilus lat pull down with 40# x 10, 50# x 10; HEP issued and reviewed with patient;   PATIENT EDUCATION:  Education details: Plan of care, HEP, Pt educated throughout session about proper posture and technique with exercises. Improved exercise technique, movement at target joints, use of target muscles after min to mod verbal, visual, tactile cues.  Person educated: Patient Education method: Explanation, verbal cues, tactile cues, and handout Education comprehension: verbalized understanding,    HOME EXERCISE PROGRAM: Access Code: [email or web address] URL: [email or web address] Date: 09/23/2021 Prepared by: Roxana Hires  Exercises - Seated Scapular Retraction  - 1 x daily - 7 x weekly - 2 sets - 10 reps - 2s hold - Standing Shoulder Row  with Anchored Resistance  - 1 x daily - 7 x weekly - 2 sets - 10 reps - 2s hold - Shoulder W - External Rotation with Resistance  - 1 x daily - 7 x weekly - 2 sets - 10 reps - 2s hold   ASSESSMENT:  CLINICAL IMPRESSION: Performed additional ROM measurements as well as special testing which is suggestive of R shoulder SAIS. Initiated pain-free strengthening during session and HEP issued. Pt encouraged to follow-up as scheduled. Pt will benefit from PT services to address deficits in strength, pain, and range of motion in order to return to full function at home.    REHAB POTENTIAL: Fair secondary to multiple comorbidities and ongoing chronic neck pain  CLINICAL DECISION MAKING: Unstable/unpredictable  EVALUATION COMPLEXITY: High   GOALS: Goals reviewed with patient? Yes  SHORT TERM GOALS: Target date: 10/14/2021   Pt will be independent with HEP to improve strength and decrease shoulder pain to improve pain-free function at home and work. Baseline:  Goal  status: INITIAL   LONG TERM GOALS: Target date: 11/10/2021   Pt will increase FOTO to at least 62 to demonstrate significant improvement in function at home and work related to neck pain  Baseline: 09/15/21: 50 Goal status: INITIAL  2.  Pt will decrease worst shoulder pain by at least 3 points on the NPRS in order to demonstrate clinically significant reduction in shoulder pain. Baseline: 09/15/21: worst: 7/10; Goal status: INITIAL  3.  Pt will decrease quick DASH score by at least 8% in order to demonstrate clinically significant reduction in disability related to shoulder pain        Baseline: 09/15/21: 37.5% Goal status: INITIAL  4. Pt will increase strength of pain-free R shoulder external rotation to at least 4/5 in order to demonstrate improvement in strength and function of RUE       Baseline: 09/15/21: 3+/5 painful Goal status: INITIAL   PLAN: PT FREQUENCY: 1-2x/week  PT DURATION: 8 weeks  PLANNED INTERVENTIONS:  Therapeutic exercises, Therapeutic activity, Neuromuscular re-education, Balance training, Gait training, Patient/Family education, Joint manipulation, Joint mobilization, Vestibular training, Canalith repositioning, Aquatic Therapy, Dry Needling, Electrical stimulation, Spinal manipulation, Spinal mobilization, Cryotherapy, Moist heat, Traction, Ultrasound, Ionotophoresis '4mg'$ /ml Dexamethasone, and Manual therapy  PLAN FOR NEXT SESSION: continue manual techniques and strengthening (primary focus), review and modify HEP as needed  Lyndel Safe Arien Morine PT, DPT, GCS  Lyndell Allaire 09/23/2021, 11:15 AM

## 2021-09-28 ENCOUNTER — Ambulatory Visit: Payer: Medicare Other

## 2021-09-28 DIAGNOSIS — M25511 Pain in right shoulder: Secondary | ICD-10-CM | POA: Diagnosis not present

## 2021-09-28 NOTE — Therapy (Signed)
OUTPATIENT PHYSICAL THERAPY SHOULDER TREATMENT   Patient Name: Bobby Chen MRN: [id number] DOB:[birth year], 76 y.o., male Today's Date: 09/28/2021   PT End of Session - 09/28/21 0937     Visit Number 3    Number of Visits 17    Date for PT Re-Evaluation 11/10/21    Authorization Type eval: 09/15/21    PT Start Time 0934    PT Stop Time 1015    PT Time Calculation (min) 41 min    Activity Tolerance Patient tolerated treatment well    Behavior During Therapy [facility] for tasks assessed/performed              Past Medical History:  Diagnosis Date   Anginal pain ([place])    Brown recluse spider bite    Cancer ([place])    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, LEFT ARM    COPD (chronic obstructive pulmonary disease) ([place])    Diabetes mellitus without complication ([place])    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[place])    Elevated lipids    Hypertension    Obesity    Pulmonary arterial hypertension ([place])    Sleep apnea    Past Surgical History:  Procedure Laterality Date   ANKLE FRACTURE SURGERY     BASAL CELL CARCINOMA EXCISION N/A    scalp   COLONOSCOPY W/ POLYPECTOMY     COLONOSCOPY WITH PROPOFOL N/A 01/18/2016   Procedure: COLONOSCOPY WITH PROPOFOL;  Surgeon: Lollie Sails, MD;  Location: [facility] ENDOSCOPY;  Service: Endoscopy;  Laterality: N/A;   COLONOSCOPY WITH PROPOFOL N/A 07/02/2018   Procedure: COLONOSCOPY WITH PROPOFOL;  Surgeon: Lollie Sails, MD;  Location: [facility] ENDOSCOPY;  Service: Endoscopy;  Laterality: N/A;   ESOPHAGOGASTRODUODENOSCOPY (EGD) WITH PROPOFOL N/A 01/18/2016   Procedure: ESOPHAGOGASTRODUODENOSCOPY (EGD) WITH PROPOFOL;  Surgeon: Lollie Sails, MD;  Location: [facility] ENDOSCOPY;  Service: Endoscopy;  Laterality: N/A;   FRACTURE SURGERY Right 1984   ankle   HERNIA REPAIR     UMBILICAL   LEG SURGERY Left    NASAL SEPTOPLASTY W/ TURBINOPLASTY Bilateral 11/06/2019   Procedure: NASAL SEPTOPLASTY WITH INFERIOR TURBINATE REDUCTION;  Surgeon: Margaretha Sheffield, MD;  Location: [facility] ORS;  Service: ENT;   Laterality: Bilateral;   toenail removal Bilateral    Patient Active Problem List   Diagnosis Date Noted   Leg pain, right 08/26/2019   Chronic venous insufficiency 08/26/2019   PAD (peripheral artery disease) ([place]) 08/14/2019   Obstructive sleep apnea 05/06/2019   Obesity, Class II, BMI 35-39.9 03/11/2019   Type 2 diabetes mellitus with stage 3a chronic kidney disease, without long-term current use of insulin ([place]) 03/11/2019   Pain in joint involving ankle and foot 11/12/2018   Nuclear sclerotic cataract of both eyes 08/30/2018   Abnormal sensation in left ear 08/20/2018   Dizziness 08/20/2018   Numbness and tingling of left side of face 08/20/2018   Essential hypertension with goal blood pressure less than 130/80 03/19/2018   Healthcare maintenance 08/07/2017   Brown recluse spider bite 05/08/2017   SCC (squamous cell carcinoma), 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/2015   Severe obesity (BMI 35.0-35.9 with comorbidity) ([place]) 06/01/2015   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[place]) 09/29/2013   Hyperlipidemia associated with type 2 diabetes mellitus ([place]) 09/29/2013    PCP: Kirk Ruths, MD  REFERRING PROVIDER: Kirk Ruths, MD  REFERRING DIAGNOSIS: M25.511 (ICD-10-CM) - Pain in right shoulder W19.XXXA (ICD-10-CM) - Unspecified fall, initial encounter M62.838 (ICD-10-CM) - Other muscle spasm M75.41 (ICD-10-CM) - Impingement syndrome of right shoulder   THERAPY DIAG: Right shoulder pain, unspecified  chronicity  RATIONALE FOR EVALUATION AND TREATMENT: Rehabilitation  ONSET DATE: 06/23/21 (approximate)  FOLLOW UP APPT WITH PROVIDER: Yes, returns in June 2023 to see [name]   From initial evaluation  SUBJECTIVE:                                                                                                                                                                                         Chief Complaint: R shoulder pain  Pertinent History Pt referred to PT for R shoulder pain by  orthopedics. His pain began after a fall at the end of March 2023. He was walking down steps and started leaning forward. He responded by leaning back and he fell backwards bracing himself with his arms. He experienced immediate R shoulder pain. He denies any additional falls in the last 6 months but he did have a fall about 1 year ago. He had an evaluation of his R shoulder pain at his PCP office. Plain films of his thoracic spine and ribs were obtained which did not show any rib fractures but multilevel disc changes in his thoracic spine. He reports that prior to his fall he was receiving physical therapy for his cervical stenosis with notable improvement in his pain and mobility. He was last evaluated by physiatry on 08/26/2021 for his neck pain that radiates into his right arm. This was suspected to be due to his nerve root impingement seen on recent cervical spine MRI. There is also question of carpal tunnel syndrome. He was recommended EMG of the bilateral upper extremities and epidural steroid injection to his cervical spine. Pt states that [facility] PT told him there were "unable to see him" for his R shoulder pain. Since his fall he has had more pain along his posterior right shoulder. The pain is "under my shoulder blade" and it radiates around his right trunk. He points to his lateral R ribs, scapula, and periscapular area mostly.  He denies associated swelling, shoulder locking/catching, shoulder instability, weakness, fevers or chills, night sweats, weight loss, skin color change. He has tried acetaminophen, [place] dream cream, activity modification, chronic medication nortriptyline/pregabalin/tramadol with continued symptoms. He is right hand dominant and is retired from being a Art gallery manager. He also was previously in the [employer]   Pain:  Pain Intensity: Present: 0/10, Best: 0/10, Worst: 7/10 Pain location: R thoracic paraspinal/periscapular region Pain Quality: dull  Radiating: Yes, around  the R ribs  Numbness/Tingling: No, He reports questionable minor numbness around R ribs Focal Weakness: No, not related to current R shoulder pain Aggravating factors: leaning to the left, standing forward from a chair, bending forward "makes me feel drunk,"  reaching out with RUE; Relieving factors: Tylenol, topical analgesic provides temporary relief, no help with ice/heat,  24-hour pain behavior: "All day long" History of prior shoulder or neck/shoulder injury, pain, surgery, or therapy: Yes, see history Falls: Has patient fallen in last 6 months? Yes, Number of falls: 1 Dominant hand: right Imaging: Yes, Right Shoulder Radiographs - 3 views (Grashey, Axillary, Scapular Y) performed 08/31/2021: Type I acromion. Mild AC joint degenerative change with osteophyte formation. The distal clavicle, scapula, and proximal humerus are intact. Glenohumeral and acromioclavicular joint alignment appears normal. No fractures or dislocations. Well maintained glenohumeral joint space without evidence of degenerative joint disease. No soft tissue swelling or joint effusion. No destructive bony lesion is identified.  Cervical spine MRI without contrast performed 04/15/2021 IMPRESSION: Cervical spondylosis, as outlined and with findings most notably as follows: At C5-C6, a moderate-sized central disc protrusion contributes to multifactorial severe spinal canal stenosis with moderate spinal cord flattening. No definite spinal cord signal abnormality is identified, although motion degradation limits evaluation. Multifactorial bilateral neural foraminal narrowing (moderate right, severe left). No more than mild relative spinal canal narrowing at the remaining levels. Of note, a right center disc protrusion contacts the ventral spinal cord at C6-C7. Additional sites of foraminal stenosis, as detailed and greatest on the left at C3-C4 (moderate) and on the right at C6-C7 (moderate).    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  Recent memory is intact.  Remote memory is intact.  Attention span and concentration are intact.  Expressive speech is intact.  Patient's fund of knowledge is within normal limits for educational level. Conversation is tangential with frequent diversions     Gross Musculoskeletal Assessment Tremor: None Bulk: Normal Tone: Normal Overweight  Gait Full gait assessment deferred  Posture Forward head/rounded shoulders in sitting and standing   Cervical Screen Spurlings A (ipsilateral lateral flexion/axial compression): R: Positive for reproduction of periscapular pain L: Positive Spurlings B (ipsilateral lateral flexion/contralateral rotation/axial compression): R: Positive for reproduction of periscapular pain L: Positive Repeated movement: Not performed Hoffman Sign (cervical cord compression): R: Negative L: Negative ULTT Median: R: Not examined L: Not examined ULTT Ulnar: R: Not examined L: Not examined ULTT Radial: R: Not examined L: Not examined   AROM  AROM (Normal range in degrees) AROM 09/28/2021  Cervical  Flexion (50) 43  Extension (80) 23*  Right lateral flexion (45) 35*  Left lateral flexion (45) 35  Right rotation (85)   Left rotation (85)    Right Left  Shoulder    Flexion    Extension    Abduction    External Rotation    Internal Rotation    Hands Behind Head    Hands Behind Back         Elbow    Flexion    Extension    Pronation    Supination    (* = pain; Blank rows = not tested)    LE MMT:  MMT (out of 5) Right 09/28/2021 Left  09/28/2021  Cervical (isometric)  Flexion Weak  Extension Weak  Lateral Flexion Weak Weak  Rotation Weak Weak      Shoulder   Flexion 4 4  Extension    Abduction 4+ 4  External rotation 3+* 3+  Internal rotation 5 5  Horizontal abduction    Horizontal adduction    Lower Trapezius    Rhomboids        Elbow  Flexion 5 5  Extension 5 5  Pronation    Supination        Wrist  Flexion 5 5  Extension 5 5  Radial deviation    Ulnar deviation        MCP  Flexion 5 5  Extension 5 5  Abduction    Adduction    (* = pain; Blank rows = not tested)  Positive reproduction of concordant pain with resisted L shoulder ER  Sensation Deferred  Reflexes Deferred   Palpation  Location LEFT  RIGHT           Subocciptials  0  Cervical paraspinals  2  Upper Trapezius    Levator Scapulae    Rhomboid Major/Minor  2  Sternoclavicular joint    Acromioclavicular joint    Coracoid process    Long head of biceps  2  Supraspinatus    Infraspinatus    Subscapularis    Teres Minor    Teres Major    Pectoralis Major    Pectoralis Minor    Anterior Deltoid  1  Lateral Deltoid    Posterior Deltoid    Latissimus Dorsi    Sternocleidomastoid    (Blank rows = not tested) Graded on 0-4 scale (0 = no pain, 1 = pain, 2 = pain with wincing/grimacing/flinching, 3 = pain with withdrawal, 4 = unwilling to allow palpation), (Blank rows = not tested)   Repeated Movements Deferred  Passive Accessory Intervertebral Motion Deferred   Accessory Motions/Glides Deferred   SPECIAL TESTS Rotator Cuff  Drop Arm Test: Negative Painful Arc (Pain from 60 to 120 degrees scaption): Negative Infraspinatus Muscle Test: Positive   Subacromial Impingement Deferred  Labral Tear Deferred  Bicep Tendon Pathology Deferred  Shoulder  Instability Deferred   Beighton scale: Deferred  FROM 09/23/21 VISIT:  AROM  AROM (Normal range in degrees) AROM 09/23/2021  Cervical  Flexion (50) 43  Extension (80) 23*  Right lateral flexion (45) 35*  Left lateral flexion (45) 35  Right rotation (85)   Left rotation (85)    Right Left  Shoulder    Flexion 160* 160*  Extension    Abduction 145* 120*  External Rotation 60* 55*  Internal Rotation 86 85  Hands Behind Head    Hands Behind Back        Elbow    Flexion WNL   Extension WNL   Pronation    Supination    (* = pain; Blank rows = not tested)   Passive Accessory Intervertebral Motion Pt denies reproduction of shoulder pain with CPA C2-T7 and R UPA C2-T7 however he is tender centrally to pressure. Generally, hypomobile throughout however difficult to assess mobility secondary to pain;  Accessory Motions/Glides Glenohumeral: Posterior: R: Painful, unable to fully assess L: not examined Inferior: R: abnormal, hypomobile and painful L: not examined Anterior: R: normal L: not examined  Acromioclavicular:  Posterior: R: Too painful to assess fully  L: not examined Anterior: R: Too painful to assess fully L: not examined  Sternoclavicular: Posterior: R: Too painful  to assess fully L: not examined Anterior: R: Too painful to assess fully L: not examined Superior: R: Too painful to assess fully L: not examined Inferior: R: Too painful to assess fully L: not examined  Scapulothoracic: Painful in all directions so unable to fully assess mobility;   Muscle Length Testing Deferred   SPECIAL TESTS (all on right side)  Subacromial Impingement Hawkins-Kennedy: Positive Neer (Block scapula, PROM flexion): Positive Painful Arc (Pain from 60 to 120 degrees scaption): Negative Empty Can: Positive External Rotation Resistance: Positive Horizontal Adduction: Negative Scapular Assist: Not examined  Labral Tear Biceps Load II (120 elevation, full ER, 90 elbow  flexion, full supination, resisted elbow flexion): Negative Crank (160 scaption, axial load with IR/ER): Positive Active Compression Test: Negative  Bicep Tendon Pathology Speed (shoulder flexion to 90, external rotation, full elbow extension, and forearm supination with resistance: Negative Yergason's (resisted shoulder ER and supination/biceps tendon pathology): Not examined  Shoulder Instability Sulcus Sign: Positive Anterior Apprehension: Negative    TODAY'S TREATMENT   SUBJECTIVE: Pt reports that he is doing well today. No significant changes since the last therapy session. No resting R shoulder pain upon arrival. He continues to report dizziness with position changes. No specific questions currently.  PAIN: Denies at rest  Neuromuscular Re-education  Dix-Hallpike (on inverted mat table) and Roll Tests are negative bilaterally  Orthostatic Vitals: Supine: BP: 132/72, HR: 78, SpO2%: 97% Seated: BP: 140/62 , HR: 77 , SpO2%: 98% Standing (1 minute): BP: 146/70 , HR: 80, SpO2%: 96% Standing (3 minutes): BP: 140/77, HR: 81, SpO2%: 97%   Ther-ex  UBE x 5 minutes for arm-up during interval history (2.5 min forward/2.5 min backward); Supine R shoulder flexion with 2# dumbbell (DB) 2 x 10; Supine R shoulder circles (CW and CCW) with 2# DB 2 x 10; Supine R shoulder serratus punch with 2# DB 2 x 10; L sidelying R shoulder abduction with 2# DB 2 x 10; L sidelying R shoulder ER with 2# DB 2 x 10;   Not performed today: Seated rows with red tband 2 x 10; Seated R shoulder extension with red tband 2 x 10; Seated "W's" with red tband 2 x 10; Seated scapular retractions 2 x 10; Seated Nautilus lat pull down with 40# x 10, 50# x 10;   PATIENT EDUCATION:  Education details: Pt educated throughout session about proper posture and technique with exercises. Improved exercise technique, movement at target joints, use of target muscles after min to mod verbal, visual, tactile cues.  Education about different possible causes for patient's dizziness. Person educated: Patient Education method: Explanation, verbal cues, tactile cues, and handout Education comprehension: verbalized understanding,    HOME EXERCISE PROGRAM: Access Code: [email or web address] URL: [email or web address] Date: 09/23/2021 Prepared by: Roxana Hires  Exercises - Seated Scapular Retraction  - 1 x daily - 7 x weekly - 2 sets - 10 reps - 2s hold - Standing Shoulder Row with Anchored Resistance  - 1 x daily - 7 x weekly - 2 sets - 10 reps - 2s hold - Shoulder W - External Rotation with Resistance  - 1 x daily - 7 x weekly - 2 sets - 10 reps - 2s hold   ASSESSMENT:  CLINICAL IMPRESSION: Pt reports continued dizziness with position changes so performed BPPV testing with patient during session today. All BPPV testing is negative on this date. Also performed orthostatic vitals which are negative for orthostatic hypotension. Afterwards proceeded to perform additional R upper quarter strengthening. Pt is able to  progress to 2# dumbbells and denies any pain during exercises. Pt encouraged to continue HEP and follow-up as scheduled. Pt will benefit from PT services to address deficits in strength, pain, and range of motion in order to return to full function at home.    REHAB POTENTIAL: Fair secondary to multiple comorbidities and ongoing chronic neck pain  CLINICAL DECISION MAKING: Unstable/unpredictable  EVALUATION COMPLEXITY: High   GOALS: Goals reviewed with patient? Yes  SHORT TERM GOALS: Target date: 10/14/2021   Pt will be independent with HEP to improve strength and decrease shoulder pain to improve pain-free function at home and work. Baseline:  Goal status: INITIAL   LONG TERM GOALS: Target date: 11/10/2021   Pt will increase FOTO to at least 62 to demonstrate significant improvement in function at home and work related to neck pain  Baseline: 09/15/21: 50 Goal status: INITIAL  2.   Pt will decrease worst shoulder pain by at least 3 points on the NPRS in order to demonstrate clinically significant reduction in shoulder pain. Baseline: 09/15/21: worst: 7/10; Goal status: INITIAL  3.  Pt will decrease quick DASH score by at least 8% in order to demonstrate clinically significant reduction in disability related to shoulder pain        Baseline: 09/15/21: 37.5% Goal status: INITIAL  4. Pt will increase strength of pain-free R shoulder external rotation to at least 4/5 in order to demonstrate improvement in strength and function of RUE       Baseline: 09/15/21: 3+/5 painful Goal status: INITIAL   PLAN: PT FREQUENCY: 1-2x/week  PT DURATION: 8 weeks  PLANNED INTERVENTIONS: Therapeutic exercises, Therapeutic activity, Neuromuscular re-education, Balance training, Gait training, Patient/Family education, Joint manipulation, Joint mobilization, Vestibular training, Canalith repositioning, Aquatic Therapy, Dry Needling, Electrical stimulation, Spinal manipulation, Spinal mobilization, Cryotherapy, Moist heat, Traction, Ultrasound, Ionotophoresis '4mg'$ /ml Dexamethasone, and Manual therapy  PLAN FOR NEXT SESSION: continue manual techniques and strengthening (primary focus), review and modify HEP as needed  Lyndel Safe Vantasia Pinkney PT, DPT, GCS  Kenika Sahm 09/28/2021, 10:43 AM

## 2021-09-30 ENCOUNTER — Ambulatory Visit: Payer: Medicare Other

## 2021-09-30 DIAGNOSIS — R42 Dizziness and giddiness: Secondary | ICD-10-CM

## 2021-09-30 DIAGNOSIS — M25511 Pain in right shoulder: Secondary | ICD-10-CM | POA: Diagnosis not present

## 2021-09-30 NOTE — Therapy (Signed)
OUTPATIENT PHYSICAL THERAPY SHOULDER TREATMENT   Patient Name: Bobby Chen MRN: [id number] DOB:11/13/1945, 76 y.o., male Today's Date: 09/30/2021   PT End of Session - 09/30/21 1030     Visit Number 4    Number of Visits 17    Date for PT Re-Evaluation 11/10/21    Authorization Type eval: 09/15/21    PT Start Time 0930    PT Stop Time 1015    PT Time Calculation (min) 45 min    Activity Tolerance Patient tolerated treatment well    Behavior During Therapy [facility] for tasks assessed/performed              Past Medical History:  Diagnosis Date   Anginal pain ([place])    Brown recluse spider bite    Cancer ([place])    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, LEFT ARM    COPD (chronic obstructive pulmonary disease) ([place])    Diabetes mellitus without complication ([place])    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[place])    Elevated lipids    Hypertension    Obesity    Pulmonary arterial hypertension ([place])    Sleep apnea    Past Surgical History:  Procedure Laterality Date   ANKLE FRACTURE SURGERY     BASAL CELL CARCINOMA EXCISION N/A    scalp   COLONOSCOPY W/ POLYPECTOMY     COLONOSCOPY WITH PROPOFOL N/A 01/18/2016   Procedure: COLONOSCOPY WITH PROPOFOL;  Surgeon: Lollie Sails, MD;  Location: [facility] ENDOSCOPY;  Service: Endoscopy;  Laterality: N/A;   COLONOSCOPY WITH PROPOFOL N/A 07/02/2018   Procedure: COLONOSCOPY WITH PROPOFOL;  Surgeon: Lollie Sails, MD;  Location: [facility] ENDOSCOPY;  Service: Endoscopy;  Laterality: N/A;   ESOPHAGOGASTRODUODENOSCOPY (EGD) WITH PROPOFOL N/A 01/18/2016   Procedure: ESOPHAGOGASTRODUODENOSCOPY (EGD) WITH PROPOFOL;  Surgeon: Lollie Sails, MD;  Location: [facility] ENDOSCOPY;  Service: Endoscopy;  Laterality: N/A;   FRACTURE SURGERY Right 1984   ankle   HERNIA REPAIR     UMBILICAL   LEG SURGERY Left    NASAL SEPTOPLASTY W/ TURBINOPLASTY Bilateral 11/06/2019   Procedure: NASAL SEPTOPLASTY WITH INFERIOR TURBINATE REDUCTION;  Surgeon: Margaretha Sheffield, MD;  Location: [facility] ORS;  Service: ENT;   Laterality: Bilateral;   toenail removal Bilateral    Patient Active Problem List   Diagnosis Date Noted   Leg pain, right 08/26/2019   Chronic venous insufficiency 08/26/2019   PAD (peripheral artery disease) ([place]) 08/14/2019   Obstructive sleep apnea 05/06/2019   Obesity, Class II, BMI 35-39.9 03/11/2019   Type 2 diabetes mellitus with stage 3a chronic kidney disease, without long-term current use of insulin ([place]) 03/11/2019   Pain in joint involving ankle and foot 11/12/2018   Nuclear sclerotic cataract of both eyes 08/30/2018   Abnormal sensation in left ear 08/20/2018   Dizziness 08/20/2018   Numbness and tingling of left side of face 08/20/2018   Essential hypertension with goal blood pressure less than 130/80 03/19/2018   Healthcare maintenance 08/07/2017   Brown recluse spider bite 05/08/2017   SCC (squamous cell carcinoma), 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/2015   Severe obesity (BMI 35.0-35.9 with comorbidity) ([place]) 06/01/2015   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   From initial evaluation  SUBJECTIVE:                                                                                                                                                                                         Chief Complaint: R shoulder pain  Pertinent History Pt referred to PT for R shoulder pain by orthopedics. His pain began  after a fall at the end of March 2023. He was walking down steps and started leaning forward. He responded by leaning back and he fell backwards bracing himself with his arms. He experienced immediate R shoulder pain. He denies any additional falls in the last 6 months but he did have a fall about 1 year ago. He had an evaluation of his R shoulder pain at his PCP office. Plain films of his thoracic spine and ribs were obtained which did not show any rib fractures but multilevel disc changes in his thoracic spine. He reports that prior to his fall he was receiving physical therapy for his cervical stenosis with notable improvement in his pain and mobility. He was last evaluated by physiatry on 08/26/2021 for his neck pain that radiates into his right arm. This was suspected to be due to his nerve root impingement seen on recent cervical spine MRI. There is also question of carpal tunnel syndrome. He was recommended EMG of the bilateral upper extremities and epidural steroid injection to his cervical spine. Pt states that [facility] PT told him there were "unable to see him" for his R shoulder pain. Since his fall he has had more pain along his posterior right shoulder. The pain is "under my shoulder blade" and it radiates around his right trunk. He points to his lateral R ribs, scapula, and periscapular area mostly.  He denies associated swelling, shoulder locking/catching, shoulder instability, weakness, fevers or chills, night sweats, weight loss, skin color change. He has tried acetaminophen, [place] dream cream, activity modification, chronic medication nortriptyline/pregabalin/tramadol with continued symptoms. He is right hand dominant and is retired from being a Art gallery manager. He also was previously in the [employer]   Pain:  Pain Intensity: Present: 0/10, Best: 0/10, Worst: 7/10 Pain location: R thoracic paraspinal/periscapular region Pain Quality: dull  Radiating: Yes, around the R ribs   Numbness/Tingling: No, He reports questionable minor numbness around R ribs Focal Weakness: No, not related to current R shoulder pain Aggravating factors: leaning to the left, standing forward from a chair, bending forward "makes me feel drunk," reaching out  with RUE; Relieving factors: Tylenol, topical analgesic provides temporary relief, no help with ice/heat,  24-hour pain behavior: "All day long" History of prior shoulder or neck/shoulder injury, pain, surgery, or therapy: Yes, see history Falls: Has patient fallen in last 6 months? Yes, Number of falls: 1 Dominant hand: right Imaging: Yes, Right Shoulder Radiographs - 3 views (Grashey, Axillary, Scapular Y) performed 08/31/2021: Type I acromion. Mild AC joint degenerative change with osteophyte formation. The distal clavicle, scapula, and proximal humerus are intact. Glenohumeral and acromioclavicular joint alignment appears normal. No fractures or dislocations. Well maintained glenohumeral joint space without evidence of degenerative joint disease. No soft tissue swelling or joint effusion. No destructive bony lesion is identified.  Cervical spine MRI without contrast performed 04/15/2021 IMPRESSION: Cervical spondylosis, as outlined and with findings most notably as follows: At C5-C6, a moderate-sized central disc protrusion contributes to multifactorial severe spinal canal stenosis with moderate spinal cord flattening. No definite spinal cord signal abnormality is identified, although motion degradation limits evaluation. Multifactorial bilateral neural foraminal narrowing (moderate right, severe left). No more than mild relative spinal canal narrowing at the remaining levels. Of note, a right center disc protrusion contacts the ventral spinal cord at C6-C7. Additional sites of foraminal stenosis, as detailed and greatest on the left at C3-C4 (moderate) and on the right at C6-C7 (moderate).    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  Recent memory is intact.  Remote memory is intact.  Attention span and concentration are intact.  Expressive speech is intact.  Patient's fund of knowledge is within normal limits for educational level. Conversation is tangential with frequent diversions     Gross Musculoskeletal Assessment Tremor: None Bulk: Normal Tone: Normal Overweight  Gait Full gait assessment deferred  Posture Forward head/rounded shoulders in sitting and standing   Cervical Screen Spurlings A (ipsilateral lateral flexion/axial compression): R: Positive for reproduction of periscapular pain L: Positive Spurlings B (ipsilateral lateral flexion/contralateral rotation/axial compression): R: Positive for reproduction of periscapular pain L: Positive Repeated movement: Not performed Hoffman Sign (cervical cord compression): R: Negative L: Negative ULTT Median: R: Not examined L: Not examined ULTT Ulnar: R: Not examined L: Not examined ULTT Radial: R: Not examined L: Not examined   AROM  AROM (Normal range in degrees) AROM 09/30/2021  Cervical  Flexion (50) 43  Extension (80) 23*  Right lateral flexion (45) 35*  Left lateral flexion (45) 35  Right rotation (85)   Left rotation (85)    Right Left  Shoulder    Flexion    Extension    Abduction    External Rotation    Internal Rotation    Hands Behind Head    Hands Behind Back        Elbow    Flexion     Extension    Pronation    Supination    (* = pain; Blank rows = not tested)    LE MMT:  MMT (out of 5) Right 09/30/2021 Left 09/30/2021  Cervical (isometric)  Flexion Weak  Extension Weak  Lateral Flexion Weak Weak  Rotation Weak Weak      Shoulder   Flexion 4 4  Extension    Abduction 4+ 4  External rotation 3+* 3+  Internal rotation 5 5  Horizontal abduction    Horizontal adduction    Lower Trapezius    Rhomboids        Elbow  Flexion 5 5  Extension 5 5  Pronation    Supination        Wrist  Flexion 5 5  Extension 5 5  Radial deviation    Ulnar deviation        MCP  Flexion 5 5  Extension 5 5  Abduction    Adduction    (* = pain; Blank rows = not tested)  Positive reproduction of concordant pain with resisted L shoulder ER  Sensation Deferred  Reflexes Deferred   Palpation  Location LEFT  RIGHT           Subocciptials  0  Cervical paraspinals  2  Upper Trapezius    Levator Scapulae    Rhomboid Major/Minor  2  Sternoclavicular joint    Acromioclavicular joint    Coracoid process    Long head of biceps  2  Supraspinatus    Infraspinatus    Subscapularis    Teres Minor    Teres Major    Pectoralis Major    Pectoralis Minor    Anterior Deltoid  1  Lateral Deltoid    Posterior Deltoid    Latissimus Dorsi    Sternocleidomastoid    (Blank rows = not tested) Graded on 0-4 scale (0 = no pain, 1 = pain, 2 = pain with wincing/grimacing/flinching, 3 = pain with withdrawal, 4 = unwilling to allow palpation), (Blank rows = not tested)   Repeated Movements Deferred  Passive Accessory Intervertebral Motion Deferred   Accessory Motions/Glides Deferred   SPECIAL TESTS Rotator Cuff  Drop Arm Test: Negative Painful Arc (Pain from 60 to 120 degrees scaption): Negative Infraspinatus Muscle Test: Positive   Subacromial Impingement Deferred  Labral Tear Deferred  Bicep Tendon Pathology Deferred  Shoulder  Instability Deferred   Beighton scale: Deferred  FROM 09/23/21 VISIT:  AROM  AROM (Normal range in degrees) AROM 09/23/2021  Cervical  Flexion (50) 43  Extension (80) 23*  Right lateral flexion (45) 35*  Left lateral flexion (45) 35  Right rotation (85)   Left rotation (85)    Right Left  Shoulder    Flexion 160* 160*  Extension    Abduction 145* 120*  External Rotation 60* 55*  Internal Rotation 86 85  Hands Behind Head    Hands Behind Back        Elbow    Flexion WNL   Extension WNL   Pronation    Supination    (* = pain; Blank rows = not tested)   Passive Accessory Intervertebral Motion Pt denies reproduction of shoulder pain with CPA C2-T7 and R UPA C2-T7 however he is tender centrally to pressure. Generally, hypomobile throughout however difficult to assess mobility secondary to pain;  Accessory Motions/Glides Glenohumeral: Posterior: R: Painful, unable to fully assess L: not examined Inferior: R: abnormal, hypomobile and painful L: not examined Anterior: R: normal L: not examined  Acromioclavicular:  Posterior: R: Too painful to assess fully  L: not examined Anterior: R: Too painful to assess fully L: not examined  Sternoclavicular: Posterior: R: Too painful to assess  fully L: not examined Anterior: R: Too painful to assess fully L: not examined Superior: R: Too painful to assess fully L: not examined Inferior: R: Too painful to assess fully L: not examined  Scapulothoracic: Painful in all directions so unable to fully assess mobility;   Muscle Length Testing Deferred   SPECIAL TESTS (all on right side)  Subacromial Impingement Hawkins-Kennedy: Positive Neer (Block scapula, PROM flexion): Positive Painful Arc (Pain from 60 to 120 degrees scaption): Negative Empty Can: Positive External Rotation Resistance: Positive Horizontal Adduction: Negative Scapular Assist: Not examined  Labral Tear Biceps Load II (120 elevation, full ER, 90 elbow  flexion, full supination, resisted elbow flexion): Negative Crank (160 scaption, axial load with IR/ER): Positive Active Compression Test: Negative  Bicep Tendon Pathology Speed (shoulder flexion to 90, external rotation, full elbow extension, and forearm supination with resistance: Negative Yergason's (resisted shoulder ER and supination/biceps tendon pathology): Not examined  Shoulder Instability Sulcus Sign: Positive Anterior Apprehension: Negative    TODAY'S TREATMENT   SUBJECTIVE: Pt reports that he has had a lot of vertigo this morning. He had an episode in the parking lot today on the way into the clinic and states that he almost fell. No resting R shoulder pain upon arrival. No specific questions currently.  PAIN: Denies at rest  Neuromuscular Re-education  Performed R Dix-Hallpike (on inverted mat table) which is negative for both vertigo and nystagmus. While returning to sitting pt starts to have vigorous L pure horizontal nystagmus with severe vertigo. Stopped table around 30 degrees of elevation with pt in supine on pillow and performed Roll Test to the left. Nystagmus starts to slow and eventually converts to pure horizontal apogeotrophic (R beating) nystagmus with severe vertigo. Nystagmus and vertigo persists for greater than 1 minute until head returned to midline and performed quick Roll Test to the right. Nystagmus converts to pure apogeotrophic (L beating) in this position with severe vertigo which lasts longer than >1 minutes. Speed of nystagmus and severity of vertigo are less on the right side. Findings are consistent with R horizontal cupulolithiasis BPPV.   Canalith Repositioning Treatment Utilized Gufoni Maneuver toward the R with 30s hold in first position and 2 minute hold in nose down position. Pt brought back to upright and repeated Roll Test. Nystagmus converts to pure horizontal geotrophic bilaterally (worse on the right) with concurrent vertigo. This is  consistent with conversion from R horizontal cupulolithiasis to R horizontal canalithiasis. When sitting up pt experiences severe nausea and proceeds to vomit into waste basket. Once nausea subsides pt treated with L Gufoni maneuver twice for R horizontal canalithiasis with 30s hold in position 1 and 2 minute hold in nose down position. Afterward repeated Roll Test and pt has only a very faint geotrophic nystagmus bilaterally with very mild vertigo. Pt escorted to waiting room by therapist due to imbalance and waits for approximately 45 minutes with regular checks from PT prior to leaving due to imbalance.    Not performed today: UBE x 5 minutes for arm-up during interval history (2.5 min forward/2.5 min backward); Supine R shoulder flexion with 2# dumbbell (DB) 2 x 10; Supine R shoulder circles (CW and CCW) with 2# DB 2 x 10; Supine R shoulder serratus punch with 2# DB 2 x 10; L sidelying R shoulder abduction with 2# DB 2 x 10; L sidelying R shoulder ER with 2# DB 2 x 10; Seated rows with red tband 2 x 10; Seated R shoulder extension with red tband 2 x  10; Seated "W's" with red tband 2 x 10; Seated scapular retractions 2 x 10; Seated Nautilus lat pull down with 40# x 10, 50# x 10;   PATIENT EDUCATION:  Education details: Pt educated throughout session about proper posture and technique with exercises. Improved exercise technique, movement at target joints, use of target muscles after min to mod verbal, visual, tactile cues. Education about different possible causes for patient's dizziness. Person educated: Patient Education method: Explanation, verbal cues, tactile cues, and handout Education comprehension: verbalized understanding,    HOME EXERCISE PROGRAM: Access Code: [email or web address] URL: [email or web address] Date: 09/23/2021 Prepared by: Roxana Hires  Exercises - Seated Scapular Retraction  - 1 x daily - 7 x weekly - 2 sets - 10 reps - 2s hold - Standing Shoulder Row  with Anchored Resistance  - 1 x daily - 7 x weekly - 2 sets - 10 reps - 2s hold - Shoulder W - External Rotation with Resistance  - 1 x daily - 7 x weekly - 2 sets - 10 reps - 2s hold   ASSESSMENT:  CLINICAL IMPRESSION: Pt reports continued continued bouts of vertigo so repeated BPPV testing today. Performed R Dix-Hallpike (on inverted mat table) which is negative for both vertigo and nystagmus. While returning to sitting pt starts to have vigorous L pure horizontal nystagmus with severe vertigo. Stopped table around 30 degrees of elevation with pt in supine on pillow and performed Roll Test to the left. Nystagmus starts to slow and eventually converts to pure horizontal apogeotrophic (R beating) nystagmus with severe vertigo. Nystagmus and vertigo persists for greater than 1 minute until head returned to midline and performed quick Roll Test to the right. Nystagmus converts to pure apogeotrophic (L beating) in this position with severe vertigo which lasts longer than >1 minutes. Speed of nystagmus and severity of vertigo are less on the right side. Findings are consistent with R horizontal cupulolithiasis BPPV.  Utilized Gufoni Maneuver toward the R with 30s hold in first position and 2 minute hold in nose down position. Pt brought back to upright and repeated Roll Test. Nystagmus converts to pure horizontal geotrophic bilaterally (worse on the right) with concurrent vertigo. This is consistent with conversion from R horizontal cupulolithiasis to R horizontal canalithiasis. When sitting up pt experiences severe nausea and proceeds to vomit into waste basket. Once nausea subsides pt treated with L Gufoni maneuver twice for R horizontal canalithiasis with 30s hold in position 1 and 2 minute hold in nose down position. Afterward repeated Roll Test and pt has only a very faint geotrophic nystagmus bilaterally with very mild vertigo. Pt escorted to waiting room by therapist due to imbalance and waits for  approximately 45 minutes with regular checks from PT prior to leaving due to imbalance.  Pt encouraged to continue HEP and follow-up as scheduled. Pt will benefit from PT services to address deficits in strength, pain, and range of motion in order to return to full function at home.    REHAB POTENTIAL: Fair secondary to multiple comorbidities and ongoing chronic neck pain  CLINICAL DECISION MAKING: Unstable/unpredictable  EVALUATION COMPLEXITY: High   GOALS: Goals reviewed with patient? Yes  SHORT TERM GOALS: Target date: 10/14/2021   Pt will be independent with HEP to improve strength and decrease shoulder pain to improve pain-free function at home and work. Baseline:  Goal status: INITIAL   LONG TERM GOALS: Target date: 11/10/2021   Pt will increase FOTO to at least 62 to demonstrate significant improvement in function  at home and work related to neck pain  Baseline: 09/15/21: 50 Goal status: INITIAL  2.  Pt will decrease worst shoulder pain by at least 3 points on the NPRS in order to demonstrate clinically significant reduction in shoulder pain. Baseline: 09/15/21: worst: 7/10; Goal status: INITIAL  3.  Pt will decrease quick DASH score by at least 8% in order to demonstrate clinically significant reduction in disability related to shoulder pain        Baseline: 09/15/21: 37.5% Goal status: INITIAL  4. Pt will increase strength of pain-free R shoulder external rotation to at least 4/5 in order to demonstrate improvement in strength and function of RUE       Baseline: 09/15/21: 3+/5 painful Goal status: INITIAL   PLAN: PT FREQUENCY: 1-2x/week  PT DURATION: 8 weeks  PLANNED INTERVENTIONS: Therapeutic exercises, Therapeutic activity, Neuromuscular re-education, Balance training, Gait training, Patient/Family education, Joint manipulation, Joint mobilization, Vestibular training, Canalith repositioning, Aquatic Therapy, Dry Needling, Electrical stimulation, Spinal manipulation,  Spinal mobilization, Cryotherapy, Moist heat, Traction, Ultrasound, Ionotophoresis '4mg'$ /ml Dexamethasone, and Manual therapy  PLAN FOR NEXT SESSION: Re-test for BPPV and treat as indicated, continue manual techniques and strengthening (primary focus), review and modify HEP as needed  Lyndel Safe Felesia Stahlecker PT, DPT, GCS  Tilford Deaton 09/30/2021, 10:45 AM

## 2021-10-05 ENCOUNTER — Ambulatory Visit: Payer: Medicare Other | Admitting: Physical Therapy

## 2021-10-05 DIAGNOSIS — M25511 Pain in right shoulder: Secondary | ICD-10-CM

## 2021-10-05 DIAGNOSIS — R42 Dizziness and giddiness: Secondary | ICD-10-CM

## 2021-10-05 NOTE — Therapy (Signed)
OUTPATIENT PHYSICAL THERAPY SHOULDER TREATMENT   Patient Name: Bobby Chen MRN: [id number] DOB:27-Apr-1945, 76 y.o., male Today's Date: 10/05/2021   PT End of Session - 10/05/21 1335     Visit Number 5    Number of Visits 17    Date for PT Re-Evaluation 11/10/21    Authorization Type eval: 09/15/21    PT Start Time 0928    PT Stop Time 1013    PT Time Calculation (min) 45 min    Activity Tolerance Patient tolerated treatment well    Behavior During Therapy [facility] for tasks assessed/performed               Past Medical History:  Diagnosis Date   Anginal pain ([place])    Brown recluse spider bite    Cancer ([place])    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, LEFT ARM    COPD (chronic obstructive pulmonary disease) ([place])    Diabetes mellitus without complication ([place])    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[place])    Elevated lipids    Hypertension    Obesity    Pulmonary arterial hypertension ([place])    Sleep apnea    Past Surgical History:  Procedure Laterality Date   ANKLE FRACTURE SURGERY     BASAL CELL CARCINOMA EXCISION N/A    scalp   COLONOSCOPY W/ POLYPECTOMY     COLONOSCOPY WITH PROPOFOL N/A 01/18/2016   Procedure: COLONOSCOPY WITH PROPOFOL;  Surgeon: Lollie Sails, MD;  Location: [facility] ENDOSCOPY;  Service: Endoscopy;  Laterality: N/A;   COLONOSCOPY WITH PROPOFOL N/A 07/02/2018   Procedure: COLONOSCOPY WITH PROPOFOL;  Surgeon: Lollie Sails, MD;  Location: [facility] ENDOSCOPY;  Service: Endoscopy;  Laterality: N/A;   ESOPHAGOGASTRODUODENOSCOPY (EGD) WITH PROPOFOL N/A 01/18/2016   Procedure: ESOPHAGOGASTRODUODENOSCOPY (EGD) WITH PROPOFOL;  Surgeon: Lollie Sails, MD;  Location: [facility] ENDOSCOPY;  Service: Endoscopy;  Laterality: N/A;   FRACTURE SURGERY Right 1984   ankle   HERNIA REPAIR     UMBILICAL   LEG SURGERY Left    NASAL SEPTOPLASTY W/ TURBINOPLASTY Bilateral 11/06/2019   Procedure: NASAL SEPTOPLASTY WITH INFERIOR TURBINATE REDUCTION;  Surgeon: Margaretha Sheffield, MD;  Location: [facility] ORS;  Service:  ENT;  Laterality: Bilateral;   toenail removal Bilateral    Patient Active Problem List   Diagnosis Date Noted   Leg pain, right 08/26/2019   Chronic venous insufficiency 08/26/2019   PAD (peripheral artery disease) ([place]) 08/14/2019   Obstructive sleep apnea 05/06/2019   Obesity, Class II, BMI 35-39.9 03/11/2019   Type 2 diabetes mellitus with stage 3a chronic kidney disease, without long-term current use of insulin ([place]) 03/11/2019   Pain in joint involving ankle and foot 11/12/2018   Nuclear sclerotic cataract of both eyes 08/30/2018   Abnormal sensation in left ear 08/20/2018   Dizziness 08/20/2018   Numbness and tingling of left side of face 08/20/2018   Essential hypertension with goal blood pressure less than 130/80 03/19/2018   Healthcare maintenance 08/07/2017   Brown recluse spider bite 05/08/2017   SCC (squamous cell carcinoma), 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/2015   Severe obesity (BMI 35.0-35.9 with comorbidity) (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[place]) 09/29/2013   Hyperlipidemia associated with type 2 diabetes mellitus ([place]) 09/29/2013    PCP: Kirk Ruths, MD  REFERRING PROVIDER: Diamond Nickel, DO  REFERRING DIAGNOSIS: M25.511 (ICD-10-CM) - Pain in right shoulder W19.XXXA (ICD-10-CM) - Unspecified fall, initial encounter M62.838 (ICD-10-CM) - Other muscle spasm M75.41 (ICD-10-CM) - Impingement syndrome of right shoulder   THERAPY DIAG: Dizziness and giddiness  Right shoulder pain, unspecified chronicity  RATIONALE FOR EVALUATION AND TREATMENT: Rehabilitation  ONSET DATE: 06/23/21 (approximate)  FOLLOW UP APPT WITH PROVIDER: Yes, returns in June 2023 to see [name]   From initial evaluation  SUBJECTIVE:                                                                                                                                                                                         Chief Complaint: R shoulder pain  Pertinent History Pt referred to PT  for R shoulder pain by orthopedics. His pain began after a fall at the end of March 2023. He was walking down steps and started leaning forward. He responded by leaning back and he fell backwards bracing himself with his arms. He experienced immediate R shoulder pain. He denies any additional falls in the last 6 months but he did have a fall about 1 year ago. He had an evaluation of his R shoulder pain at his PCP office. Plain films of his thoracic spine and ribs were obtained which did not show any rib fractures but multilevel disc changes in his thoracic spine. He reports that prior to his fall he was receiving physical therapy for his cervical stenosis with notable improvement in his pain and mobility. He was last evaluated by physiatry on 08/26/2021 for his neck pain that radiates into his right arm. This was suspected to be due to his nerve root impingement seen on recent cervical spine MRI. There is also question of carpal tunnel syndrome. He was recommended EMG of the bilateral upper extremities and epidural steroid injection to his cervical spine. Pt states that [facility] PT told him there were "unable to see him" for his R shoulder pain. Since his fall he has had more pain along his posterior right shoulder. The pain is "under my shoulder blade" and it radiates around his right trunk. He points to his lateral R ribs, scapula, and periscapular area mostly.  He denies associated swelling, shoulder locking/catching, shoulder instability, weakness, fevers or chills, night sweats, weight loss, skin color change. He has tried acetaminophen, [place] dream cream, activity modification, chronic medication nortriptyline/pregabalin/tramadol with continued symptoms. He is right hand dominant and is retired from being a Art gallery manager. He also was previously in the [employer]   Pain:  Pain Intensity: Present: 0/10, Best: 0/10, Worst: 7/10 Pain location: R thoracic paraspinal/periscapular region Pain Quality: dull   Radiating: Yes, around the R ribs  Numbness/Tingling: No, He reports questionable minor numbness around R ribs Focal Weakness: No, not related to current R shoulder pain Aggravating factors: leaning to the left, standing forward from a chair, bending forward "  makes me feel drunk," reaching out with RUE; Relieving factors: Tylenol, topical analgesic provides temporary relief, no help with ice/heat,  24-hour pain behavior: "All day long" History of prior shoulder or neck/shoulder injury, pain, surgery, or therapy: Yes, see history Falls: Has patient fallen in last 6 months? Yes, Number of falls: 1 Dominant hand: right Imaging: Yes, Right Shoulder Radiographs - 3 views (Grashey, Axillary, Scapular Y) performed 08/31/2021: Type I acromion. Mild AC joint degenerative change with osteophyte formation. The distal clavicle, scapula, and proximal humerus are intact. Glenohumeral and acromioclavicular joint alignment appears normal. No fractures or dislocations. Well maintained glenohumeral joint space without evidence of degenerative joint disease. No soft tissue swelling or joint effusion. No destructive bony lesion is identified.  Cervical spine MRI without contrast performed 04/15/2021 IMPRESSION: Cervical spondylosis, as outlined and with findings most notably as follows: At C5-C6, a moderate-sized central disc protrusion contributes to multifactorial severe spinal canal stenosis with moderate spinal cord flattening. No definite spinal cord signal abnormality is identified, although motion degradation limits evaluation. Multifactorial bilateral neural foraminal narrowing (moderate right, severe left). No more than mild relative spinal canal narrowing at the remaining levels. Of note, a right center disc protrusion contacts the ventral spinal cord at C6-C7. Additional sites of foraminal stenosis, as detailed and greatest on the left at C3-C4 (moderate) and on the right at C6-C7 (moderate).    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  Recent memory is intact.  Remote memory is intact.  Attention span and concentration are intact.  Expressive speech is intact.  Patient's fund of knowledge is within normal limits for educational level. Conversation is tangential with frequent diversions     Gross Musculoskeletal Assessment Tremor: None Bulk: Normal Tone: Normal Overweight  Gait Full gait assessment deferred  Posture Forward head/rounded shoulders in sitting and standing   Cervical Screen Spurlings A (ipsilateral lateral flexion/axial compression): R: Positive for reproduction of periscapular pain L: Positive Spurlings B (ipsilateral lateral flexion/contralateral rotation/axial compression): R: Positive for reproduction of periscapular pain L: Positive Repeated movement: Not performed Hoffman Sign (cervical cord compression): R: Negative L: Negative ULTT Median: R: Not examined L: Not examined ULTT Ulnar: R: Not examined L: Not examined ULTT Radial: R: Not examined L: Not examined   AROM  AROM (Normal range in degrees) AROM 10/05/2021  Cervical  Flexion (50) 43  Extension (80) 23*  Right lateral flexion (45) 35*  Left lateral flexion (45) 35  Right rotation (85)   Left rotation (85)    Right Left  Shoulder    Flexion    Extension    Abduction    External Rotation    Internal Rotation    Hands Behind Head    Hands Behind Back         Elbow    Flexion    Extension    Pronation    Supination    (* = pain; Blank rows = not tested)    LE MMT:  MMT (out of  5) Right 10/05/2021 Left 10/05/2021  Cervical (isometric)  Flexion Weak  Extension Weak  Lateral Flexion Weak Weak  Rotation Weak Weak      Shoulder   Flexion 4 4  Extension    Abduction 4+ 4  External rotation 3+* 3+  Internal rotation 5 5  Horizontal abduction    Horizontal adduction    Lower Trapezius    Rhomboids        Elbow  Flexion 5 5  Extension 5 5  Pronation    Supination        Wrist  Flexion 5 5  Extension 5 5  Radial deviation    Ulnar deviation        MCP  Flexion 5 5  Extension 5 5  Abduction    Adduction    (* = pain; Blank rows = not tested)  Positive reproduction of concordant pain with resisted L shoulder ER  Sensation Deferred  Reflexes Deferred   Palpation  Location LEFT  RIGHT           Subocciptials  0  Cervical paraspinals  2  Upper Trapezius    Levator Scapulae    Rhomboid Major/Minor  2  Sternoclavicular joint    Acromioclavicular joint    Coracoid process    Long head of biceps  2  Supraspinatus    Infraspinatus    Subscapularis    Teres Minor    Teres Major    Pectoralis Major    Pectoralis Minor    Anterior Deltoid  1  Lateral Deltoid    Posterior Deltoid    Latissimus Dorsi    Sternocleidomastoid    (Blank rows = not tested) Graded on 0-4 scale (0 = no pain, 1 = pain, 2 = pain with wincing/grimacing/flinching, 3 = pain with withdrawal, 4 = unwilling to allow palpation), (Blank rows = not tested)   Repeated Movements Deferred  Passive Accessory Intervertebral Motion Deferred   Accessory Motions/Glides Deferred   SPECIAL TESTS Rotator Cuff  Drop Arm Test: Negative Painful Arc (Pain from 60 to 120 degrees scaption): Negative Infraspinatus Muscle Test: Positive   Subacromial Impingement Deferred  Labral Tear Deferred  Bicep Tendon  Pathology Deferred  Shoulder Instability Deferred   Beighton scale: Deferred  FROM 09/23/21 VISIT:  AROM  AROM (Normal range in degrees) AROM 09/23/2021  Cervical  Flexion (50) 43  Extension (80) 23*  Right lateral flexion (45) 35*  Left lateral flexion (45) 35  Right rotation (85)   Left rotation (85)    Right Left  Shoulder    Flexion 160* 160*  Extension    Abduction 145* 120*  External Rotation 60* 55*  Internal Rotation 86 85  Hands Behind Head    Hands Behind Back        Elbow    Flexion WNL   Extension WNL   Pronation    Supination    (* = pain; Blank rows = not tested)   Passive Accessory Intervertebral Motion Pt denies reproduction of shoulder pain with CPA C2-T7 and R UPA C2-T7 however he is tender centrally to pressure. Generally, hypomobile throughout however difficult to assess mobility secondary to pain;  Accessory Motions/Glides Glenohumeral: Posterior: R: Painful, unable to fully assess L: not examined Inferior: R: abnormal, hypomobile and painful L: not examined Anterior: R: normal L: not examined  Acromioclavicular:  Posterior: R: Too painful to assess fully  L: not examined Anterior: R: Too painful to assess fully L: not examined  Sternoclavicular:  Posterior: R: Too painful to assess fully L: not examined Anterior: R: Too painful to assess fully L: not examined Superior: R: Too painful to assess fully L: not examined Inferior: R: Too painful to assess fully L: not examined  Scapulothoracic: Painful in all directions so unable to fully assess mobility;   Muscle Length Testing Deferred   SPECIAL TESTS (all on right side)  Subacromial Impingement Hawkins-Kennedy: Positive Neer (Block scapula, PROM flexion): Positive Painful Arc (Pain from 60 to 120 degrees scaption): Negative Empty Can: Positive External Rotation Resistance: Positive Horizontal Adduction: Negative Scapular Assist: Not examined  Labral Tear Biceps Load II (120  elevation, full ER, 90 elbow flexion, full supination, resisted elbow flexion): Negative Crank (160 scaption, axial load with IR/ER): Positive Active Compression Test: Negative  Bicep Tendon Pathology Speed (shoulder flexion to 90, external rotation, full elbow extension, and forearm supination with resistance: Negative Yergason's (resisted shoulder ER and supination/biceps tendon pathology): Not examined  Shoulder Instability Sulcus Sign: Positive Anterior Apprehension: Negative    TODAY'S TREATMENT   SUBJECTIVE: Pt reports last bout of vertigo was Saturday morning. After performing rows and W in HEP, pt rates 5/10 pain in right upper back. Denies resting R shoulder pain upon arrival. No specific questions currently.  PAIN: 5/10 upper back   Manual Therapy STM and IASTM utilizing green TB roller to right periscapular area including rhomboid major and superior lats; techniques include graded pressure and trigger point release.   Therapeutic Exercise UBE x 5 minutes for arm-up during interval history (2.5 min forward/2.5 min backward); Supine R shoulder flexion with 2# DB 2 x 10; Supine R shoulder circles (CW and CCW) with 2# DB 2 x 10; Supine R shoulder serratus punch with 2# DB 2 x 10; L sidelying R shoulder abduction with 2# DB 2 x 10; L sidelying R shoulder ER with 2# DB 2 x 10; Seated scapular retractions 2 x 10; Seated rows with yellow tband 2 x 10; Seated R shoulder extension with yellow tband 2 x 10;   Not today: Seated "W's" with red tband 2 x 10; Seated Nautilus lat pull down with 40# x 10, 50# x 10;   PATIENT EDUCATION:  Education details: Pt educated throughout session about proper posture and technique with exercises. Improved exercise technique, movement at target joints, use of target muscles after min to mod verbal, visual, tactile cues. Education about different possible causes for patient's dizziness. Person educated: Patient Education method:  Explanation, verbal cues, tactile cues, and handout Education comprehension: verbalized understanding,    HOME EXERCISE PROGRAM: Access Code: [email or web address] URL: [email or web address] Date: 09/23/2021 Prepared by: Roxana Hires  Exercises - Seated Scapular Retraction  - 1 x daily - 7 x weekly - 2 sets - 10 reps - 2s hold - Standing Shoulder Row with Anchored Resistance  - 1 x daily - 7 x weekly - 2 sets - 10 reps - 2s hold - Shoulder W - External Rotation with Resistance  - 1 x daily - 7 x weekly - 2 sets - 10 reps - 2s hold   ASSESSMENT:  CLINICAL IMPRESSION: Pt demonstrated good effort and motivation within session. Continued with Julian strengthening, pt tolerated well with no increase in pain. He became limited by pain with periscapular strengthening. PT identified 2 trigger points with reproducible symptoms in rhomboid major and superior lats, both right side. Mild change in tissue quality following STM; no change in subjective pt report. Did perform light periscapular strengthening, PT frequently reminding pt to move  gently, slowly and deliberately as not to aggravate pain. Pt did experience one bout of vertigo after sitting up to the side of the mat table; remained seated with PT guarding for ~2 minutes until dizziness cleared. Pt encouraged to continue HEP and follow-up as scheduled. Pt will benefit from PT services to address deficits in strength, pain, and range of motion in order to return to full function at home.     REHAB POTENTIAL: Fair secondary to multiple comorbidities and ongoing chronic neck pain  CLINICAL DECISION MAKING: Unstable/unpredictable  EVALUATION COMPLEXITY: High   GOALS: Goals reviewed with patient? Yes  SHORT TERM GOALS: Target date: 10/14/2021   Pt will be independent with HEP to improve strength and decrease shoulder pain to improve pain-free function at home and work. Baseline:  Goal status: INITIAL   LONG TERM GOALS: Target date:  11/10/2021   Pt will increase FOTO to at least 62 to demonstrate significant improvement in function at home and work related to neck pain  Baseline: 09/15/21: 50 Goal status: INITIAL  2.  Pt will decrease worst shoulder pain by at least 3 points on the NPRS in order to demonstrate clinically significant reduction in shoulder pain. Baseline: 09/15/21: worst: 7/10; Goal status: INITIAL  3.  Pt will decrease quick DASH score by at least 8% in order to demonstrate clinically significant reduction in disability related to shoulder pain        Baseline: 09/15/21: 37.5% Goal status: INITIAL  4. Pt will increase strength of pain-free R shoulder external rotation to at least 4/5 in order to demonstrate improvement in strength and function of RUE       Baseline: 09/15/21: 3+/5 painful Goal status: INITIAL   PLAN: PT FREQUENCY: 1-2x/week  PT DURATION: 8 weeks  PLANNED INTERVENTIONS: Therapeutic exercises, Therapeutic activity, Neuromuscular re-education, Balance training, Gait training, Patient/Family education, Joint manipulation, Joint mobilization, Vestibular training, Canalith repositioning, Aquatic Therapy, Dry Needling, Electrical stimulation, Spinal manipulation, Spinal mobilization, Cryotherapy, Moist heat, Traction, Ultrasound, Ionotophoresis '4mg'$ /ml Dexamethasone, and Manual therapy  PLAN FOR NEXT SESSION: Re-test for BPPV and treat as indicated, continue manual techniques and strengthening (primary focus), review and modify HEP as needed   Patrina Levering PT, DPT

## 2021-10-07 ENCOUNTER — Ambulatory Visit: Payer: Medicare Other | Admitting: Physical Therapy

## 2021-10-07 DIAGNOSIS — M25511 Pain in right shoulder: Secondary | ICD-10-CM

## 2021-10-07 DIAGNOSIS — R42 Dizziness and giddiness: Secondary | ICD-10-CM

## 2021-10-07 NOTE — Therapy (Signed)
OUTPATIENT PHYSICAL THERAPY SHOULDER TREATMENT   Patient Name: Bobby Chen MRN: [id number] DOB:05/16/1945, 76 y.o., male Today's Date: 10/07/2021   PT End of Session - 10/07/21 1210     Visit Number 6    Number of Visits 17    Date for PT Re-Evaluation 11/10/21    Authorization Type eval: 09/15/21    PT Start Time 0930    PT Stop Time 1016    PT Time Calculation (min) 46 min    Activity Tolerance Patient tolerated treatment well    Behavior During Therapy [facility] for tasks assessed/performed                Past Medical History:  Diagnosis Date   Anginal pain ([place])    Brown recluse spider bite    Cancer ([place])    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, LEFT ARM    COPD (chronic obstructive pulmonary disease) ([place])    Diabetes mellitus without complication ([place])    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[place])    Elevated lipids    Hypertension    Obesity    Pulmonary arterial hypertension ([place])    Sleep apnea    Past Surgical History:  Procedure Laterality Date   ANKLE FRACTURE SURGERY     BASAL CELL CARCINOMA EXCISION N/A    scalp   COLONOSCOPY W/ POLYPECTOMY     COLONOSCOPY WITH PROPOFOL N/A 01/18/2016   Procedure: COLONOSCOPY WITH PROPOFOL;  Surgeon: Lollie Sails, MD;  Location: [facility] ENDOSCOPY;  Service: Endoscopy;  Laterality: N/A;   COLONOSCOPY WITH PROPOFOL N/A 07/02/2018   Procedure: COLONOSCOPY WITH PROPOFOL;  Surgeon: Lollie Sails, MD;  Location: [facility] ENDOSCOPY;  Service: Endoscopy;  Laterality: N/A;   ESOPHAGOGASTRODUODENOSCOPY (EGD) WITH PROPOFOL N/A 01/18/2016   Procedure: ESOPHAGOGASTRODUODENOSCOPY (EGD) WITH PROPOFOL;  Surgeon: Lollie Sails, MD;  Location: [facility] ENDOSCOPY;  Service: Endoscopy;  Laterality: N/A;   FRACTURE SURGERY Right 1984   ankle   HERNIA REPAIR     UMBILICAL   LEG SURGERY Left    NASAL SEPTOPLASTY W/ TURBINOPLASTY Bilateral 11/06/2019   Procedure: NASAL SEPTOPLASTY WITH INFERIOR TURBINATE REDUCTION;  Surgeon: Margaretha Sheffield, MD;  Location: [facility] ORS;  Service:  ENT;  Laterality: Bilateral;   toenail removal Bilateral    Patient Active Problem List   Diagnosis Date Noted   Leg pain, right 08/26/2019   Chronic venous insufficiency 08/26/2019   PAD (peripheral artery disease) ([place]) 08/14/2019   Obstructive sleep apnea 05/06/2019   Obesity, Class II, BMI 35-39.9 03/11/2019   Type 2 diabetes mellitus with stage 3a chronic kidney disease, without long-term current use of insulin ([place]) 03/11/2019   Pain in joint involving ankle and foot 11/12/2018   Nuclear sclerotic cataract of both eyes 08/30/2018   Abnormal sensation in left ear 08/20/2018   Dizziness 08/20/2018   Numbness and tingling of left side of face 08/20/2018   Essential hypertension with goal blood pressure less than 130/80 03/19/2018   Healthcare maintenance 08/07/2017   Brown recluse spider bite 05/08/2017   SCC (squamous cell carcinoma), 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/2015   Severe obesity (BMI 35.0-35.9 with comorbidity) ([place]) 06/01/2015   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[place]) 09/29/2013   Hyperlipidemia associated with type 2 diabetes mellitus ([place]) 09/29/2013    PCP: Kirk Ruths, MD  REFERRING PROVIDER: Kirk Ruths, MD  REFERRING DIAGNOSIS: M25.511 (ICD-10-CM) - Pain in right shoulder W19.XXXA (ICD-10-CM) - Unspecified fall, initial encounter M62.838 (ICD-10-CM) - Other muscle spasm M75.41 (ICD-10-CM) - Impingement syndrome of right shoulder   THERAPY DIAG: Dizziness and  giddiness  Right shoulder pain, unspecified chronicity  RATIONALE FOR EVALUATION AND TREATMENT: Rehabilitation  ONSET DATE: 06/23/21 (approximate)  FOLLOW UP APPT WITH PROVIDER: Yes, returns in June 2023 to see [name]   From initial evaluation  SUBJECTIVE:                                                                                                                                                                                         Chief Complaint: R shoulder pain  Pertinent History Pt referred to  PT for R shoulder pain by orthopedics. His pain began after a fall at the end of March 2023. He was walking down steps and started leaning forward. He responded by leaning back and he fell backwards bracing himself with his arms. He experienced immediate R shoulder pain. He denies any additional falls in the last 6 months but he did have a fall about 1 year ago. He had an evaluation of his R shoulder pain at his PCP office. Plain films of his thoracic spine and ribs were obtained which did not show any rib fractures but multilevel disc changes in his thoracic spine. He reports that prior to his fall he was receiving physical therapy for his cervical stenosis with notable improvement in his pain and mobility. He was last evaluated by physiatry on 08/26/2021 for his neck pain that radiates into his right arm. This was suspected to be due to his nerve root impingement seen on recent cervical spine MRI. There is also question of carpal tunnel syndrome. He was recommended EMG of the bilateral upper extremities and epidural steroid injection to his cervical spine. Pt states that [facility] PT told him there were "unable to see him" for his R shoulder pain. Since his fall he has had more pain along his posterior right shoulder. The pain is "under my shoulder blade" and it radiates around his right trunk. He points to his lateral R ribs, scapula, and periscapular area mostly.  He denies associated swelling, shoulder locking/catching, shoulder instability, weakness, fevers or chills, night sweats, weight loss, skin color change. He has tried acetaminophen, [place] dream cream, activity modification, chronic medication nortriptyline/pregabalin/tramadol with continued symptoms. He is right hand dominant and is retired from being a Art gallery manager. He also was previously in the [employer]   Pain:  Pain Intensity: Present: 0/10, Best: 0/10, Worst: 7/10 Pain location: R thoracic paraspinal/periscapular region Pain Quality: dull   Radiating: Yes, around the R ribs  Numbness/Tingling: No, He reports questionable minor numbness around R ribs Focal Weakness: No, not related to current R shoulder pain Aggravating factors: leaning to the left, standing forward from a chair,  bending forward "makes me feel drunk," reaching out with RUE; Relieving factors: Tylenol, topical analgesic provides temporary relief, no help with ice/heat,  24-hour pain behavior: "All day long" History of prior shoulder or neck/shoulder injury, pain, surgery, or therapy: Yes, see history Falls: Has patient fallen in last 6 months? Yes, Number of falls: 1 Dominant hand: right Imaging: Yes, Right Shoulder Radiographs - 3 views (Grashey, Axillary, Scapular Y) performed 08/31/2021: Type I acromion. Mild AC joint degenerative change with osteophyte formation. The distal clavicle, scapula, and proximal humerus are intact. Glenohumeral and acromioclavicular joint alignment appears normal. No fractures or dislocations. Well maintained glenohumeral joint space without evidence of degenerative joint disease. No soft tissue swelling or joint effusion. No destructive bony lesion is identified.  Cervical spine MRI without contrast performed 04/15/2021 IMPRESSION: Cervical spondylosis, as outlined and with findings most notably as follows: At C5-C6, a moderate-sized central disc protrusion contributes to multifactorial severe spinal canal stenosis with moderate spinal cord flattening. No definite spinal cord signal abnormality is identified, although motion degradation limits evaluation. Multifactorial bilateral neural foraminal narrowing (moderate right, severe left). No more than mild relative spinal canal narrowing at the remaining levels. Of note, a right center disc protrusion contacts the ventral spinal cord at C6-C7. Additional sites of foraminal stenosis, as detailed and greatest on the left at C3-C4 (moderate) and on the right at C6-C7 (moderate).    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  Recent memory is intact.  Remote memory is intact.  Attention span and concentration are intact.  Expressive speech is intact.  Patient's fund of knowledge is within normal limits for educational level. Conversation is tangential with frequent diversions     Gross Musculoskeletal Assessment Tremor: None Bulk: Normal Tone: Normal Overweight  Gait Full gait assessment deferred  Posture Forward head/rounded shoulders in sitting and standing   Cervical Screen Spurlings A (ipsilateral lateral flexion/axial compression): R: Positive for reproduction of periscapular pain L: Positive Spurlings B (ipsilateral lateral flexion/contralateral rotation/axial compression): R: Positive for reproduction of periscapular pain L: Positive Repeated movement: Not performed Hoffman Sign (cervical cord compression): R: Negative L: Negative ULTT Median: R: Not examined L: Not examined ULTT Ulnar: R: Not examined L: Not examined ULTT Radial: R: Not examined L: Not examined   AROM  AROM (Normal range in degrees) AROM 10/07/2021  Cervical  Flexion (50) 43  Extension (80) 23*  Right lateral flexion (45) 35*  Left lateral flexion (45) 35  Right rotation (85)   Left rotation (85)    Right Left  Shoulder    Flexion    Extension    Abduction    External Rotation    Internal Rotation    Hands Behind Head    Hands Behind Back         Elbow    Flexion    Extension    Pronation    Supination    (* = pain; Blank rows = not tested)    LE MMT:  MMT (  out of 5) Right 10/07/2021 Left 10/07/2021  Cervical (isometric)  Flexion Weak  Extension Weak  Lateral Flexion Weak Weak  Rotation Weak Weak      Shoulder   Flexion 4 4  Extension    Abduction 4+ 4  External rotation 3+* 3+  Internal rotation 5 5  Horizontal abduction    Horizontal adduction    Lower Trapezius    Rhomboids        Elbow  Flexion 5 5  Extension 5 5  Pronation    Supination        Wrist  Flexion 5 5  Extension 5 5  Radial deviation    Ulnar deviation        MCP  Flexion 5 5  Extension 5 5  Abduction    Adduction    (* = pain; Blank rows = not tested)  Positive reproduction of concordant pain with resisted L shoulder ER  Sensation Deferred  Reflexes Deferred   Palpation  Location LEFT  RIGHT           Subocciptials  0  Cervical paraspinals  2  Upper Trapezius    Levator Scapulae    Rhomboid Major/Minor  2  Sternoclavicular joint    Acromioclavicular joint    Coracoid process    Long head of biceps  2  Supraspinatus    Infraspinatus    Subscapularis    Teres Minor    Teres Major    Pectoralis Major    Pectoralis Minor    Anterior Deltoid  1  Lateral Deltoid    Posterior Deltoid    Latissimus Dorsi    Sternocleidomastoid    (Blank rows = not tested) Graded on 0-4 scale (0 = no pain, 1 = pain, 2 = pain with wincing/grimacing/flinching, 3 = pain with withdrawal, 4 = unwilling to allow palpation), (Blank rows = not tested)   Repeated Movements Deferred  Passive Accessory Intervertebral Motion Deferred   Accessory Motions/Glides Deferred   SPECIAL TESTS Rotator Cuff  Drop Arm Test: Negative Painful Arc (Pain from 60 to 120 degrees scaption): Negative Infraspinatus Muscle Test: Positive   Subacromial Impingement Deferred  Labral Tear Deferred  Bicep Tendon  Pathology Deferred  Shoulder Instability Deferred   Beighton scale: Deferred  FROM 09/23/21 VISIT:  AROM  AROM (Normal range in degrees) AROM 09/23/2021  Cervical  Flexion (50) 43  Extension (80) 23*  Right lateral flexion (45) 35*  Left lateral flexion (45) 35  Right rotation (85)   Left rotation (85)    Right Left  Shoulder    Flexion 160* 160*  Extension    Abduction 145* 120*  External Rotation 60* 55*  Internal Rotation 86 85  Hands Behind Head    Hands Behind Back        Elbow    Flexion WNL   Extension WNL   Pronation    Supination    (* = pain; Blank rows = not tested)   Passive Accessory Intervertebral Motion Pt denies reproduction of shoulder pain with CPA C2-T7 and R UPA C2-T7 however he is tender centrally to pressure. Generally, hypomobile throughout however difficult to assess mobility secondary to pain;  Accessory Motions/Glides Glenohumeral: Posterior: R: Painful, unable to fully assess L: not examined Inferior: R: abnormal, hypomobile and painful L: not examined Anterior: R: normal L: not examined  Acromioclavicular:  Posterior: R: Too painful to assess fully  L: not examined Anterior: R: Too painful to assess fully L: not examined  Sternoclavicular: Posterior: R: Too painful to assess fully L: not examined Anterior: R: Too painful to assess fully L: not examined Superior: R: Too painful to assess fully L: not examined Inferior: R: Too painful to assess fully L: not examined  Scapulothoracic: Painful in all directions so unable to fully assess mobility;   Muscle Length Testing Deferred   SPECIAL TESTS (all on right side)  Subacromial Impingement Hawkins-Kennedy: Positive Neer (Block scapula, PROM flexion): Positive Painful Arc (Pain from 60 to 120 degrees scaption): Negative Empty Can: Positive External Rotation Resistance: Positive Horizontal Adduction: Negative Scapular Assist: Not examined  Labral Tear Biceps Load II (120  elevation, full ER, 90 elbow flexion, full supination, resisted elbow flexion): Negative Crank (160 scaption, axial load with IR/ER): Positive Active Compression Test: Negative  Bicep Tendon Pathology Speed (shoulder flexion to 90, external rotation, full elbow extension, and forearm supination with resistance: Negative Yergason's (resisted shoulder ER and supination/biceps tendon pathology): Not examined  Shoulder Instability Sulcus Sign: Positive Anterior Apprehension: Negative    TODAY'S TREATMENT   SUBJECTIVE: Pt reports performing full HEP with rows and "W" continuing to cause pain. Reports a very brief bout of vertigo this morning as he was getting dressed; no fall or LOB. Denies resting R shoulder pain upon arrival. Does endorse intermittent pain in right upper back, max pain 5-6/10. Notes pain with overhead reaching and reaching across the body to the left. Has appointment with [name] today. No specific questions currently.  PAIN: 5/10 upper back   Manual Therapy STM to right anterior deltoid (supine) and periscapular area (sitting - pt prefers to avoid prone) including rhomboid major and superior lats; techniques include graded pressure and trigger point release. VC on diaphragmatic breathing to assist in relaxation.   Therapeutic Exercise UBE x 5 minutes for arm-up during interval history (2.5 min forward/2.5 min backward); Supine R shoulder flexion with 2# DB 2 x 10; Supine R shoulder circles (CW and CCW) with 2# DB 2 x 10; Supine R chest press with serratus punch with 2# DB 2 x 10; L sidelying R shoulder abduction with 2# DB 2 x 10; L sidelying R shoulder ER with 2# DB 2 x 10;    Not today: Seated scapular retractions 2 x 10; Seated rows with yellow tband 2 x 10; Seated R shoulder extension with yellow tband 2 x 10; Seated "W's" with red tband 2 x 10; Seated Nautilus lat pull down with 40# x 10, 50# x 10;   PATIENT EDUCATION:  Education details: Pt  educated throughout session about proper posture and technique with exercises. Improved exercise technique, movement at target joints, use of target muscles after min to mod verbal, visual, tactile cues. Education about different possible causes for patient's dizziness. Person educated: Patient Education method: Explanation, verbal cues, tactile cues, and handout Education comprehension: verbalized understanding,    HOME EXERCISE PROGRAM: Access Code: [email or web address] URL: [email or web address] Date: 09/23/2021 Prepared by: Roxana Hires  Exercises - Seated Scapular Retraction  - 1 x daily - 7 x weekly - 2 sets - 10 reps - 2s hold - Standing Shoulder Row with Anchored Resistance  - 1 x daily - 7 x weekly - 2 sets - 10 reps - 2s hold - Shoulder W - External Rotation with Resistance  - 1 x daily - 7 x weekly - 2 sets - 10 reps - 2s hold   ASSESSMENT:  CLINICAL IMPRESSION: Pt demonstrated good effort and motivation within session. PT removed all rowing/pulling exercises due to  continued irritation in periscapular musculature during performance. PT also advised pt to remove these specific exercises from HEP temporarily until PT advises to reintegrate. Trigger points discovered in right anterior deltoid and periscapular musculature; performed STM to both. Relaxation created with trigger point release and PT instructing diaphragmatic breathing. Location of periscapular trigger points shifted as pt moved UE positioning. Pt encouraged to follow-up as scheduled. Pt will benefit from PT services to address deficits in strength, pain, and range of motion in order to return to full function at home.     REHAB POTENTIAL: Fair secondary to multiple comorbidities and ongoing chronic neck pain  CLINICAL DECISION MAKING: Unstable/unpredictable  EVALUATION COMPLEXITY: High   GOALS: Goals reviewed with patient? Yes  SHORT TERM GOALS: Target date: 10/14/2021   Pt will be independent with HEP to  improve strength and decrease shoulder pain to improve pain-free function at home and work. Baseline:  Goal status: INITIAL   LONG TERM GOALS: Target date: 11/10/2021   Pt will increase FOTO to at least 62 to demonstrate significant improvement in function at home and work related to neck pain  Baseline: 09/15/21: 50 Goal status: INITIAL  2.  Pt will decrease worst shoulder pain by at least 3 points on the NPRS in order to demonstrate clinically significant reduction in shoulder pain. Baseline: 09/15/21: worst: 7/10; Goal status: INITIAL  3.  Pt will decrease quick DASH score by at least 8% in order to demonstrate clinically significant reduction in disability related to shoulder pain        Baseline: 09/15/21: 37.5% Goal status: INITIAL  4. Pt will increase strength of pain-free R shoulder external rotation to at least 4/5 in order to demonstrate improvement in strength and function of RUE       Baseline: 09/15/21: 3+/5 painful Goal status: INITIAL   PLAN: PT FREQUENCY: 1-2x/week  PT DURATION: 8 weeks  PLANNED INTERVENTIONS: Therapeutic exercises, Therapeutic activity, Neuromuscular re-education, Balance training, Gait training, Patient/Family education, Joint manipulation, Joint mobilization, Vestibular training, Canalith repositioning, Aquatic Therapy, Dry Needling, Electrical stimulation, Spinal manipulation, Spinal mobilization, Cryotherapy, Moist heat, Traction, Ultrasound, Ionotophoresis '4mg'$ /ml Dexamethasone, and Manual therapy  PLAN FOR NEXT SESSION: Re-test for BPPV and treat as indicated, continue manual techniques and strengthening (primary focus), review and modify HEP as needed   Patrina Levering PT, DPT

## 2021-10-11 NOTE — Therapy (Signed)
OUTPATIENT PHYSICAL THERAPY SHOULDER TREATMENT   Patient Name: Bobby Chen MRN: [id number] DOB:Nov 17, 1945, 76 y.o., male Today's Date: 10/12/2021   PT End of Session - 10/12/21 0945     Visit Number 7    Number of Visits 17    Date for PT Re-Evaluation 11/10/21    Authorization Type eval: 09/15/21    PT Start Time 0942    PT Stop Time 1025    PT Time Calculation (min) 43 min    Activity Tolerance Patient tolerated treatment well    Behavior During Therapy [facility] for tasks assessed/performed             Past Medical History:  Diagnosis Date   Anginal pain ([place])    Brown recluse spider bite    Cancer ([place])    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, LEFT ARM    COPD (chronic obstructive pulmonary disease) ([place])    Diabetes mellitus without complication ([place])    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[place])    Elevated lipids    Hypertension    Obesity    Pulmonary arterial hypertension ([place])    Sleep apnea    Past Surgical History:  Procedure Laterality Date   ANKLE FRACTURE SURGERY     BASAL CELL CARCINOMA EXCISION N/A    scalp   COLONOSCOPY W/ POLYPECTOMY     COLONOSCOPY WITH PROPOFOL N/A 01/18/2016   Procedure: COLONOSCOPY WITH PROPOFOL;  Surgeon: Bobby Sails, MD;  Location[facility] ENDOSCOPY;  Service: Endoscopy;  Laterality: N/A;   COLONOSCOPY WITH PROPOFOL N/A 07/02/2018   Procedure: COLONOSCOPY WITH PROPOFOL;  Surgeon: Bobby Sails, MD;  Location: [facility] ENDOSCOPY;  Service: Endoscopy;  Laterality: N/A;   ESOPHAGOGASTRODUODENOSCOPY (EGD) WITH PROPOFOL N/A 01/18/2016   Procedure: ESOPHAGOGASTRODUODENOSCOPY (EGD) WITH PROPOFOL;  Surgeon: Bobby Sails, MD;  Location: [facility] ENDOSCOPY;  Service: Endoscopy;  Laterality: N/A;   FRACTURE SURGERY Right 1984   ankle   HERNIA REPAIR     UMBILICAL   LEG SURGERY Left    NASAL SEPTOPLASTY W/ TURBINOPLASTY Bilateral 11/06/2019   Procedure: NASAL SEPTOPLASTY WITH INFERIOR TURBINATE REDUCTION;  Surgeon: Bobby Sheffield, MD;  Location: [facility] ORS;  Service: ENT;   Laterality: Bilateral;   toenail removal Bilateral    Patient Active Problem List   Diagnosis Date Noted   Leg pain, right 08/26/2019   Chronic venous insufficiency 08/26/2019   PAD (peripheral artery disease) ([place]) 08/14/2019   Obstructive sleep apnea 05/06/2019   Obesity, Class II, BMI 35-39.9 03/11/2019   Type 2 diabetes mellitus with stage 3a chronic kidney disease, without long-term current use of insulin ([place]) 03/11/2019   Pain in joint involving ankle and foot 11/12/2018   Nuclear sclerotic cataract of both eyes 08/30/2018   Abnormal sensation in left ear 08/20/2018   Dizziness 08/20/2018   Numbness and tingling of left side of face 08/20/2018   Essential hypertension with goal blood pressure less than 130/80 03/19/2018   Healthcare maintenance 08/07/2017   Brown recluse spider bite 05/08/2017   SCC (squamous cell carcinoma), 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/2015   Severe obesity (BMI 35.0-35.9 with comorbidity) ([place]) 06/01/2015   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[place]) 09/29/2013   Hyperlipidemia associated with type 2 diabetes mellitus ([place]) 09/29/2013    PCP: Bobby Ruths, MD  REFERRING PROVIDER: Kirk Ruths, MD  REFERRING DIAGNOSIS: M25.511 (ICD-10-CM) - Pain in right shoulder W19.XXXA (ICD-10-CM) - Unspecified fall, initial encounter M62.838 (ICD-10-CM) - Other muscle spasm M75.41 (ICD-10-CM) - Impingement syndrome of right shoulder   THERAPY DIAG: Dizziness and giddiness  Right  shoulder pain, unspecified chronicity  RATIONALE FOR EVALUATION AND TREATMENT: Rehabilitation  ONSET DATE: 06/23/21 (approximate)  FOLLOW UP APPT WITH PROVIDER: Yes, returns in June 2023 to see [name]   From initial evaluation  SUBJECTIVE:                                                                                                                                                                                         Chief Complaint: R shoulder pain  Pertinent History Pt referred to PT for  R shoulder pain by orthopedics. His pain began after a fall at the end of March 2023. He was walking down steps and started leaning forward. He responded by leaning back and he fell backwards bracing himself with his arms. He experienced immediate R shoulder pain. He denies any additional falls in the last 6 months but he did have a fall about 1 year ago. He had an evaluation of his R shoulder pain at his PCP office. Plain films of his thoracic spine and ribs were obtained which did not show any rib fractures but multilevel disc changes in his thoracic spine. He reports that prior to his fall he was receiving physical therapy for his cervical stenosis with notable improvement in his pain and mobility. He was last evaluated by physiatry on 08/26/2021 for his neck pain that radiates into his right arm. This was suspected to be due to his nerve root impingement seen on recent cervical spine MRI. There is also question of carpal tunnel syndrome. He was recommended EMG of the bilateral upper extremities and epidural steroid injection to his cervical spine. Pt states that [facility] PT told him there were "unable to see him" for his R shoulder pain. Since his fall he has had more pain along his posterior right shoulder. The pain is "under my shoulder blade" and it radiates around his right trunk. He points to his lateral R ribs, scapula, and periscapular area mostly.  He denies associated swelling, shoulder locking/catching, shoulder instability, weakness, fevers or chills, night sweats, weight loss, skin color change. He has tried acetaminophen, [place] dream cream, activity modification, chronic medication nortriptyline/pregabalin/tramadol with continued symptoms. He is right hand dominant and is retired from being a Art gallery manager. He also was previously in the [employer]   Pain:  Pain Intensity: Present: 0/10, Best: 0/10, Worst: 7/10 Pain location: R thoracic paraspinal/periscapular region Pain Quality: dull   Radiating: Yes, around the R ribs  Numbness/Tingling: No, He reports questionable minor numbness around R ribs Focal Weakness: No, not related to current R shoulder pain Aggravating factors: leaning to the left, standing forward from a chair, bending forward "makes  me feel drunk," reaching out with RUE; Relieving factors: Tylenol, topical analgesic provides temporary relief, no help with ice/heat,  24-hour pain behavior: "All day long" History of prior shoulder or neck/shoulder injury, pain, surgery, or therapy: Yes, see history Falls: Has patient fallen in last 6 months? Yes, Number of falls: 1 Dominant hand: right Imaging: Yes, Right Shoulder Radiographs - 3 views (Grashey, Axillary, Scapular Y) performed 08/31/2021: Type I acromion. Mild AC joint degenerative change with osteophyte formation. The distal clavicle, scapula, and proximal humerus are intact. Glenohumeral and acromioclavicular joint alignment appears normal. No fractures or dislocations. Well maintained glenohumeral joint space without evidence of degenerative joint disease. No soft tissue swelling or joint effusion. No destructive bony lesion is identified.  Cervical spine MRI without contrast performed 04/15/2021 IMPRESSION: Cervical spondylosis, as outlined and with findings most notably as follows: At C5-C6, a moderate-sized central disc protrusion contributes to multifactorial severe spinal canal stenosis with moderate spinal cord flattening. No definite spinal cord signal abnormality is identified, although motion degradation limits evaluation. Multifactorial bilateral neural foraminal narrowing (moderate right, severe left). No more than mild relative spinal canal narrowing at the remaining levels. Of note, a right center disc protrusion contacts the ventral spinal cord at C6-C7. Additional sites of foraminal stenosis, as detailed and greatest on the left at C3-C4 (moderate) and on the right at C6-C7 (moderate).    Prior level of  function: Independent Occupational demands: Retired Chief Executive Officer: Working in his Survey[employer], quantity flags: Positive: chills (6-8 months), night sweats, skin cancer; Negative: nausea, vomiting  Precautions: None  Weight Bearing Restrictions: No  Living Environment Lives with: lives with their spouse Lives in: House/apartment, 2 steps to enter with a post to hold on the right side  Patient Goals: Decrease pain   OBJECTIVE:   Patient Surveys  FOTO: 73, predicted improvement to 62 QuickDASH: 37.5%  Cognition Patient is oriented to person, place, and time.  Recent memory is intact.  Remote memory is intact.  Attention span and concentration are intact.  Expressive speech is intact.  Patient's fund of knowledge is within normal limits for educational level. Conversation is tangential with frequent diversions     Gross Musculoskeletal Assessment Tremor: None Bulk: Normal Tone: Normal Overweight  Gait Full gait assessment deferred  Posture Forward head/rounded shoulders in sitting and standing   Cervical Screen Spurlings A (ipsilateral lateral flexion/axial compression): R: Positive for reproduction of periscapular pain L: Positive Spurlings B (ipsilateral lateral flexion/contralateral rotation/axial compression): R: Positive for reproduction of periscapular pain L: Positive Repeated movement: Not performed Hoffman Sign (cervical cord compression): R: Negative L: Negative ULTT Median: R: Not examined L: Not examined ULTT Ulnar: R: Not examined L: Not examined ULTT Radial: R: Not examined L: Not examined   AROM  AROM (Normal range in degrees) AROM 10/12/2021  Cervical  Flexion (50) 43  Extension (80) 23*  Right lateral flexion (45) 35*  Left lateral flexion (45) 35  Right rotation (85)   Left rotation (85)    Right Left  Shoulder    Flexion    Extension    Abduction    External Rotation    Internal Rotation    Hands Behind Head    Hands Behind Back         Elbow    Flexion    Extension    Pronation    Supination    (* = pain; Blank rows = not tested)    LE MMT:  MMT (out of 5)  Right 10/12/2021 Left 10/12/2021  Cervical (isometric)  Flexion Weak  Extension Weak  Lateral Flexion Weak Weak  Rotation Weak Weak      Shoulder   Flexion 4 4  Extension    Abduction 4+ 4  External rotation 3+* 3+  Internal rotation 5 5  Horizontal abduction    Horizontal adduction    Lower Trapezius    Rhomboids        Elbow  Flexion 5 5  Extension 5 5  Pronation    Supination        Wrist  Flexion 5 5  Extension 5 5  Radial deviation    Ulnar deviation        MCP  Flexion 5 5  Extension 5 5  Abduction    Adduction    (* = pain; Blank rows = not tested)  Positive reproduction of concordant pain with resisted L shoulder ER  Sensation Deferred  Reflexes Deferred   Palpation  Location LEFT  RIGHT           Subocciptials  0  Cervical paraspinals  2  Upper Trapezius    Levator Scapulae    Rhomboid Major/Minor  2  Sternoclavicular joint    Acromioclavicular joint    Coracoid process    Long head of biceps  2  Supraspinatus    Infraspinatus    Subscapularis    Teres Minor    Teres Major    Pectoralis Major    Pectoralis Minor    Anterior Deltoid  1  Lateral Deltoid    Posterior Deltoid    Latissimus Dorsi    Sternocleidomastoid    (Blank rows = not tested) Graded on 0-4 scale (0 = no pain, 1 = pain, 2 = pain with wincing/grimacing/flinching, 3 = pain with withdrawal, 4 = unwilling to allow palpation), (Blank rows = not tested)   Repeated Movements Deferred  Passive Accessory Intervertebral Motion Deferred   Accessory Motions/Glides Deferred   SPECIAL TESTS Rotator Cuff  Drop Arm Test: Negative Painful Arc (Pain from 60 to 120 degrees scaption): Negative Infraspinatus Muscle Test: Positive   Subacromial Impingement Deferred  Labral Tear Deferred  Bicep Tendon  Pathology Deferred  Shoulder Instability Deferred   Beighton scale: Deferred  FROM 09/23/21 VISIT:  AROM  AROM (Normal range in degrees) AROM 09/23/2021  Cervical  Flexion (50) 43  Extension (80) 23*  Right lateral flexion (45) 35*  Left lateral flexion (45) 35  Right rotation (85)   Left rotation (85)    Right Left  Shoulder    Flexion 160* 160*  Extension    Abduction 145* 120*  External Rotation 60* 55*  Internal Rotation 86 85  Hands Behind Head    Hands Behind Back        Elbow    Flexion WNL   Extension WNL   Pronation    Supination    (* = pain; Blank rows = not tested)   Passive Accessory Intervertebral Motion Pt denies reproduction of shoulder pain with CPA C2-T7 and R UPA C2-T7 however he is tender centrally to pressure. Generally, hypomobile throughout however difficult to assess mobility secondary to pain;  Accessory Motions/Glides Glenohumeral: Posterior: R: Painful, unable to fully assess L: not examined Inferior: R: abnormal, hypomobile and painful L: not examined Anterior: R: normal L: not examined  Acromioclavicular:  Posterior: R: Too painful to assess fully  L: not examined Anterior: R: Too painful to assess fully L: not examined  Sternoclavicular: Posterior:  R: Too painful to assess fully L: not examined Anterior: R: Too painful to assess fully L: not examined Superior: R: Too painful to assess fully L: not examined Inferior: R: Too painful to assess fully L: not examined  Scapulothoracic: Painful in all directions so unable to fully assess mobility;   Muscle Length Testing Deferred   SPECIAL TESTS (all on right side)  Subacromial Impingement Hawkins-Kennedy: Positive Neer (Block scapula, PROM flexion): Positive Painful Arc (Pain from 60 to 120 degrees scaption): Negative Empty Can: Positive External Rotation Resistance: Positive Horizontal Adduction: Negative Scapular Assist: Not examined  Labral Tear Biceps Load II (120  elevation, full ER, 90 elbow flexion, full supination, resisted elbow flexion): Negative Crank (160 scaption, axial load with IR/ER): Positive Active Compression Test: Negative  Bicep Tendon Pathology Speed (shoulder flexion to 90, external rotation, full elbow extension, and forearm supination with resistance: Negative Yergason's (resisted shoulder ER and supination/biceps tendon pathology): Not examined  Shoulder Instability Sulcus Sign: Positive Anterior Apprehension: Negative    TODAY'S TREATMENT   SUBJECTIVE: Pt reports that he had one episode of vertigo the day following his last BPPV treatment. However he has had no further episodes of vertigo since that time. He reports 5/10 R shoulder shoulder pain upon arrival. No specific questions currently.  PAIN: 5/10 R shoulder pain  Neuromuscular Re-education  Dix-Hallpike (inverted mat table) and Roll Tests are negative bilaterally for both vertigo and nystagmus;   Ther-ex  UBE x 5 minutes for arm-up during interval history (2.5 min forward/2.5 min backward); Seated Nautilus lat pull down with 50# x 10, 60# x 10, 70# x 10; Seated Nautilus rows with 40# x 10, 50# x 10, third set deferred due to the start of pain in R flank; Seated "W's" with red tband 2 x 10; Seated scapular retractions 2 x 10; Standing Nautilus chest press with 40# 2 x 10   Not performed today: Supine R shoulder flexion with 2# dumbbell (DB) 2 x 10; Supine R shoulder circles (CW and CCW) with 2# DB 2 x 10; Supine R shoulder serratus punch with 2# DB 2 x 10; L sidelying R shoulder abduction with 2# DB 2 x 10; L sidelying R shoulder ER with 2# DB 2 x 10; Seated R shoulder extension with red tband 2 x 10;   PATIENT EDUCATION:  Education details: Pt educated throughout session about proper posture and technique with exercises. Improved exercise technique, movement at target joints, use of target muscles after min to mod verbal, visual, tactile cues. Education  about different possible causes for patient's dizziness. Person educated: Patient Education method: Explanation, verbal cues, tactile cues, and handout Education comprehension: verbalized understanding,    HOME EXERCISE PROGRAM: Access Code: [email or web address] URL: [email or web address] Date: 09/23/2021 Prepared by: Roxana Hires  Exercises - Seated Scapular Retraction  - 1 x daily - 7 x weekly - 2 sets - 10 reps - 2s hold - Standing Shoulder Row with Anchored Resistance  - 1 x daily - 7 x weekly - 2 sets - 10 reps - 2s hold - Shoulder W - External Rotation with Resistance  - 1 x daily - 7 x weekly - 2 sets - 10 reps - 2s hold   ASSESSMENT:  CLINICAL IMPRESSION: Pt reports only one episode of vertigo over the last 10 days and it occurred the day following his last BPPV treatment. All BPPV testing is negative for both vertigo and nystagmus during session today. Proceeded to continue with pain-free strengthening for the rest of  session today. Pt is still TTP focally on R flank over his R ribs. Pt encouraged to continue HEP and follow-up as scheduled. He will benefit from PT services to address deficits in strength, pain, and range of motion in order to return to full function at home.    REHAB POTENTIAL: Fair secondary to multiple comorbidities and ongoing chronic neck pain  CLINICAL DECISION MAKING: Unstable/unpredictable  EVALUATION COMPLEXITY: High   GOALS: Goals reviewed with patient? Yes  SHORT TERM GOALS: Target date: 10/14/2021   Pt will be independent with HEP to improve strength and decrease shoulder pain to improve pain-free function at home and work. Baseline:  Goal status: INITIAL   LONG TERM GOALS: Target date: 11/10/2021   Pt will increase FOTO to at least 62 to demonstrate significant improvement in function at home and work related to neck pain  Baseline: 09/15/21: 50 Goal status: INITIAL  2.  Pt will decrease worst shoulder pain by at least 3 points on  the NPRS in order to demonstrate clinically significant reduction in shoulder pain. Baseline: 09/15/21: worst: 7/10; Goal status: INITIAL  3.  Pt will decrease quick DASH score by at least 8% in order to demonstrate clinically significant reduction in disability related to shoulder pain        Baseline: 09/15/21: 37.5% Goal status: INITIAL  4. Pt will increase strength of pain-free R shoulder external rotation to at least 4/5 in order to demonstrate improvement in strength and function of RUE       Baseline: 09/15/21: 3+/5 painful Goal status: INITIAL   PLAN: PT FREQUENCY: 1-2x/week  PT DURATION: 8 weeks  PLANNED INTERVENTIONS: Therapeutic exercises, Therapeutic activity, Neuromuscular re-education, Balance training, Gait training, Patient/Family education, Joint manipulation, Joint mobilization, Vestibular training, Canalith repositioning, Aquatic Therapy, Dry Needling, Electrical stimulation, Spinal manipulation, Spinal mobilization, Cryotherapy, Moist heat, Traction, Ultrasound, Ionotophoresis '4mg'$ /ml Dexamethasone, and Manual therapy  PLAN FOR NEXT SESSION: Re-test for BPPV and treat as indicated, continue manual techniques and strengthening (primary focus), review and modify HEP as needed  Lyndel Safe Lavilla Delamora PT, DPT, GCS  Shan Padgett 10/12/2021, 10:42 AM

## 2021-10-12 ENCOUNTER — Ambulatory Visit: Payer: Medicare Other

## 2021-10-12 DIAGNOSIS — R42 Dizziness and giddiness: Secondary | ICD-10-CM

## 2021-10-12 DIAGNOSIS — M25511 Pain in right shoulder: Secondary | ICD-10-CM

## 2021-10-13 NOTE — Therapy (Signed)
OUTPATIENT PHYSICAL THERAPY SHOULDER TREATMENT   Patient Name: Bobby Chen MRN: [id number] DOB:06-Jul-1945, 76 y.o., male Today's Date: 10/14/2021   PT End of Session - 10/14/21 0931     Visit Number 8    Number of Visits 17    Date for PT Re-Evaluation 11/10/21    Authorization Type eval: 09/15/21    PT Start Time 0931    PT Stop Time 1015    PT Time Calculation (min) 44 min    Activity Tolerance Patient tolerated treatment well    Behavior During Therapy [facility] for tasks assessed/performed             Past Medical History:  Diagnosis Date   Anginal pain ([place])    Brown recluse spider bite    Cancer ([place])    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, LEFT ARM    COPD (chronic obstructive pulmonary disease) ([place])    Diabetes mellitus without complication ([place])    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[place])    Elevated lipids    Hypertension    Obesity    Pulmonary arterial hypertension ([place])    Sleep apnea    Past Surgical History:  Procedure Laterality Date   ANKLE FRACTURE SURGERY     BASAL CELL CARCINOMA EXCISION N/A    scalp   COLONOSCOPY W/ POLYPECTOMY     COLONOSCOPY WITH PROPOFOL N/A 01/18/2016   Procedure: COLONOSCOPY WITH PROPOFOL;  Surgeon: Lollie Sails, MD;  Location: [facility] ENDOSCOPY;  Service: Endoscopy;  Laterality: N/A;   COLONOSCOPY WITH PROPOFOL N/A 07/02/2018   Procedure: COLONOSCOPY WITH PROPOFOL;  Surgeon: Lollie Sails, MD;  Location: [facility] ENDOSCOPY;  Service: Endoscopy;  Laterality: N/A;   ESOPHAGOGASTRODUODENOSCOPY (EGD) WITH PROPOFOL N/A 01/18/2016   Procedure: ESOPHAGOGASTRODUODENOSCOPY (EGD) WITH PROPOFOL;  Surgeon: Lollie Sails, MD;  Location: [facility] ENDOSCOPY;  Service: Endoscopy;  Laterality: N/A;   FRACTURE SURGERY Right 1984   ankle   HERNIA REPAIR     UMBILICAL   LEG SURGERY Left    NASAL SEPTOPLASTY W/ TURBINOPLASTY Bilateral 11/06/2019   Procedure: NASAL SEPTOPLASTY WITH INFERIOR TURBINATE REDUCTION;  Surgeon: Margaretha Sheffield, MD;  Location: [facility] ORS;  Service: ENT;   Laterality: Bilateral;   toenail removal Bilateral    Patient Active Problem List   Diagnosis Date Noted   Leg pain, right 08/26/2019   Chronic venous insufficiency 08/26/2019   PAD (peripheral artery disease) ([place]) 08/14/2019   Obstructive sleep apnea 05/06/2019   Obesity, Class II, BMI 35-39.9 03/11/2019   Type 2 diabetes mellitus with stage 3a chronic kidney disease, without long-term current use of insulin ([place]) 03/11/2019   Pain in joint involving ankle and foot 11/12/2018   Nuclear sclerotic cataract of both eyes 08/30/2018   Abnormal sensation in left ear 08/20/2018   Dizziness 08/20/2018   Numbness and tingling of left side of face 08/20/2018   Essential hypertension with goal blood pressure less than 130/80 03/19/2018   Healthcare maintenance 08/07/2017   Brown recluse spider bite 05/08/2017   SCC (squamous cell carcinoma), 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/2015   Severe obesity (BMI 35.0-35.9 with comorbidity) ([place]) 06/01/2015   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[place]) 09/29/2013   Hyperlipidemia associated with type 2 diabetes mellitus ([place]) 09/29/2013    PCP: Kirk Ruths, MD  REFERRING PROVIDER: Kirk Ruths, MD  REFERRING DIAGNOSIS: M25.511 (ICD-10-CM) - Pain in right shoulder W19.XXXA (ICD-10-CM) - Unspecified fall, initial encounter M62.838 (ICD-10-CM) - Other muscle spasm M75.41 (ICD-10-CM) - Impingement syndrome of right shoulder   THERAPY DIAG: Dizziness and giddiness  Right  shoulder pain, unspecified chronicity  RATIONALE FOR EVALUATION AND TREATMENT: Rehabilitation  ONSET DATE: 06/23/21 (approximate)  FOLLOW UP APPT WITH PROVIDER: Yes, returns in June 2023 to see [name]   From initial evaluation  SUBJECTIVE:                                                                                                                                                                                         Chief Complaint: R shoulder pain  Pertinent History Pt referred to PT for  R shoulder pain by orthopedics. His pain began after a fall at the end of March 2023. He was walking down steps and started leaning forward. He responded by leaning back and he fell backwards bracing himself with his arms. He experienced immediate R shoulder pain. He denies any additional falls in the last 6 months but he did have a fall about 1 year ago. He had an evaluation of his R shoulder pain at his PCP office. Plain films of his thoracic spine and ribs were obtained which did not show any rib fractures but multilevel disc changes in his thoracic spine. He reports that prior to his fall he was receiving physical therapy for his cervical stenosis with notable improvement in his pain and mobility. He was last evaluated by physiatry on 08/26/2021 for his neck pain that radiates into his right arm. This was suspected to be due to his nerve root impingement seen on recent cervical spine MRI. There is also question of carpal tunnel syndrome. He was recommended EMG of the bilateral upper extremities and epidural steroid injection to his cervical spine. Pt states that [facility] PT told him there were "unable to see him" for his R shoulder pain. Since his fall he has had more pain along his posterior right shoulder. The pain is "under my shoulder blade" and it radiates around his right trunk. He points to his lateral R ribs, scapula, and periscapular area mostly.  He denies associated swelling, shoulder locking/catching, shoulder instability, weakness, fevers or chills, night sweats, weight loss, skin color change. He has tried acetaminophen, [place] dream cream, activity modification, chronic medication nortriptyline/pregabalin/tramadol with continued symptoms. He is right hand dominant and is retired from being a Art gallery manager. He also was previously in the [employer]   Pain:  Pain Intensity: Present: 0/10, Best: 0/10, Worst: 7/10 Pain location: R thoracic paraspinal/periscapular region Pain Quality: dull   Radiating: Yes, around the R ribs  Numbness/Tingling: No, He reports questionable minor numbness around R ribs Focal Weakness: No, not related to current R shoulder pain Aggravating factors: leaning to the left, standing forward from a chair, bending forward "makes  me feel drunk," reaching out with RUE; Relieving factors: Tylenol, topical analgesic provides temporary relief, no help with ice/heat,  24-hour pain behavior: "All day long" History of prior shoulder or neck/shoulder injury, pain, surgery, or therapy: Yes, see history Falls: Has patient fallen in last 6 months? Yes, Number of falls: 1 Dominant hand: right Imaging: Yes, Right Shoulder Radiographs - 3 views (Gr[place]hey, Axillary, Scapular Y) performed 08/31/2021: Type I acromion. Mild AC joint degenerative change with osteophyte formation. The distal clavicle, scapula, and proximal humerus are intact. Glenohumeral and acromioclavicular joint alignment appears normal. No fractures or dislocations. Well maintained glenohumeral joint space without evidence of degenerative joint disease. No soft tissue swelling or joint effusion. No destructive bony lesion is identified.  Cervical spine MRI without contrast performed 04/15/2021 IMPRESSION: Cervical spondylosis, as outlined and with findings most notably as follows: At C5-C6, a moderate-sized central disc protrusion contributes to multifactorial severe spinal canal stenosis with moderate spinal cord flattening. No definite spinal cord signal abnormality is identified, although motion degradation limits evaluation. Multifactorial bilateral neural foraminal narrowing (moderate right, severe left). No more than mild relative spinal canal narrowing at the remaining levels. Of note, a right center disc protrusion contacts the ventral spinal cord at C6-C7. Additional sites of foraminal stenosis, as detailed and greatest on the left at C3-C4 (moderate) and on the right at C6-C7 (moderate).    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  Recent memory is intact.  Remote memory is intact.  Attention span and concentration are intact.  Expressive speech is intact.  Patient's fund of knowledge is within normal limits for educational level. Conversation is tangential with frequent diversions     Gross Musculoskeletal Assessment Tremor: None Bulk: Normal Tone: Normal Overweight  Gait Full gait assessment deferred  Posture Forward head/rounded shoulders in sitting and standing   Cervical Screen Spurlings A (ipsilateral lateral flexion/axial compression): R: Positive for reproduction of periscapular pain L: Positive Spurlings B (ipsilateral lateral flexion/contralateral rotation/axial compression): R: Positive for reproduction of periscapular pain L: Positive Repeated movement: Not performed Hoffman Sign (cervical cord compression): R: Negative L: Negative ULTT Median: R: Not examined L: Not examined ULTT Ulnar: R: Not examined L: Not examined ULTT Radial: R: Not examined L: Not examined   AROM  AROM (Normal range in degrees) AROM 10/14/2021  Cervical  Flexion (50) 43  Extension (80) 23*  Right lateral flexion (45) 35*  Left lateral flexion (45) 35  Right rotation (85)   Left rotation (85)    Right Left  Shoulder    Flexion    Extension    Abduction    External Rotation    Internal Rotation    Hands Behind Head    Hands Behind Back         Elbow    Flexion    Extension    Pronation    Supination    (* = pain; Blank rows = not tested)    LE MMT:  MMT (out of 5)  Right 10/14/2021 Left 10/14/2021  Cervical (isometric)  Flexion Weak  Extension Weak  Lateral Flexion Weak Weak  Rotation Weak Weak      Shoulder   Flexion 4 4  Extension    Abduction 4+ 4  External rotation 3+* 3+  Internal rotation 5 5  Horizontal abduction    Horizontal adduction    Lower Trapezius    Rhomboids        Elbow  Flexion 5 5  Extension 5 5  Pronation    Supination        Wrist  Flexion 5 5  Extension 5 5  Radial deviation    Ulnar deviation        MCP  Flexion 5 5  Extension 5 5  Abduction    Adduction    (* = pain; Blank rows = not tested)  Positive reproduction of concordant pain with resisted L shoulder ER  Sensation Deferred  Reflexes Deferred   Palpation  Location LEFT  RIGHT           Subocciptials  0  Cervical paraspinals  2  Upper Trapezius    Levator Scapulae    Rhomboid Major/Minor  2  Sternoclavicular joint    Acromioclavicular joint    Coracoid process    Long head of biceps  2  Supraspinatus    Infraspinatus    Subscapularis    Teres Minor    Teres Major    Pectoralis Major    Pectoralis Minor    Anterior Deltoid  1  Lateral Deltoid    Posterior Deltoid    Latissimus Dorsi    Sternocleidomastoid    (Blank rows = not tested) Graded on 0-4 scale (0 = no pain, 1 = pain, 2 = pain with wincing/grimacing/flinching, 3 = pain with withdrawal, 4 = unwilling to allow palpation), (Blank rows = not tested)   Repeated Movements Deferred  Passive Accessory Intervertebral Motion Deferred   Accessory Motions/Glides Deferred   SPECIAL TESTS Rotator Cuff  Drop Arm Test: Negative Painful Arc (Pain from 60 to 120 degrees scaption): Negative Infraspinatus Muscle Test: Positive   Subacromial Impingement Deferred  Labral Tear Deferred  Bicep Tendon  Pathology Deferred  Shoulder Instability Deferred   Beighton scale: Deferred  FROM 09/23/21 VISIT:  AROM  AROM (Normal range in degrees) AROM 09/23/2021  Cervical  Flexion (50) 43  Extension (80) 23*  Right lateral flexion (45) 35*  Left lateral flexion (45) 35  Right rotation (85)   Left rotation (85)    Right Left  Shoulder    Flexion 160* 160*  Extension    Abduction 145* 120*  External Rotation 60* 55*  Internal Rotation 86 85  Hands Behind Head    Hands Behind Back        Elbow    Flexion WNL   Extension WNL   Pronation    Supination    (* = pain; Blank rows = not tested)  Passive Accessory Intervertebral Motion Pt denies reproduction of shoulder pain with CPA C2-T7 and R UPA C2-T7 however he is tender centrally to pressure. Generally, hypomobile throughout however difficult to assess mobility secondary to pain;  Accessory Motions/Glides Glenohumeral: Posterior: R: Painful, unable to fully assess L: not examined Inferior: R: abnormal, hypomobile and painful L: not examined Anterior: R: normal L: not examined  Acromioclavicular:  Posterior: R: Too painful to assess fully  L: not examined Anterior: R: Too painful to assess fully L: not examined  Sternoclavicular: Posterior: R:  Too painful to assess fully L: not examined Anterior: R: Too painful to assess fully L: not examined Superior: R: Too painful to assess fully L: not examined Inferior: R: Too painful to assess fully L: not examined  Scapulothoracic: Painful in all directions so unable to fully assess mobility;   Muscle Length Testing Deferred   SPECIAL TESTS (all on right side)  Subacromial Impingement Hawkins-Kennedy: Positive Neer (Block scapula, PROM flexion): Positive Painful Arc (Pain from 60 to 120 degrees scaption): Negative Empty Can: Positive External Rotation Resistance: Positive Horizontal Adduction: Negative Scapular Assist: Not examined  Labral Tear Biceps Load II (120  elevation, full ER, 90 elbow flexion, full supination, resisted elbow flexion): Negative Crank (160 scaption, axial load with IR/ER): Positive Active Compression Test: Negative  Bicep Tendon Pathology Speed (shoulder flexion to 90, external rotation, full elbow extension, and forearm supination with resistance: Negative Yergason's (resisted shoulder ER and supination/biceps tendon pathology): Not examined  Shoulder Instability Sulcus Sign: Positive Anterior Apprehension: Negative    TODAY'S TREATMENT   SUBJECTIVE: Pt reports that he has had no further episodes of vertigo since the last therapy session. No R shoulder/flank pain upon arrival. No specific questions currently.   PAIN: Denies   Neuromuscular Re-education  [place]-Hallpike (inverted mat table) and Roll Tests are negative bilaterally for both vertigo and nystagmus;   Ther-ex  NuStep (seat 9, arms 10) L1-3 BUE/BLE x 5 minutes for warm-up during interval history (2 minutes unbilled); Supine R shoulder flexion with 3# dumbbell (DB) 2 x 15; Supine R shoulder circles (CW and CCW) with 3# DB 2 x 15; Supine R shoulder serratus punch with 3# DB 2 x 15; L sidelying R shoulder abduction with 3# DB 2 x 15; L sidelying R shoulder ER with 3# DB 2 x 15; Seated "W's" with green tband 2 x 15; Seated scapular retractions 2 x 15;   Not performed today: Seated R shoulder extension with red tband 2 x 10; Standing Nautilus chest press with 40# 2 x 10 Seated R shoulder extension with red tband 2 x 10; Seated Nautilus lat pull down with 50# x 10, 60# x 10, 70# x 10; Seated Nautilus rows with 40# x 10, 50# x 10, third set deferred due to the start of pain in R flank;   PATIENT EDUCATION:  Education details: Pt educated throughout session about proper posture and technique with exercises. Improved exercise technique, movement at target joints, use of target muscles after min to mod verbal, visual, tactile cues. Education about different  possible causes for patient's dizziness. Person educated: Patient Education method: Explanation, verbal cues, tactile cues, and handout Education comprehension: verbalized understanding,    HOME EXERCISE PROGRAM: Access Code: [email or web address] URL: [email or web address] Date: 09/23/2021 Prepared by: Roxana Hires  Exercises - Seated Scapular Retraction  - 1 x daily - 7 x weekly - 2 sets - 10 reps - 2s hold - Standing Shoulder Row with Anchored Resistance  - 1 x daily - 7 x weekly - 2 sets - 10 reps - 2s hold - Shoulder W - External Rotation with Resistance  - 1 x daily - 7 x weekly - 2 sets - 10 reps - 2s hold   ASSESSMENT:  CLINICAL IMPRESSION: Pt denies any further episodes of vertigo and all BPPV testing is negative for both vertigo and nystagmus during session today. Proceeded to continue with pain-free strengthening for the rest of session today. Increased resistance as well as reps with patient. Pt encouraged to continue HEP and follow-up  as scheduled. He will benefit from PT services to address deficits in strength, pain, and range of motion in order to return to full function at home.    REHAB POTENTIAL: Fair secondary to multiple comorbidities and ongoing chronic neck pain  CLINICAL DECISION MAKING: Unstable/unpredictable  EVALUATION COMPLEXITY: High   GOALS: Goals reviewed with patient? Yes  SHORT TERM GOALS: Target date: 10/14/2021   Pt will be independent with HEP to improve strength and decrease shoulder pain to improve pain-free function at home and work. Baseline:  Goal status: INITIAL   LONG TERM GOALS: Target date: 11/10/2021   Pt will increase FOTO to at least 62 to demonstrate significant improvement in function at home and work related to neck pain  Baseline: 09/15/21: 50 Goal status: INITIAL  2.  Pt will decrease worst shoulder pain by at least 3 points on the NPRS in order to demonstrate clinically significant reduction in shoulder  pain. Baseline: 09/15/21: worst: 7/10; Goal status: INITIAL  3.  Pt will decrease quick DASH score by at least 8% in order to demonstrate clinically significant reduction in disability related to shoulder pain        Baseline: 09/15/21: 37.5% Goal status: INITIAL  4. Pt will increase strength of pain-free R shoulder external rotation to at least 4/5 in order to demonstrate improvement in strength and function of RUE       Baseline: 09/15/21: 3+/5 painful Goal status: INITIAL   PLAN: PT FREQUENCY: 1-2x/week  PT DURATION: 8 weeks  PLANNED INTERVENTIONS: Therapeutic exercises, Therapeutic activity, Neuromuscular re-education, Balance training, Gait training, Patient/Family education, Joint manipulation, Joint mobilization, Vestibular training, Canalith repositioning, Aquatic Therapy, Dry Needling, Electrical stimulation, Spinal manipulation, Spinal mobilization, Cryotherapy, Moist heat, Traction, Ultrasound, Ionotophoresis '4mg'$ /ml Dexamethasone, and Manual therapy  PLAN FOR NEXT SESSION: Re-test for BPPV and treat as indicated, continue manual techniques and strengthening (primary focus), review and modify HEP as needed  Lyndel Safe Kyrie Fludd PT, DPT, GCS  Bobby Chen 10/14/2021, 11:59 AM

## 2021-10-14 ENCOUNTER — Ambulatory Visit: Payer: Medicare Other

## 2021-10-14 DIAGNOSIS — R42 Dizziness and giddiness: Secondary | ICD-10-CM

## 2021-10-14 DIAGNOSIS — M25511 Pain in right shoulder: Secondary | ICD-10-CM

## 2021-10-19 ENCOUNTER — Ambulatory Visit (INDEPENDENT_AMBULATORY_CARE_PROVIDER_SITE_OTHER): Payer: Medicare Other | Admitting: Vascular Surgery

## 2021-10-19 ENCOUNTER — Encounter (INDEPENDENT_AMBULATORY_CARE_PROVIDER_SITE_OTHER): Payer: Self-pay | Admitting: Vascular Surgery

## 2021-10-19 ENCOUNTER — Ambulatory Visit: Payer: Medicare Other

## 2021-10-19 VITALS — BP 145/74 | HR 80 | Resp 16 | Ht 67.0 in | Wt 237.0 lb

## 2021-10-19 DIAGNOSIS — R42 Dizziness and giddiness: Secondary | ICD-10-CM

## 2021-10-19 DIAGNOSIS — M25511 Pain in right shoulder: Secondary | ICD-10-CM

## 2021-10-19 DIAGNOSIS — I10 Essential (primary) hypertension: Secondary | ICD-10-CM

## 2021-10-19 DIAGNOSIS — M25579 Pain in unspecified ankle and joints of unspecified foot: Secondary | ICD-10-CM | POA: Insufficient documentation

## 2021-10-19 DIAGNOSIS — I890 Lymphedema, not elsewhere classified: Secondary | ICD-10-CM | POA: Diagnosis not present

## 2021-10-19 DIAGNOSIS — I872 Venous insufficiency (chronic) (peripheral): Secondary | ICD-10-CM | POA: Diagnosis not present

## 2021-10-19 DIAGNOSIS — E1122 Type 2 diabetes mellitus with diabetic chronic kidney disease: Secondary | ICD-10-CM

## 2021-10-19 DIAGNOSIS — N1831 Chronic kidney disease, stage 3a: Secondary | ICD-10-CM

## 2021-10-19 NOTE — Assessment & Plan Note (Signed)
Repeat reflux study in the near future.

## 2021-10-19 NOTE — Assessment & Plan Note (Signed)
blood glucose control important in reducing the progression of atherosclerotic disease. Also, involved in wound healing. On appropriate medications.  

## 2021-10-19 NOTE — Assessment & Plan Note (Signed)
The patient clearly has at least stage II lymphedema with swelling refractory to compression and elevation.  He has been diligently wearing his compression socks but this has not resulted in any significant improvement in the swelling over the past several months.  He tries to elevate his legs.  At this point, a lymphedema pump would be an excellent adjuvant therapy to try to improve his symptoms.  I would also repeat a reflux study to make sure that worsening venous disease is not present as well now.  I discussed the natural history and pathophysiology of both lymphedema and venous disease with the patient.  He will return with his venous duplex in the near future.

## 2021-10-21 ENCOUNTER — Ambulatory Visit: Payer: Medicare Other

## 2021-10-21 ENCOUNTER — Ambulatory Visit (INDEPENDENT_AMBULATORY_CARE_PROVIDER_SITE_OTHER): Payer: Medicare Other | Admitting: Nurse Practitioner

## 2021-10-21 ENCOUNTER — Encounter (INDEPENDENT_AMBULATORY_CARE_PROVIDER_SITE_OTHER): Payer: Self-pay | Admitting: Nurse Practitioner

## 2021-10-21 ENCOUNTER — Ambulatory Visit (INDEPENDENT_AMBULATORY_CARE_PROVIDER_SITE_OTHER): Payer: Medicare Other

## 2021-10-21 VITALS — BP 164/77 | HR 85 | Resp 16 | Wt 236.6 lb

## 2021-10-21 DIAGNOSIS — I890 Lymphedema, not elsewhere classified: Secondary | ICD-10-CM | POA: Diagnosis not present

## 2021-10-21 DIAGNOSIS — I10 Essential (primary) hypertension: Secondary | ICD-10-CM | POA: Diagnosis not present

## 2021-10-21 DIAGNOSIS — E1122 Type 2 diabetes mellitus with diabetic chronic kidney disease: Secondary | ICD-10-CM

## 2021-10-21 DIAGNOSIS — M25511 Pain in right shoulder: Secondary | ICD-10-CM | POA: Diagnosis not present

## 2021-10-21 DIAGNOSIS — I872 Venous insufficiency (chronic) (peripheral): Secondary | ICD-10-CM

## 2021-10-21 DIAGNOSIS — N1831 Chronic kidney disease, stage 3a: Secondary | ICD-10-CM | POA: Diagnosis not present

## 2021-10-21 DIAGNOSIS — R42 Dizziness and giddiness: Secondary | ICD-10-CM

## 2021-10-21 NOTE — Therapy (Signed)
OUTPATIENT PHYSICAL THERAPY SHOULDER TREATMENT  Dates of reporting period  09/15/21   to   10/21/21    Patient Name: Bobby Chen MRN: [id number] DOB:06/09/1945, 76 y.o., male Today's Date: 10/21/2021   PT End of Session - 10/21/21 0958     Visit Number 10    Number of Visits 17    Date for PT Re-Evaluation 11/10/21    Authorization Type eval: 09/15/21    PT Start Time 0935    PT Stop Time 1015    PT Time Calculation (min) 40 min    Activity Tolerance Patient tolerated treatment well    Behavior During Therapy [facility] for tasks assessed/performed               Past Medical History:  Diagnosis Date   Anginal pain ([place])    Brown recluse spider bite    Cancer ([place])    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, LEFT ARM    COPD (chronic obstructive pulmonary disease) ([place])    Diabetes mellitus without complication ([place])    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[place])    Elevated lipids    Hypertension    Obesity    Pulmonary arterial hypertension ([place])    Sleep apnea    Past Surgical History:  Procedure Laterality Date   ANKLE FRACTURE SURGERY     BASAL CELL CARCINOMA EXCISION N/A    scalp   COLONOSCOPY W/ POLYPECTOMY     COLONOSCOPY WITH PROPOFOL N/A 01/18/2016   Procedure: COLONOSCOPY WITH PROPOFOL;  Surgeon: Lollie Sails, MD;  Location: [facility] ENDOSCOPY;  Service: Endoscopy;  Laterality: N/A;   COLONOSCOPY WITH PROPOFOL N/A 07/02/2018   Procedure: COLONOSCOPY WITH PROPOFOL;  Surgeon: Lollie Sails, MD;  Location: [facility] ENDOSCOPY;  Service: Endoscopy;  Laterality: N/A;   ESOPHAGOGASTRODUODENOSCOPY (EGD) WITH PROPOFOL N/A 01/18/2016   Procedure: ESOPHAGOGASTRODUODENOSCOPY (EGD) WITH PROPOFOL;  Surgeon: Lollie Sails, MD;  Location: [facility] ENDOSCOPY;  Service: Endoscopy;  Laterality: N/A;   FRACTURE SURGERY Right 1984   ankle   HERNIA REPAIR     UMBILICAL   LEG SURGERY Left    NASAL SEPTOPLASTY W/ TURBINOPLASTY Bilateral 11/06/2019   Procedure: NASAL SEPTOPLASTY WITH INFERIOR TURBINATE REDUCTION;   Surgeon: Margaretha Sheffield, MD;  Location: [facility] ORS;  Service: ENT;  Laterality: Bilateral;   toenail removal Bilateral    Patient Active Problem List   Diagnosis Date Noted   Pain in joint, ankle and foot 10/19/2021   Lymphedema 10/19/2021   Spinal stenosis of cervical region 08/26/2021   Bilateral hearing loss 04/10/2020   Difficulty walking 04/10/2020   Sensory ataxia 04/10/2020   B12 deficiency 02/03/2020   Leg pain, right 08/26/2019   Chronic venous insufficiency 08/26/2019   Venous insufficiency 08/26/2019   PAD (peripheral artery disease) ([place]) 08/14/2019   PVD (peripheral vascular disease) ([place]) 08/14/2019   Obstructive sleep apnea 05/06/2019   Obesity, Class II, BMI 35-39.9 03/11/2019   Type 2 diabetes mellitus with stage 3a chronic kidney disease, without long-term current use of insulin ([place]) 03/11/2019   Pain in joint involving ankle and foot 11/12/2018   Nuclear sclerotic cataract of both eyes 08/30/2018   Abnormal sensation in left ear 08/20/2018   Dizziness 08/20/2018   Numbness and tingling of left side of face 08/20/2018   Essential hypertension with goal blood pressure less than 130/80 03/19/2018   Healthcare maintenance 08/07/2017   Brown recluse spider bite 05/08/2017   SCC (squamous cell carcinoma), 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/2015   Severe obesity (BMI 35.0-35.9 with comorbidity) ([place]) 06/01/2015  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[place]) 09/29/2013   Hyperlipidemia associated with type 2 diabetes mellitus ([place]) 09/29/2013    PCP: Kirk Ruths, MD  REFERRING PROVIDER: Kirk Ruths, MD  REFERRING DIAGNOSIS: M25.511 (ICD-10-CM) - Pain in right shoulder W19.XXXA (ICD-10-CM) - Unspecified fall, initial encounter [phone] (ICD-10-CM) - Other muscle spasm M75.41 (ICD-10-CM) - Impingement syndrome of right shoulder   THERAPY DIAG: Right shoulder pain, unspecified chronicity  Dizziness and giddiness  RATIONALE FOR EVALUATION AND TREATMENT: Rehabilitation  ONSET DATE: 06/23/21  (approximate)  FOLLOW UP APPT WITH PROVIDER: Yes, returns in June 2023 to see [name]   From initial evaluation  SUBJECTIVE:                                                                                                                                                                                         Chief Complaint: R shoulder pain  Pertinent History Pt referred to PT for R shoulder pain by orthopedics. His pain began after a fall at the end of March 2023. He was walking down steps and started leaning forward. He responded by leaning back and he fell backwards bracing himself with his arms. He experienced immediate R shoulder pain. He denies any additional falls in the last 6 months but he did have a fall about 1 year ago. He had an evaluation of his R shoulder pain at his PCP office. Plain films of his thoracic spine and ribs were obtained which did not show any rib fractures but multilevel disc changes in his thoracic spine. He reports that prior to his fall he was receiving physical therapy for his cervical stenosis with notable improvement in his pain and mobility. He was last evaluated by physiatry on 08/26/2021 for his neck pain that radiates into his right arm. This was suspected to be due to his nerve root impingement seen on recent cervical spine MRI. There is also question of carpal tunnel syndrome. He was recommended EMG of the bilateral upper extremities and epidural steroid injection to his cervical spine. Pt states that [facility] PT told him there were "unable to see him" for his R shoulder pain. Since his fall he has had more pain along his posterior right shoulder. The pain is "under my shoulder blade" and it radiates around his right trunk. He points to his lateral R ribs, scapula, and periscapular area mostly.  He denies associated swelling, shoulder locking/catching, shoulder instability, weakness, fevers or chills, night sweats, weight loss, skin color change. He has  tried acetaminophen, [place] dream cream, activity modification, chronic medication nortriptyline/pregabalin/tramadol with continued symptoms. He is right hand dominant and is retired from being a Art gallery manager.  He also was previously in the [employer]   Pain:  Pain Intensity: Present: 0/10, Best: 0/10, Worst: 7/10 Pain location: R thoracic paraspinal/periscapular region Pain Quality: dull  Radiating: Yes, around the R ribs  Numbness/Tingling: No, He reports questionable minor numbness around R ribs Focal Weakness: No, not related to current R shoulder pain Aggravating factors: leaning to the left, standing forward from a chair, bending forward "makes me feel drunk," reaching out with RUE; Relieving factors: Tylenol, topical analgesic provides temporary relief, no help with ice/heat,  24-hour pain behavior: "All day long" History of prior shoulder or neck/shoulder injury, pain, surgery, or therapy: Yes, see history Falls: Has patient fallen in last 6 months? Yes, Number of falls: 1 Dominant hand: right Imaging: Yes, Right Shoulder Radiographs - 3 views (Grashey, Axillary, Scapular Y) performed 08/31/2021: Type I acromion. Mild AC joint degenerative change with osteophyte formation. The distal clavicle, scapula, and proximal humerus are intact. Glenohumeral and acromioclavicular joint alignment appears normal. No fractures or dislocations. Well maintained glenohumeral joint space without evidence of degenerative joint disease. No soft tissue swelling or joint effusion. No destructive bony lesion is identified.  Cervical spine MRI without contrast performed 04/15/2021 IMPRESSION: Cervical spondylosis, as outlined and with findings most notably as follows: At C5-C6, a moderate-sized central disc protrusion contributes to multifactorial severe spinal canal stenosis with moderate spinal cord flattening. No definite spinal cord signal abnormality is identified, although motion degradation limits evaluation.  Multifactorial bilateral neural foraminal narrowing (moderate right, severe left). No more than mild relative spinal canal narrowing at the remaining levels. Of note, a right center disc protrusion contacts the ventral spinal cord at C6-C7. Additional sites of foraminal stenosis, as detailed and greatest on the left at C3-C4 (moderate) and on the right at C6-C7 (moderate).    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  Recent memory is intact.  Remote memory is intact.  Attention span and concentration are intact.  Expressive speech is intact.  Patient's fund of knowledge is within normal limits for educational level. Conversation is tangential with frequent diversions     Gross Musculoskeletal Assessment Tremor: None Bulk: Normal Tone: Normal Overweight  Gait Full gait assessment deferred  Posture Forward head/rounded shoulders in sitting and standing   Cervical Screen Spurlings A (ipsilateral lateral flexion/axial compression): R: Positive for reproduction of periscapular pain L: Positive Spurlings B (ipsilateral lateral flexion/contralateral rotation/axial compression): R: Positive for reproduction of periscapular pain L: Positive Repeated movement: Not performed Hoffman Sign (cervical cord compression): R: Negative L: Negative ULTT Median: R: Not examined L: Not examined ULTT Ulnar: R: Not examined L: Not examined ULTT Radial: R: Not  examined L: Not examined   AROM  AROM (Normal range in degrees) AROM 10/21/2021  Cervical  Flexion (50) 43  Extension (80) 23*  Right lateral flexion (45) 35*  Left lateral flexion (45) 35  Right rotation (85)   Left rotation (85)    Right Left  Shoulder    Flexion    Extension  Abduction    External Rotation    Internal Rotation    Hands Behind Head    Hands Behind Back        Elbow    Flexion    Extension    Pronation    Supination    (* = pain; Blank rows = not tested)    LE MMT:  MMT (out of 5) Right 10/21/2021 Left 10/21/2021  Cervical (isometric)  Flexion Weak  Extension Weak  Lateral Flexion Weak Weak  Rotation Weak Weak      Shoulder   Flexion 4 4  Extension    Abduction 4+ 4  External rotation 3+* 3+  Internal rotation 5 5  Horizontal abduction    Horizontal adduction    Lower Trapezius    Rhomboids        Elbow  Flexion 5 5  Extension 5 5  Pronation    Supination        Wrist  Flexion 5 5  Extension 5 5  Radial deviation    Ulnar deviation        MCP  Flexion 5 5  Extension 5 5  Abduction    Adduction    (* = pain; Blank rows = not tested)  Positive reproduction of concordant pain with resisted L shoulder ER  Sensation Deferred  Reflexes Deferred   Palpation  Location LEFT  RIGHT           Subocciptials  0  Cervical paraspinals  2  Upper Trapezius    Levator Scapulae    Rhomboid Major/Minor  2  Sternoclavicular joint    Acromioclavicular joint    Coracoid process    Long head of biceps  2  Supraspinatus    Infraspinatus    Subscapularis    Teres Minor    Teres Major    Pectoralis Major    Pectoralis Minor    Anterior Deltoid  1  Lateral Deltoid    Posterior Deltoid    Latissimus Dorsi    Sternocleidomastoid    (Blank rows = not tested) Graded on 0-4 scale (0 = no pain, 1 = pain, 2 = pain with wincing/grimacing/flinching, 3 = pain with withdrawal, 4 = unwilling to allow palpation), (Blank rows = not  tested)   Repeated Movements Deferred  Passive Accessory Intervertebral Motion Deferred   Accessory Motions/Glides Deferred   SPECIAL TESTS Rotator Cuff  Drop Arm Test: Negative Painful Arc (Pain from 60 to 120 degrees scaption): Negative Infraspinatus Muscle Test: Positive   Subacromial Impingement Deferred  Labral Tear Deferred  Bicep Tendon Pathology Deferred  Shoulder Instability Deferred   Beighton scale: Deferred  FROM 09/23/21 VISIT:  AROM  AROM (Normal range in degrees) AROM 09/23/2021  Cervical  Flexion (50) 43  Extension (80) 23*  Right lateral flexion (45) 35*  Left lateral flexion (45) 35  Right rotation (85)   Left rotation (85)    Right Left  Shoulder    Flexion 160* 160*  Extension    Abduction 145* 120*  External Rotation 60* 55*  Internal Rotation 86 85  Hands Behind Head    Hands Behind Back        Elbow    Flexion WNL   Extension WNL   Pronation    Supination    (* = pain; Blank rows = not tested)  Passive Accessory Intervertebral Motion Pt denies reproduction of shoulder pain with CPA C2-T7 and R UPA C2-T7 however he is tender centrally to pressure.  Generally, hypomobile throughout however difficult to assess mobility secondary to pain;  Accessory Motions/Glides Glenohumeral: Posterior: R: Painful, unable to fully assess L: not examined Inferior: R: abnormal, hypomobile and painful L: not examined Anterior: R: normal L: not examined  Acromioclavicular:  Posterior: R: Too painful to assess fully  L: not examined Anterior: R: Too painful to assess fully L: not examined  Sternoclavicular: Posterior: R: Too painful to assess fully L: not examined Anterior: R: Too painful to assess fully L: not examined Superior: R: Too painful to assess fully L: not examined Inferior: R: Too painful to assess fully L: not examined  Scapulothoracic: Painful in all directions so unable to fully assess mobility;   Muscle Length  Testing Deferred   SPECIAL TESTS (all on right side)  Subacromial Impingement Hawkins-Kennedy: Positive Neer (Block scapula, PROM flexion): Positive Painful Arc (Pain from 60 to 120 degrees scaption): Negative Empty Can: Positive External Rotation Resistance: Positive Horizontal Adduction: Negative Scapular Assist: Not examined  Labral Tear Biceps Load II (120 elevation, full ER, 90 elbow flexion, full supination, resisted elbow flexion): Negative Crank (160 scaption, axial load with IR/ER): Positive Active Compression Test: Negative  Bicep Tendon Pathology Speed (shoulder flexion to 90, external rotation, full elbow extension, and forearm supination with resistance: Negative Yergason's (resisted shoulder ER and supination/biceps tendon pathology): Not examined  Shoulder Instability Sulcus Sign: Positive Anterior Apprehension: Negative    TODAY'S TREATMENT   SUBJECTIVE: Pt reports 7/10 resting R shoulder/flank pain upon arrival. He reports that he had some dizziness this morning when standing up after bending forward but denies any true vertigo. No specific questions currently.   PAIN: 7/10 R shoulder/flank   Ther-ex  NuStep (seat 9, arms 10) L1-3 BUE/BLE x 5 minutes for warm-up during interval history (2 minutes unbilled); Seated Nautilus lat pull down 70# 3 x 15; Seated Nautilus rows with 50# 3 x 15; Standing Nautilus R shoulder ER with 20# 2 x 15;  Updated outcome measures with patient: Worst pain: 7/10; FOTO: 53 QuickDASH: 45.5% R shoulder ER MMT: 4+/5 with minimal pain   Neuromuscular Re-education  Dix-Hallpike and Roll Tests are negative bilaterally for both vertigo and nystagmus;   Not performed today: Supine R shoulder flexion with 3# dumbbell (DB) 2 x 15; Supine R shoulder circles (CW and CCW) with 3# DB 2 x 15; Supine R shoulder serratus punch with 3# DB 2 x 15; L sidelying R shoulder abduction with 3# DB 2 x 15; L sidelying R shoulder ER with 3#  DB 2 x 15; Seated "W's" with green tband 2 x 15; Seated scapular retractions 2 x 15; Standing Nautilus chest press with 50# 3 x 10 Standing Nautilus shoulder extension 40# 3 x 10; Standing shoulder flexion and scaption with 3# dumbbells x 10 each (attempted pure abduction as well however this increases pain so discontinued); Seated overhead shoulder press with 3# dumbbells x 10;   PATIENT EDUCATION:  Education details: Pt educated throughout session about proper posture and technique with exercises. Improved exercise technique, movement at target joints, use of target muscles after min to mod verbal, visual, tactile cues. Education about different possible causes for patient's dizziness. Person educated: Patient Education method: Explanation, verbal cues, tactile cues, and handout Education comprehension: verbalized understanding,    HOME EXERCISE PROGRAM: Access Code: [email or web address] URL: [email or web address] Date: 09/23/2021 Prepared by: Roxana Hires  Exercises - Seated Scapular Retraction  - 1 x daily - 7 x weekly - 2 sets - 10 reps - 2s hold -  Standing Shoulder Row with Anchored Resistance  - 1 x daily - 7 x weekly - 2 sets - 10 reps - 2s hold - Shoulder W - External Rotation with Resistance  - 1 x daily - 7 x weekly - 2 sets - 10 reps - 2s hold   ASSESSMENT:  CLINICAL IMPRESSION: Due to report of dizziness this morning repeated BPPV testing which is all negative. Outcome measures and goals updated with patient. His QuickDASH is slightly worse today however his FOTO improved. His R shoulder ER strength has also improved considerably and is less painful with resistance. He continues to report that his worst pain will get up to 7/10. Overall he reports approximately 30% improvement in his symptoms since starting with therapy. Continued with pain-free strengthening for the rest of session today. Utilized Surveyor, minerals and was able to increase repetition count today. Pt  encouraged to continue HEP and follow-up as scheduled. He will continue to benefit from PT services to address deficits in strength, pain, and range of motion in order to return to full function at home.    REHAB POTENTIAL: Fair secondary to multiple comorbidities and ongoing chronic neck pain  CLINICAL DECISION MAKING: Unstable/unpredictable  EVALUATION COMPLEXITY: High   GOALS: Goals reviewed with patient? Yes  SHORT TERM GOALS: Target date: 10/14/2021   Pt will be independent with HEP to improve strength and decrease shoulder pain to improve pain-free function at home and work. Baseline:  Goal status: ONGOING   LONG TERM GOALS: Target date: 11/10/2021   Pt will increase FOTO to at least 62 to demonstrate significant improvement in function at home and work related to neck pain  Baseline: 09/15/21: 50; 10/21/21: 53 Goal status: PARTIALLY MET  2.  Pt will decrease worst shoulder pain by at least 3 points on the NPRS in order to demonstrate clinically significant reduction in shoulder pain. Baseline: 09/15/21: worst: 7/10; 10/21/21: worst: 7/10; Goal status: ONGOING  3.  Pt will decrease quick DASH score by at least 8% in order to demonstrate clinically significant reduction in disability related to shoulder pain        Baseline: 09/15/21: 37.5%, 10/21/21: 45.5%  Goal status: ONGOING  4. Pt will increase strength of pain-free R shoulder external rotation to at least 4/5 in order to demonstrate improvement in strength and function of RUE       Baseline: 09/15/21: 3+/5 painful, 10/21/21: 4+/5 with minimal pain Goal status: PARTIALLY MET   PLAN: PT FREQUENCY: 1-2x/week  PT DURATION: 8 weeks  PLANNED INTERVENTIONS: Therapeutic exercises, Therapeutic activity, Neuromuscular re-education, Balance training, Gait training, Patient/Family education, Joint manipulation, Joint mobilization, Vestibular training, Canalith repositioning, Aquatic Therapy, Dry Needling, Electrical stimulation,  Spinal manipulation, Spinal mobilization, Cryotherapy, Moist heat, Traction, Ultrasound, Ionotophoresis 68m/ml Dexamethasone, and Manual therapy  PLAN FOR NEXT SESSION: Re-test for BPPV as indicated (currently resolved), continue manual techniques and strengthening (primary focus), review and modify HEP as needed  JLyndel SafeHuprich PT, DPT, GCS  Clotilde Loth 10/21/2021, 1:24 PM

## 2021-10-28 ENCOUNTER — Encounter: Payer: Self-pay | Admitting: Neurosurgery

## 2021-10-28 ENCOUNTER — Ambulatory Visit (INDEPENDENT_AMBULATORY_CARE_PROVIDER_SITE_OTHER): Payer: Medicare Other | Admitting: Neurosurgery

## 2021-10-28 VITALS — BP 148/86 | HR 85 | Ht 67.0 in | Wt 236.4 lb

## 2021-10-28 DIAGNOSIS — M4712 Other spondylosis with myelopathy, cervical region: Secondary | ICD-10-CM | POA: Diagnosis not present

## 2021-10-28 DIAGNOSIS — M4802 Spinal stenosis, cervical region: Secondary | ICD-10-CM | POA: Diagnosis not present

## 2021-10-28 NOTE — Progress Notes (Signed)
Progress Note: Referring Physician:  Kirk Ruths, MD [facility] [place],  [place] [id number]  Primary Physician:  Kirk Ruths, MD  Chief Complaint:  3 month f/u of cervical myelopathy   History of Present Illness: Bobby Chen is a 76 y.o. male who presents for 3 month follow up of cervical stenosis and watch for symptoms of cervical myelopathy.  He was seen in early April and was doing fairly well at that time however he suffered a fall shortly after and has had increased right scapular pain worse with reaching forward and overhead he describes as sharp in nature.  In by his PCP and x-rays were obtained which were largely normal however his symptoms have persisted.  He also states that he has had more difficulty getting up from a seated position since this fall.  He states that he has had an increased number of falls over the last year but attributes this to his blood pressure and a recent diagnosis of vertigo.  He is currently scheduled to start physical therapy next week.  He denies any new or worsening radicular arm symptoms.  He does have a history of carpal tunnel and has constant numbness and tingling in his hands although states this has worsened since his fall in April.  LOV:  07/27/21 Bobby Chen is a 76 y.o. male with known cervical stenosis and neck who presents for 6 week follow up after PT. he reports increased range of motion in his neck however he does continue to have some right-sided neck pain and interscapular pain when rotating his head to the right. Physical therapy is working on this. He denies any radiating arm symptoms, numbness or tingling into his hands, changes in dexterity, or weakness.  HPI by Dr. Izora Ribas: 05/31/2021 Mr. Bobby Chen is here today with a chief complaint of neck pain/stiffness and limited ROM. He also reports pain that radiates down the middle of his upper back and into his ears bilaterally. He denies  numbness, tingling, or trouble with dexterity. He reports he has had balance issues since breaking his right ankle in 1984.   He has been having issues since August 2022. He has pain towards the top of his neck and has difficulty with rotating to the left.    Bowel/Bladder Dysfunction: none   Conservative measures:  Physical therapy: has not participated Multimodal medical therapy including regular antiinflammatories:  Robaxin, Lyrica, Ultram Injections: has not received epidural steroid injections   Past Surgery: none   Bobby Chen has no symptoms of cervical myelopathy.   The symptoms are causing a significant impact on the patient's life.    HPI by [name], PA: 04/28/2021: Telephone call: "I spoke with [name] at length regarding his MRI results and concern for stenosis at C5-6. His primary complaint continues to be stiffness and neck pain with limited ROM to the left. He continues to be without any numbness, tingling, imbalance, or trouble with dexterity.  We did discuss further options regarding his cervical stenosis and I expressed that this was unlikely the cause of his neck pain but could manifest as myelopathy in the future. We discussed the symptoms of cervical myelopathy.  We discussed treatment options such as PT which his insurance still will not cover according to him, and injections which he is not interested in. He would like to meet with Dr. Izora Ribas about his cervical stenosis and discuss surgical options.  He was encouraged  to call the office in the interim with any questions or concerns." 04/01/2021 Mr. Bobby Chen is a 76 y.o with a history of diabetes, hypertension, chronic pain, here today at the request of ENT for further evaluation of his neck.  He states that he has had neck pain that radiates down the middle of his upper back and into his ears bilaterally since August of this year without any inciting event.  He was recently seen by ENT who referred him to  neurosurgery for further evaluation of "pitched nerves" in his cervical spine.  He denies any radiating arm pain.  He states that turning his head from side to side particularly when driving is more difficult and will occasionally cause the pain in his neck.  He also endorses some pain with looking up and down.  His primary complaint however is dizziness which seems to occur somewhat reasonably as well as ringing in his ears.  He underwent physical therapy for his right lower extremity and was referred to physical therapy for his neck and for gait training however his insurance not cover anymore sessions this year.  He denies any upper extremity weakness.  Of note he endorses several episodes of dried blood in his left ear.  He denies any trauma or using any penetrating objects.  Exam: Vitals:   10/28/21 1331  BP: (!) 148/86  Pulse: 85    NEUROLOGICAL:  General: In no acute distress.   Awake, alert, oriented to person, place, and time.  Pupils equal round and reactive to light.  Facial tone is symmetric.  Tongue protrusion is midline.  There is no pronator drift.  ROM of spine: full.  Palpation of spine: nontender.    Strength: Side Biceps Triceps Deltoid Interossei Grip Wrist Ext. Wrist Flex.  R '5 5 5 5 5 5 5  '$ L '5 5 5 5 5 5 5    '$ MSK: Pain with abduction of the right shoulder  Imaging: 04/15/21 MRI C spine IMPRESSION: Cervical spondylosis, as outlined and with findings most notably as follows.   At C5-C6, a moderate-sized central disc protrusion contributes to multifactorial severe spinal canal stenosis with moderate spinal cord flattening. No definite spinal cord signal abnormality is identified, although motion degradation limits evaluation. Multifactorial bilateral neural foraminal narrowing (moderate right, severe left).   No more than mild relative spinal canal narrowing at the remaining levels. Of note, a right center disc protrusion contacts the ventral spinal cord at  C6-C7.   Additional sites of foraminal stenosis, as detailed and greatest on the left at C3-C4 (moderate) and on the right at C6-C7 (moderate).     Electronically Signed   By: Kellie Simmering D.O.   On: 04/15/2021 12:34  I have personally reviewed the images and agree with the above interpretation.  Assessment and Plan: Mr. Potash is a pleasant 76 y.o. male with known cervical stenosis.  He states that his neck pain is significantly better since physical therapy and does have some painless cracking in his neck however his primary concern is right scapular pain since a fall in April which has not improved since.  He is seen his PCP with largely normal xrays at that time.  I encouraged him to continue with physical therapy as planned.  I will reach out to [name] and expressed my concern as the patient may need updated x-rays.  I do not think that his current scapular pain is related to his cervical spine.  I am however concerned that  given his fall and decreased mobility that there has been changes in his cervical spine and would like to update a cervical MRI.  I will place an order for this.  I would like for him to follow-up with Dr. Izora Ribas after his MRI.  While his falls do not sound like they are a result of progressive myelopathy I am concerned that given his history of falls that he could be at risk for a spinal cord injury given his cervical stenosis and this may be a reason to consider surgical intervention.  He was encouraged to call our office in the interim should he have any questions or concerns.  He expressed understanding was in agreement with this plan.  I spent a total of 34 minutes in both face-to-face and non-face-to-face activities for this visit on the date of this encounter.  This note was generated in part with voice recognition software and I apologize for any typographical errors that were not detected and corrected.  [name] PA-C Neurosurgery

## 2021-10-29 NOTE — Therapy (Signed)
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[place])    Elevated lipids    Hypertension    Obesity    Pulmonary arterial hypertension ([place])    Sleep apnea    Past Surgical History:  Procedure Laterality Date   ANKLE FRACTURE SURGERY     BASAL CELL CARCINOMA EXCISION N/A    scalp   COLONOSCOPY W/ POLYPECTOMY     COLONOSCOPY WITH PROPOFOL N/A 01/18/2016   Procedure: COLONOSCOPY WITH PROPOFOL;  Surgeon: Lollie Sails, MD;  Location: [facility] ENDOSCOPY;  Service: Endoscopy;  Laterality: N/A;   COLONOSCOPY WITH PROPOFOL N/A 07/02/2018   Procedure: COLONOSCOPY WITH PROPOFOL;  Surgeon: Lollie Sails, MD;  Location: [facility] ENDOSCOPY;  Service: Endoscopy;  Laterality: N/A;   ESOPHAGOGASTRODUODENOSCOPY (EGD) WITH PROPOFOL N/A 01/18/2016   Procedure: ESOPHAGOGASTRODUODENOSCOPY (EGD) WITH PROPOFOL;  Surgeon: Lollie Sails, MD;  Location: [facility] ENDOSCOPY;  Service: Endoscopy;  Laterality: N/A;   FRACTURE SURGERY Right 1984   ankle   HERNIA REPAIR     UMBILICAL   LEG SURGERY Left    NASAL SEPTOPLASTY W/ TURBINOPLASTY Bilateral 11/06/2019   Procedure: NASAL SEPTOPLASTY WITH INFERIOR TURBINATE REDUCTION;  Surgeon: Margaretha Sheffield, MD;  Location: [facility] ORS;  Service: ENT;  Laterality: Bilateral;   toenail removal Bilateral    Patient Active Problem List   Diagnosis Date Noted   Pain in joint, ankle and foot 10/19/2021   Lymphedema 10/19/2021   Spinal stenosis of cervical region 08/26/2021   Bilateral hearing loss 04/10/2020   Difficulty walking 04/10/2020   Sensory ataxia 04/10/2020   B12 deficiency 02/03/2020   Leg  pain, right 08/26/2019   Chronic venous insufficiency 08/26/2019   Venous insufficiency 08/26/2019   PAD (peripheral artery disease) ([place]) 08/14/2019   PVD (peripheral vascular disease) ([place]) 08/14/2019   Obstructive sleep apnea 05/06/2019   Obesity, Class II, BMI 35-39.9 03/11/2019   Type 2 diabetes mellitus with stage 3a chronic kidney disease, without long-term current use of insulin ([place]) 03/11/2019   Pain in joint involving ankle and foot 11/12/2018   Nuclear sclerotic cataract of both eyes 08/30/2018   Abnormal sensation in left ear 08/20/2018   Dizziness 08/20/2018   Numbness and tingling of left side of face 08/20/2018   Essential hypertension with goal blood pressure less than 130/80 03/19/2018   Healthcare maintenance 08/07/2017   Brown recluse spider bite 05/08/2017   SCC (squamous cell carcinoma), 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/2015   Severe obesity (BMI 35.0-35.9 with comorbidity) ([place]) 06/01/2015   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[place]) 09/29/2013   Hyperlipidemia associated with type 2 diabetes mellitus ([place]) 09/29/2013    PCP: Kirk Ruths, MD  REFERRING PROVIDER: Diamond Nickel, DO  REFERRING DIAGNOSIS: M25.511 (ICD-10-CM) - Pain in right shoulder W19.XXXA (ICD-10-CM) - Unspecified fall, initial encounter M62.838 (ICD-10-CM) - Other muscle spasm M75.41 (ICD-10-CM) - Impingement syndrome of right shoulder   THERAPY DIAG: Right shoulder pain, unspecified chronicity  Dizziness and giddiness  RATIONALE FOR EVALUATION AND TREATMENT: Rehabilitation  ONSET DATE: 06/23/21 (approximate)  FOLLOW UP APPT WITH PROVIDER: Yes, returns in June 2023 to see Dr.  Anderson   From initial evaluation  SUBJECTIVE:                                                                                                                                                                                         Chief Complaint: R shoulder pain  Pertinent History Pt referred to PT for R shoulder pain by  orthopedics. His pain began after a fall at the end of March 2023. He was walking down steps and started leaning forward. He responded by leaning back and he fell backwards bracing himself with his arms. He experienced immediate R shoulder pain. He denies any additional falls in the last 6 months but he did have a fall about 1 year ago. He had an evaluation of his R shoulder pain at his PCP office. Plain films of his thoracic spine and ribs were obtained which did not show any rib fractures but multilevel disc changes in his thoracic spine. He reports that prior to his fall he was receiving physical therapy for his cervical stenosis with notable improvement in his pain and mobility. He was last evaluated by physiatry on 08/26/2021 for his neck pain that radiates into his right arm. This was suspected to be due to his nerve root impingement seen on recent cervical spine MRI. There is also question of carpal tunnel syndrome. He was recommended EMG of the bilateral upper extremities and epidural steroid injection to his cervical spine. Pt states that [facility] PT told him there were "unable to see him" for his R shoulder pain. Since his fall he has had more pain along his posterior right shoulder. The pain is "under my shoulder blade" and it radiates around his right trunk. He points to his lateral R ribs, scapula, and periscapular area mostly.  He denies associated swelling, shoulder locking/catching, shoulder instability, weakness, fevers or chills, night sweats, weight loss, skin color change. He has tried acetaminophen, [place] dream cream, activity modification, chronic medication nortriptyline/pregabalin/tramadol with continued symptoms. He is right hand dominant and is retired from being a Art gallery manager. He also was previously in the [employer]   Pain:  Pain Intensity: Present: 0/10, Best: 0/10, Worst: 7/10 Pain location: R thoracic paraspinal/periscapular region Pain Quality: dull  Radiating: Yes, around  the R ribs  Numbness/Tingling: No, He reports questionable minor numbness around R ribs Focal Weakness: No, not related to current R shoulder pain Aggravating factors: leaning to the left, standing forward from a chair, bending forward "makes me feel drunk," reaching out with RUE; Relieving factors: Tylenol, topical analgesic provides temporary relief, no help with ice/heat,  24-hour pain behavior: "All day long" History of prior shoulder  or neck/shoulder injury, pain, surgery, or therapy: Yes, see history Falls: Has patient fallen in last 6 months? Yes, Number of falls: 1 Dominant hand: right Imaging: Yes, Right Shoulder Radiographs - 3 views (Grashey, Axillary, Scapular Y) performed 08/31/2021: Type I acromion. Mild AC joint degenerative change with osteophyte formation. The distal clavicle, scapula, and proximal humerus are intact. Glenohumeral and acromioclavicular joint alignment appears normal. No fractures or dislocations. Well maintained glenohumeral joint space without evidence of degenerative joint disease. No soft tissue swelling or joint effusion. No destructive bony lesion is identified.  Cervical spine MRI without contrast performed 04/15/2021 IMPRESSION: Cervical spondylosis, as outlined and with findings most notably as follows: At C5-C6, a moderate-sized central disc protrusion contributes to multifactorial severe spinal canal stenosis with moderate spinal cord flattening. No definite spinal cord signal abnormality is identified, although motion degradation limits evaluation. Multifactorial bilateral neural foraminal narrowing (moderate right, severe left). No more than mild relative spinal canal narrowing at the remaining levels. Of note, a right center disc protrusion contacts the ventral spinal cord at C6-C7. Additional sites of foraminal stenosis, as detailed and greatest on the left at C3-C4 (moderate) and on the right at C6-C7 (moderate).    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  Recent memory is intact.  Remote memory is intact.  Attention span and concentration are intact.  Expressive speech is intact.  Patient's fund of knowledge is within normal limits for educational level. Conversation is tangential with frequent diversions     Gross Musculoskeletal Assessment Tremor: None Bulk: Normal Tone: Normal Overweight  Gait Full gait assessment deferred  Posture Forward head/rounded shoulders in sitting and standing   Cervical Screen Spurlings A (ipsilateral lateral flexion/axial compression): R: Positive for reproduction of periscapular pain L: Positive Spurlings B (ipsilateral lateral flexion/contralateral rotation/axial compression): R: Positive for reproduction of periscapular pain L: Positive Repeated movement: Not performed Hoffman Sign (cervical cord compression): R: Negative L: Negative ULTT Median: R: Not examined L: Not examined ULTT Ulnar: R: Not examined L: Not examined ULTT Radial: R: Not examined L: Not examined   AROM  AROM (Normal range in degrees) AROM 10/29/2021  Cervical  Flexion (50) 43  Extension (80) 23*  Right lateral flexion (45) 35*  Left lateral flexion (45) 35  Right rotation (85)   Left rotation (85)    Right Left  Shoulder    Flexion    Extension    Abduction    External Rotation    Internal Rotation    Hands Behind Head    Hands Behind Back         Elbow    Flexion    Extension    Pronation    Supination    (* = pain; Blank rows = not tested)    LE MMT:  MMT (out of 5) Right 10/29/2021 Left 10/29/2021  Cervical (isometric)  Flexion Weak  Extension Weak  Lateral Flexion Weak Weak  Rotation Weak Weak      Shoulder  Flexion 4 4  Extension    Abduction 4+ 4  External rotation 3+* 3+  Internal rotation 5 5  Horizontal abduction    Horizontal adduction    Lower Trapezius    Rhomboids        Elbow  Flexion 5 5  Extension 5 5  Pronation    Supination        Wrist  Flexion 5 5  Extension 5 5  Radial deviation    Ulnar deviation        MCP  Flexion 5 5  Extension 5 5  Abduction    Adduction    (* = pain; Blank rows = not tested)  Positive reproduction of concordant pain with resisted L shoulder ER  Sensation Deferred  Reflexes Deferred   Palpation  Location LEFT  RIGHT           Subocciptials  0  Cervical paraspinals  2  Upper Trapezius    Levator Scapulae    Rhomboid Major/Minor  2  Sternoclavicular joint    Acromioclavicular joint    Coracoid process    Long head of biceps  2  Supraspinatus    Infraspinatus    Subscapularis    Teres Minor    Teres Major    Pectoralis Major    Pectoralis Minor    Anterior Deltoid  1  Lateral Deltoid    Posterior Deltoid    Latissimus Dorsi    Sternocleidomastoid    (Blank rows = not tested) Graded on 0-4 scale (0 = no pain, 1 = pain, 2 = pain with wincing/grimacing/flinching, 3 = pain with withdrawal, 4 = unwilling to allow palpation), (Blank rows = not tested)   Repeated Movements Deferred  Passive Accessory Intervertebral Motion Deferred   Accessory Motions/Glides Deferred   SPECIAL TESTS Rotator Cuff  Drop Arm Test: Negative Painful Arc (Pain from 60 to 120 degrees scaption): Negative Infraspinatus Muscle Test: Positive   Subacromial Impingement Deferred  Labral Tear Deferred  Bicep Tendon Pathology Deferred  Shoulder  Instability Deferred   Beighton scale: Deferred  FROM 09/23/21 VISIT:  AROM  AROM (Normal range in degrees) AROM 09/23/2021  Cervical  Flexion (50) 43  Extension (80) 23*  Right lateral flexion (45) 35*  Left lateral flexion (45) 35  Right rotation (85)   Left rotation (85)    Right Left  Shoulder    Flexion 160* 160*  Extension    Abduction 145* 120*  External Rotation 60* 55*  Internal Rotation 86 85  Hands Behind Head    Hands Behind Back        Elbow    Flexion WNL   Extension WNL   Pronation    Supination    (* = pain; Blank rows = not tested)  Passive Accessory Intervertebral Motion Pt denies reproduction of shoulder pain with CPA C2-T7 and R UPA C2-T7 however he is tender centrally to pressure. Generally, hypomobile throughout however difficult to assess mobility secondary to pain;  Accessory Motions/Glides Glenohumeral: Posterior: R: Painful, unable to fully assess L: not examined Inferior: R: abnormal, hypomobile and painful L: not examined Anterior: R: normal L: not examined  Acromioclavicular:  Posterior: R: Too painful to assess fully  L: not examined Anterior: R: Too painful to assess fully L: not examined  Sternoclavicular: Posterior: R: Too painful to assess fully L: not examined Anterior: R: Too painful to assess fully L: not examined Superior: R: Too painful to assess fully L: not examined Inferior: R:  Too painful to assess fully L: not examined  Scapulothoracic: Painful in all directions so unable to fully assess mobility;   Muscle Length Testing Deferred   SPECIAL TESTS (all on right side)  Subacromial Impingement Hawkins-Kennedy: Positive Neer (Block scapula, PROM flexion): Positive Painful Arc (Pain from 60 to 120 degrees scaption): Negative Empty Can: Positive External Rotation Resistance: Positive Horizontal Adduction: Negative Scapular Assist: Not examined  Labral Tear Biceps Load II (120 elevation, full ER, 90 elbow  flexion, full supination, resisted elbow flexion): Negative Crank (160 scaption, axial load with IR/ER): Positive Active Compression Test: Negative  Bicep Tendon Pathology Speed (shoulder flexion to 90, external rotation, full elbow extension, and forearm supination with resistance: Negative Yergason's (resisted shoulder ER and supination/biceps tendon pathology): Not examined  Shoulder Instability Sulcus Sign: Positive Anterior Apprehension: Negative    TODAY'S TREATMENT   SUBJECTIVE: Pt reports 7/10 resting R shoulder/flank pain upon arrival. He reports that he had some dizziness this morning when standing up after bending forward but denies any true vertigo. No specific questions currently.   PAIN: 7/10 R shoulder/flank   Ther-ex  NuStep (seat 9, arms 10) L1-3 BUE/BLE x 5 minutes for warm-up during interval history (2 minutes unbilled); Seated Nautilus lat pull down 70# 3 x 15; Seated Nautilus rows with 50# 3 x 15; Standing Nautilus R shoulder ER with 20# 2 x 15;  Updated outcome measures with patient: Worst pain: 7/10; FOTO: 53 QuickDASH: 45.5% R shoulder ER MMT: 4+/5 with minimal pain   Neuromuscular Re-education  Dix-Hallpike and Roll Tests are negative bilaterally for both vertigo and nystagmus;   Not performed today: Supine R shoulder flexion with 3# dumbbell (DB) 2 x 15; Supine R shoulder circles (CW and CCW) with 3# DB 2 x 15; Supine R shoulder serratus punch with 3# DB 2 x 15; L sidelying R shoulder abduction with 3# DB 2 x 15; L sidelying R shoulder ER with 3# DB 2 x 15; Seated "W's" with green tband 2 x 15; Seated scapular retractions 2 x 15; Standing Nautilus chest press with 50# 3 x 10 Standing Nautilus shoulder extension 40# 3 x 10; Standing shoulder flexion and scaption with 3# dumbbells x 10 each (attempted pure abduction as well however this increases pain so discontinued); Seated overhead shoulder press with 3# dumbbells x 10;   PATIENT  EDUCATION:  Education details: Pt educated throughout session about proper posture and technique with exercises. Improved exercise technique, movement at target joints, use of target muscles after min to mod verbal, visual, tactile cues. Education about different possible causes for patient's dizziness. Person educated: Patient Education method: Explanation, verbal cues, tactile cues, and handout Education comprehension: verbalized understanding,    HOME EXERCISE PROGRAM: Access Code: [email or web address] URL: [email or web address] Date: 09/23/2021 Prepared by: Roxana Hires  Exercises - Seated Scapular Retraction  - 1 x daily - 7 x weekly - 2 sets - 10 reps - 2s hold - Standing Shoulder Row with Anchored Resistance  - 1 x daily - 7 x weekly - 2 sets - 10 reps - 2s hold - Shoulder W - External Rotation with Resistance  - 1 x daily - 7 x weekly - 2 sets - 10 reps - 2s hold   ASSESSMENT:  CLINICAL IMPRESSION: Due to report of dizziness this morning repeated BPPV testing which is all negative. Outcome measures and goals updated with patient. His QuickDASH is slightly worse today however his FOTO improved. His R shoulder ER strength has also improved considerably and is  less painful with resistance. He continues to report that his worst pain will get up to 7/10. Overall he reports approximately 30% improvement in his symptoms since starting with therapy. Continued with pain-free strengthening for the rest of session today. Utilized Surveyor, minerals and was able to increase repetition count today. Pt encouraged to continue HEP and follow-up as scheduled. He will continue to benefit from PT services to address deficits in strength, pain, and range of motion in order to return to full function at home.    REHAB POTENTIAL: Fair secondary to multiple comorbidities and ongoing chronic neck pain  CLINICAL DECISION MAKING: Unstable/unpredictable  EVALUATION COMPLEXITY: High   GOALS: Goals  reviewed with patient? Yes  SHORT TERM GOALS: Target date: 10/14/2021   Pt will be independent with HEP to improve strength and decrease shoulder pain to improve pain-free function at home and work. Baseline:  Goal status: ONGOING   LONG TERM GOALS: Target date: 11/10/2021   Pt will increase FOTO to at least 62 to demonstrate significant improvement in function at home and work related to neck pain  Baseline: 09/15/21: 50; 10/21/21: 53 Goal status: PARTIALLY MET  2.  Pt will decrease worst shoulder pain by at least 3 points on the NPRS in order to demonstrate clinically significant reduction in shoulder pain. Baseline: 09/15/21: worst: 7/10; 10/21/21: worst: 7/10; Goal status: ONGOING  3.  Pt will decrease quick DASH score by at least 8% in order to demonstrate clinically significant reduction in disability related to shoulder pain        Baseline: 09/15/21: 37.5%, 10/21/21: 45.5%  Goal status: ONGOING  4. Pt will increase strength of pain-free R shoulder external rotation to at least 4/5 in order to demonstrate improvement in strength and function of RUE       Baseline: 09/15/21: 3+/5 painful, 10/21/21: 4+/5 with minimal pain Goal status: PARTIALLY MET   PLAN: PT FREQUENCY: 1-2x/week  PT DURATION: 8 weeks  PLANNED INTERVENTIONS: Therapeutic exercises, Therapeutic activity, Neuromuscular re-education, Balance training, Gait training, Patient/Family education, Joint manipulation, Joint mobilization, Vestibular training, Canalith repositioning, Aquatic Therapy, Dry Needling, Electrical stimulation, Spinal manipulation, Spinal mobilization, Cryotherapy, Moist heat, Traction, Ultrasound, Ionotophoresis 1m/ml Dexamethasone, and Manual therapy  PLAN FOR NEXT SESSION: Re-test for BPPV as indicated (currently resolved), continue manual techniques and strengthening (primary focus), review and modify HEP as needed  JLyndel SafeHuprich PT, DPT, GCS  Timiyah Romito 10/29/2021, 2:48 PM

## 2021-11-01 ENCOUNTER — Ambulatory Visit: Payer: Medicare Other | Attending: Sports Medicine

## 2021-11-01 ENCOUNTER — Telehealth: Payer: Self-pay

## 2021-11-01 DIAGNOSIS — R42 Dizziness and giddiness: Secondary | ICD-10-CM | POA: Insufficient documentation

## 2021-11-01 DIAGNOSIS — M25511 Pain in right shoulder: Secondary | ICD-10-CM | POA: Diagnosis not present

## 2021-11-01 NOTE — Telephone Encounter (Signed)
-----   Message from [facility] sent at 11/01/2021  8:04 AM EDT ----- Regarding: kidney pain Contact: [phone] Patient left v/m this morning at 1:30am, that he was having kidney pain, his BP was running high around 160s and his blood sugar was running high too. I called patient back and he said these symptoms started on Saturday. He wanted to see Danielle. I told him that she was in surgery today and it would be best for him to see his PCP first.

## 2021-11-01 NOTE — Telephone Encounter (Signed)
Yes these are things he should contact his PCP about.

## 2021-11-01 NOTE — Telephone Encounter (Signed)
Noted  

## 2021-11-01 NOTE — Therapy (Signed)
OUTPATIENT PHYSICAL THERAPY SHOULDER TREATMENT    Patient Name: Bobby Chen MRN: [id number] DOB:[birth year], 76 y.o., male Today's Date: 11/03/2021   PT End of Session - 11/03/21 1315     Visit Number 12    Number of Visits 17    Date for PT Re-Evaluation 11/10/21    Authorization Type eval: 09/15/21    PT Start Time 1316    PT Stop Time 1400    PT Time Calculation (min) 44 min    Activity Tolerance Patient tolerated treatment well    Behavior During Therapy [facility] for tasks assessed/performed              Past Medical History:  Diagnosis Date   Anginal pain ([place])    Brown recluse spider bite    Cancer ([place])    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, LEFT ARM    COPD (chronic obstructive pulmonary disease) (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[place])    Elevated lipids    Hypertension    Obesity    Pulmonary arterial hypertension ([place])    Sleep apnea    Past Surgical History:  Procedure Laterality Date   ANKLE FRACTURE SURGERY     BASAL CELL CARCINOMA EXCISION N/A    scalp   COLONOSCOPY W/ POLYPECTOMY     COLONOSCOPY WITH PROPOFOL N/A 01/18/2016   Procedure: COLONOSCOPY WITH PROPOFOL;  Surgeon: Lollie Sails, MD;  Location: [facility] ENDOSCOPY;  Service: Endoscopy;  Laterality: N/A;   COLONOSCOPY WITH PROPOFOL N/A 07/02/2018   Procedure: COLONOSCOPY WITH PROPOFOL;  Surgeon: Lollie Sails, MD;  Location: [facility] ENDOSCOPY;  Service: Endoscopy;  Laterality: N/A;   ESOPHAGOGASTRODUODENOSCOPY (EGD) WITH PROPOFOL N/A 01/18/2016   Procedure: ESOPHAGOGASTRODUODENOSCOPY (EGD) WITH PROPOFOL;  Surgeon: Lollie Sails, MD;  Location: [facility] ENDOSCOPY;  Service: Endoscopy;  Laterality: N/A;   FRACTURE SURGERY Right 1984   ankle   HERNIA REPAIR     UMBILICAL   LEG SURGERY Left    NASAL SEPTOPLASTY W/ TURBINOPLASTY Bilateral 11/06/2019   Procedure: NASAL SEPTOPLASTY WITH INFERIOR TURBINATE REDUCTION;  Surgeon: Margaretha Sheffield, MD;  Location: [facility] ORS;  Service:  ENT;  Laterality: Bilateral;   toenail removal Bilateral    Patient Active Problem List   Diagnosis Date Noted   Pain in joint, ankle and foot 10/19/2021   Lymphedema 10/19/2021   Spinal stenosis of cervical region 08/26/2021   Bilateral hearing loss 04/10/2020   Difficulty walking 04/10/2020   Sensory ataxia 04/10/2020   B12 deficiency 02/03/2020   Leg pain, right 08/26/2019   Chronic venous insufficiency 08/26/2019   Venous insufficiency 08/26/2019   PAD (peripheral artery disease) ([place]) 08/14/2019   PVD (peripheral vascular disease) ([place]) 08/14/2019   Obstructive sleep apnea 05/06/2019   Obesity, Class II, BMI 35-39.9 03/11/2019   Type 2 diabetes mellitus with stage 3a chronic kidney disease, without long-term current use of insulin ([place]) 03/11/2019   Pain in joint involving ankle and foot 11/12/2018   Nuclear sclerotic cataract of both eyes 08/30/2018   Abnormal sensation in left ear 08/20/2018   Dizziness 08/20/2018   Numbness and tingling of left side of face 08/20/2018   Essential hypertension with goal blood pressure less than 130/80 03/19/2018   Healthcare maintenance 08/07/2017   Brown recluse spider bite 05/08/2017   SCC (squamous cell carcinoma), 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/2015   Severe obesity (BMI 35.0-35.9 with comorbidity) ([employer]) 06/01/2015   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[place]) 09/29/2013   Hyperlipidemia associated with type 2 diabetes mellitus (  [place]) 09/29/2013    PCP: Kirk Ruths, MD  REFERRING PROVIDER: Diamond Nickel, DO  REFERRING DIAGNOSIS: M25.511 (ICD-10-CM) - Pain in right shoulder W19.XXXA (ICD-10-CM) - Unspecified fall, initial encounter [phone] (ICD-10-CM) - Other muscle spasm M75.41 (ICD-10-CM) - Impingement syndrome of right shoulder   THERAPY DIAG: Right shoulder pain, unspecified chronicity  RATIONALE FOR EVALUATION AND TREATMENT: Rehabilitation  ONSET DATE: 06/23/21 (approximate)  FOLLOW UP APPT WITH PROVIDER: Yes, returns in June 2023 to see Dr.  [name]   From initial evaluation  SUBJECTIVE:                                                                                                                                                                                         Chief Complaint: R shoulder pain  Pertinent History Pt referred to PT for R shoulder pain by orthopedics. His pain began after a fall at the end of March 2023. He was walking down steps and started leaning forward. He responded by leaning back and he fell backwards bracing himself with his arms. He experienced immediate R shoulder pain. He denies any additional falls in the last 6 months but he did have a fall about 1 year ago. He had an evaluation of his R shoulder pain at his PCP office. Plain films of his thoracic spine and ribs were obtained which did not show any rib fractures but multilevel disc changes in his thoracic spine. He reports that prior to his fall he was receiving physical therapy for his cervical stenosis with notable improvement in his pain and mobility. He was last evaluated by physiatry on 08/26/2021 for his neck pain that radiates into his right arm. This was suspected to be due to his nerve root impingement seen on recent cervical spine MRI. There is also question of carpal tunnel syndrome. He was recommended EMG of the bilateral upper extremities and epidural steroid injection to his cervical spine. Pt states that [facility] PT told him there were "unable to see him" for his R shoulder pain. Since his fall he has had more pain along his posterior right shoulder. The pain is "under my shoulder blade" and it radiates around his right trunk. He points to his lateral R ribs, scapula, and periscapular area mostly.  He denies associated swelling, shoulder locking/catching, shoulder instability, weakness, fevers or chills, night sweats, weight loss, skin color change. He has tried acetaminophen, [place] dream cream, activity modification, chronic  medication nortriptyline/pregabalin/tramadol with continued symptoms. He is right hand dominant and is retired from being a Art gallery manager. He also was previously in the [employer]   Pain:  Pain Intensity: Present: 0/10, Best: 0/10,  Worst: 7/10 Pain location: R thoracic paraspinal/periscapular region Pain Quality: dull  Radiating: Yes, around the R ribs  Numbness/Tingling: No, He reports questionable minor numbness around R ribs Focal Weakness: No, not related to current R shoulder pain Aggravating factors: leaning to the left, standing forward from a chair, bending forward "makes me feel drunk," reaching out with RUE; Relieving factors: Tylenol, topical analgesic provides temporary relief, no help with ice/heat,  24-hour pain behavior: "All day long" History of prior shoulder or neck/shoulder injury, pain, surgery, or therapy: Yes, see history Falls: Has patient fallen in last 6 months? Yes, Number of falls: 1 Dominant hand: right Imaging: Yes, Right Shoulder Radiographs - 3 views (Grashey, Axillary, Scapular Y) performed 08/31/2021: Type I acromion. Mild AC joint degenerative change with osteophyte formation. The distal clavicle, scapula, and proximal humerus are intact. Glenohumeral and acromioclavicular joint alignment appears normal. No fractures or dislocations. Well maintained glenohumeral joint space without evidence of degenerative joint disease. No soft tissue swelling or joint effusion. No destructive bony lesion is identified.  Cervical spine MRI without contrast performed 04/15/2021 IMPRESSION: Cervical spondylosis, as outlined and with findings most notably as follows: At C5-C6, a moderate-sized central disc protrusion contributes to multifactorial severe spinal canal stenosis with moderate spinal cord flattening. No definite spinal cord signal abnormality is identified, although motion degradation limits evaluation. Multifactorial bilateral neural foraminal narrowing (moderate right, severe  left). No more than mild relative spinal canal narrowing at the remaining levels. Of note, a right center disc protrusion contacts the ventral spinal cord at C6-C7. Additional sites of foraminal stenosis, as detailed and greatest on the left at C3-C4 (moderate) and on the right at C6-C7 (moderate).    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  Recent memory is intact.  Remote memory is intact.  Attention span and concentration are intact.  Expressive speech is intact.  Patient's fund of knowledge is within normal limits for educational level. Conversation is tangential with frequent diversions     Gross Musculoskeletal Assessment Tremor: None Bulk: Normal Tone: Normal Overweight  Gait Full gait assessment deferred  Posture Forward head/rounded shoulders in sitting and standing   Cervical Screen Spurlings A (ipsilateral lateral flexion/axial compression): R: Positive for reproduction of periscapular pain L: Positive Spurlings B (ipsilateral lateral flexion/contralateral rotation/axial compression): R: Positive for reproduction of periscapular pain L: Positive Repeated movement: Not performed Hoffman Sign (cervical cord compression): R: Negative L: Negative ULTT Median: R: Not examined L: Not examined ULTT Ulnar: R: Not examined L: Not examined ULTT Radial: R: Not examined L: Not examined   AROM  AROM (Normal range in degrees)  AROM 11/03/2021  Cervical  Flexion (50) 43  Extension (80) 23*  Right lateral flexion (45) 35*  Left lateral flexion (45) 35  Right rotation (85)   Left rotation (85)    Right Left  Shoulder    Flexion    Extension    Abduction    External Rotation    Internal Rotation    Hands  Behind Head    Hands Behind Back        Elbow    Flexion    Extension    Pronation    Supination    (* = pain; Blank rows = not tested)    LE MMT:  MMT (out of 5) Right 11/03/2021 Left 11/03/2021  Cervical (isometric)  Flexion Weak  Extension Weak  Lateral Flexion Weak Weak  Rotation Weak Weak      Shoulder   Flexion 4 4  Extension    Abduction 4+ 4  External rotation 3+* 3+  Internal rotation 5 5  Horizontal abduction    Horizontal adduction    Lower Trapezius    Rhomboids        Elbow  Flexion 5 5  Extension 5 5  Pronation    Supination        Wrist  Flexion 5 5  Extension 5 5  Radial deviation    Ulnar deviation        MCP  Flexion 5 5  Extension 5 5  Abduction    Adduction    (* = pain; Blank rows = not tested)  Positive reproduction of concordant pain with resisted L shoulder ER  Sensation Deferred  Reflexes Deferred   Palpation  Location LEFT  RIGHT           Subocciptials  0  Cervical paraspinals  2  Upper Trapezius    Levator Scapulae    Rhomboid Major/Minor  2  Sternoclavicular joint    Acromioclavicular joint    Coracoid process    Long head of biceps  2  Supraspinatus    Infraspinatus    Subscapularis    Teres Minor    Teres Major    Pectoralis Major    Pectoralis Minor    Anterior Deltoid  1  Lateral Deltoid    Posterior Deltoid    Latissimus Dorsi    Sternocleidomastoid    (Blank rows = not tested) Graded on 0-4 scale (0 = no pain, 1 = pain, 2 = pain with wincing/grimacing/flinching, 3 = pain with withdrawal, 4 = unwilling to allow palpation), (Blank rows = not tested)   Repeated Movements Deferred  Passive Accessory  Intervertebral Motion Deferred   Accessory Motions/Glides Deferred   SPECIAL TESTS Rotator Cuff  Drop Arm Test: Negative Painful Arc (Pain from 60 to 120 degrees scaption): Negative Infraspinatus Muscle Test: Positive   Subacromial Impingement Deferred  Labral Tear Deferred  Bicep Tendon Pathology Deferred  Shoulder Instability Deferred   Beighton scale: Deferred  FROM 09/23/21 VISIT:  AROM  AROM (Normal range in degrees) AROM 09/23/2021  Cervical  Flexion (50) 43  Extension (80) 23*  Right lateral flexion (45) 35*  Left lateral flexion (45) 35  Right rotation (85)   Left rotation (85)    Right Left  Shoulder    Flexion 160* 160*  Extension    Abduction 145* 120*  External Rotation 60* 55*  Internal Rotation 86 85  Hands Behind Head    Hands Behind Back        Elbow    Flexion WNL   Extension WNL   Pronation    Supination    (* = pain; Blank rows = not tested)  Passive Accessory Intervertebral Motion Pt denies reproduction of shoulder pain with CPA C2-T7 and R UPA C2-T7 however he is tender centrally to pressure. Generally, hypomobile throughout however difficult to assess mobility secondary to pain;  Accessory Motions/Glides Glenohumeral:  Posterior: R: Painful, unable to fully assess L: not examined Inferior: R: abnormal, hypomobile and painful L: not examined Anterior: R: normal L: not examined  Acromioclavicular:  Posterior: R: Too painful to assess fully  L: not examined Anterior: R: Too painful to assess fully L: not examined  Sternoclavicular: Posterior: R: Too painful to assess fully L: not examined Anterior: R: Too painful to assess fully L: not examined Superior: R: Too painful to assess fully L: not examined Inferior: R: Too painful to assess fully L: not examined  Scapulothoracic: Painful in all directions so unable to fully assess mobility;   Muscle Length Testing Deferred   SPECIAL TESTS (all on right side)  Subacromial  Impingement Hawkins-Kennedy: Positive Neer (Block scapula, PROM flexion): Positive Painful Arc (Pain from 60 to 120 degrees scaption): Negative Empty Can: Positive External Rotation Resistance: Positive Horizontal Adduction: Negative Scapular Assist: Not examined  Labral Tear Biceps Load II (120 elevation, full ER, 90 elbow flexion, full supination, resisted elbow flexion): Negative Crank (160 scaption, axial load with IR/ER): Positive Active Compression Test: Negative  Bicep Tendon Pathology Speed (shoulder flexion to 90, external rotation, full elbow extension, and forearm supination with resistance: Negative Yergason's (resisted shoulder ER and supination/biceps tendon pathology): Not examined  Shoulder Instability Sulcus Sign: Positive Anterior Apprehension: Negative    TODAY'S TREATMENT   SUBJECTIVE: Pt saw Vance Peper PA with orthopedics yesterday who recommended a home cervical traction unit. He also prescribed Flexeril yesterday which the patient started and has helped his pain. He had bloodwork drawn this morning by order of his PCP. No specific questions or concerns currently.   PAIN: Denies   Ther-ex  UBE x 5 minutes (2.5 forward/2.5 backwards) for warm-up during interval history; Supine R shoulder flexion with 3# dumbbell (DB) 2 x 10; Supine R shoulder circles (CW and CCW) with 4# DB 2 x 10; Supine R shoulder serratus punch with 4# DB 2 x 10; L sidelying R shoulder abduction with 4# DB x 10, regressed to 3# x 10; L sidelying R shoulder ER with 3# DB 2 x 10; Seated R shoulder flexion with 4# DB 2 x 10;   Mechanical Traction After strengthening utilized mechanical traction at 20 degree pull angle, 18# x 8 minutes to assess pt response. Pt denies any pain during or after traction;   Not performed today: Seated Nautilus lat pull down 80# 3 x 10; Seated Nautilus rows with 50# 3 x 15 (attempted increase to 60# but too difficult and painful for patient); Standing  Nautilus chest press 50# 3 x 15; Seated scapular retractions 3 x 10 with tactile cues for proper technique; Seated overhead shoulder press with 3# dumbbell in each hand 3 x 10; Seated "W's" with green tband 2 x 10; Standing Nautilus shoulder extension 40# 3 x 10; Standing shoulder flexion and scaption with 3# dumbbells x 10 each (attempted pure abduction as well however this increases pain so discontinued);    PATIENT EDUCATION:  Education details: Pt educated throughout session about proper posture and technique with exercises. Improved exercise technique, movement at target joints, use of target muscles after min to mod verbal, visual, tactile cues. Education about follow-up with MD for persistent pain Person educated: Patient Education method: Explanation, verbal cues, tactile cues, and handout Education comprehension: verbalized understanding,    HOME EXERCISE PROGRAM: Access Code: [email or web address] URL: [email or web address] Date: 09/23/2021 Prepared by: Roxana Hires  Exercises - Seated Scapular Retraction  - 1 x daily - 7 x weekly - 2 sets -  10 reps - 2s hold - Standing Shoulder Row with Anchored Resistance  - 1 x daily - 7 x weekly - 2 sets - 10 reps - 2s hold - Shoulder W - External Rotation with Resistance  - 1 x daily - 7 x weekly - 2 sets - 10 reps - 2s hold   ASSESSMENT:  CLINICAL IMPRESSION:  Vance Peper PA recommended pt obtain a home traction unit. Pt agreed to utilize cervical traction during treatment sessions so see if he achieves any benefit before pursuing a home unit. Continued with pain-free strengthening for the rest of session today. Increased dumbbell resistance for supine and sidelying exercises. Pt encouraged to continue HEP and follow-up as scheduled. He will continue to benefit from PT services to address deficits in strength, pain, and range of motion in order to return to full function at home.    REHAB POTENTIAL: Fair secondary to multiple  comorbidities and ongoing chronic neck pain  CLINICAL DECISION MAKING: Unstable/unpredictable  EVALUATION COMPLEXITY: High   GOALS: Goals reviewed with patient? Yes  SHORT TERM GOALS: Target date: 10/14/2021   Pt will be independent with HEP to improve strength and decrease shoulder pain to improve pain-free function at home and work. Baseline:  Goal status: ONGOING   LONG TERM GOALS: Target date: 11/10/2021   Pt will increase FOTO to at least 62 to demonstrate significant improvement in function at home and work related to neck pain  Baseline: 09/15/21: 50; 10/21/21: 53 Goal status: PARTIALLY MET  2.  Pt will decrease worst shoulder pain by at least 3 points on the NPRS in order to demonstrate clinically significant reduction in shoulder pain. Baseline: 09/15/21: worst: 7/10; 10/21/21: worst: 7/10; Goal status: ONGOING  3.  Pt will decrease quick DASH score by at least 8% in order to demonstrate clinically significant reduction in disability related to shoulder pain        Baseline: 09/15/21: 37.5%, 10/21/21: 45.5%  Goal status: ONGOING  4. Pt will increase strength of pain-free R shoulder external rotation to at least 4/5 in order to demonstrate improvement in strength and function of RUE       Baseline: 09/15/21: 3+/5 painful, 10/21/21: 4+/5 with minimal pain Goal status: PARTIALLY MET   PLAN: PT FREQUENCY: 1-2x/week  PT DURATION: 8 weeks  PLANNED INTERVENTIONS: Therapeutic exercises, Therapeutic activity, Neuromuscular re-education, Balance training, Gait training, Patient/Family education, Joint manipulation, Joint mobilization, Vestibular training, Canalith repositioning, Aquatic Therapy, Dry Needling, Electrical stimulation, Spinal manipulation, Spinal mobilization, Cryotherapy, Moist heat, Traction, Ultrasound, Ionotophoresis 15m/ml Dexamethasone, and Manual therapy  PLAN FOR NEXT SESSION: Re-test for BPPV as indicated (currently resolved), continue manual techniques and  strengthening (primary focus), review and modify HEP as needed  JLyndel SafeHuprich PT, DPT, GCS  Rodolfo Gaster 11/03/2021, 2:02 PM

## 2021-11-03 ENCOUNTER — Ambulatory Visit: Payer: Medicare Other

## 2021-11-03 DIAGNOSIS — M25511 Pain in right shoulder: Secondary | ICD-10-CM

## 2021-11-07 ENCOUNTER — Encounter (INDEPENDENT_AMBULATORY_CARE_PROVIDER_SITE_OTHER): Payer: Self-pay | Admitting: Nurse Practitioner

## 2021-11-07 NOTE — Progress Notes (Signed)
Subjective:    Patient ID: Bobby Chen, male    DOB: 1945-09-17, 76 y.o.   MRN: [id number] Chief Complaint  Patient presents with   Follow-up    Ultrasound follow up     Patient returns today in follow up of worsening right leg swelling on referral from his neurologist.  He has had several falls over the past several months and this seems to have correlated with decreased activity and worsening lower extremity swelling.  The right leg is always been worse than the left.  This dates back to a major ankle fracture almost 40 years ago which likely was the nidus for his lymphedema in that leg.  He also had a Jordynne Mccown recluse spider bite on that right leg which likely was a contributor as well.  He was last seen by Dr. Delana Meyer a little over 2 years ago where some mild venous disease was seen but this was largely felt to be lymphedema.  He initially was doing well with compression socks and did not require anything further.  Over the past year or 2, his symptoms have worsened.  He has swelling in the left leg now as well although not as severe.  No open wounds or infection.  No fevers or chills.  No chest pain or shortness of breath.  The patient continues to have some mild venous disease in his right toe there is no evidence of reflux or insufficiency in the left lower extremity.  No DVT or superficial thrombophlebitis bilaterally.    Review of Systems  Cardiovascular:  Positive for leg swelling.  All other systems reviewed and are negative.      Objective:   Physical Exam Vitals reviewed.  HENT:     Head: Normocephalic.  Cardiovascular:     Rate and Rhythm: Normal rate.     Pulses: Normal pulses.  Pulmonary:     Effort: Pulmonary effort is normal.  Musculoskeletal:     Right lower leg: Edema present.     Left lower leg: Edema present.  Skin:    General: Skin is warm and dry.     Comments: Dermal thickening bilaterally  Neurological:     Mental Status: He is alert and oriented to  person, place, and time.  Psychiatric:        Mood and Affect: Mood normal.        Behavior: Behavior normal.        Thought Content: Thought content normal.        Judgment: Judgment normal.     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, LEFT ARM    COPD (chronic obstructive pulmonary disease) (HCC)    Diabetes mellitus without complication (HCC)    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HCC)    Elevated lipids    Hypertension    Obesity    Pulmonary arterial hypertension ([place])    Sleep apnea     Social History   Socioeconomic History   Marital status: Married    Spouse name: Not on file   Number of children: Not on file   Years of education: Not on file   Highest education level: Not on file  Occupational History  Not on file  Tobacco Use   Smoking status: Never   Smokeless tobacco: Never  Vaping Use   Vaping Use: Never used  Substance and Sexual Activity   Alcohol use: No   Drug use: No   Sexual activity: Not on file  Other Topics Concern   Not on file  Social History Narrative   Not on file   Social Determinants of Health   Financial Resource Strain: Not on file  Food Insecurity: Not on file  Transportation Needs: Not on file  Physical Activity: Not on file  Stress: Not on file  Social Connections: Not on file  Intimate Partner Violence: Not on file    Past Surgical History:  Procedure Laterality Date   ANKLE FRACTURE SURGERY     BASAL CELL CARCINOMA EXCISION N/A    scalp   COLONOSCOPY W/ POLYPECTOMY     COLONOSCOPY WITH PROPOFOL N/A 01/18/2016   Procedure: COLONOSCOPY WITH PROPOFOL;  Surgeon: Lollie Sails, MD;  Location: [facility] ENDOSCOPY;  Service: Endoscopy;  Laterality: N/A;   COLONOSCOPY WITH PROPOFOL N/A 07/02/2018   Procedure: COLONOSCOPY WITH PROPOFOL;  Surgeon:  Lollie Sails, MD;  Location: [facility] ENDOSCOPY;  Service: Endoscopy;  Laterality: N/A;   ESOPHAGOGASTRODUODENOSCOPY (EGD) WITH PROPOFOL N/A 01/18/2016   Procedure: ESOPHAGOGASTRODUODENOSCOPY (EGD) WITH PROPOFOL;  Surgeon: Lollie Sails, MD;  Location: [facility] ENDOSCOPY;  Service: Endoscopy;  Laterality: N/A;   FRACTURE SURGERY Right 1984   ankle   HERNIA REPAIR     UMBILICAL   LEG SURGERY Left    NASAL SEPTOPLASTY W/ TURBINOPLASTY Bilateral 11/06/2019   Procedure: NASAL SEPTOPLASTY WITH INFERIOR TURBINATE REDUCTION;  Surgeon: Margaretha Sheffield, MD;  Location: [facility] ORS;  Service: ENT;  Laterality: Bilateral;   toenail removal Bilateral     Family History  Problem Relation Age of Onset   Ovarian cancer Mother    Ulcers Father     Allergies  Allergen Reactions   Amoxil [Amoxicillin] Nausea And Vomiting   Aspirin Buf(Cacarb-Mgcarb-Mgo)     "knots" on the back of head   Aspirin Buffered     Other reaction(s): Other (See Comments)       Latest Ref Rng & Units 11/04/2019    3:02 PM  CBC  WBC 4.0 - 10.5 K/uL 9.0   Hemoglobin 13.0 - 17.0 g/dL 14.1   Hematocrit 39.0 - 52.0 % 41.6   Platelets 150 - 400 K/uL 212       CMP     Component Value Date/Time   NA 141 11/04/2019 1502   K 4.0 11/04/2019 1502   CL 102 11/04/2019 1502   CO2 30 11/04/2019 1502   GLUCOSE 81 11/04/2019 1502   BUN 20 11/04/2019 1502   CREATININE 1.39 (H) 11/04/2019 1502   CALCIUM 9.8 11/04/2019 1502   PROT 7.2 11/04/2019 1502   ALBUMIN 4.4 11/04/2019 1502   AST 25 11/04/2019 1502   ALT 29 11/04/2019 1502   ALKPHOS 45 11/04/2019 1502   BILITOT 1.0 11/04/2019 1502   GFRNONAA 50 (L) 11/04/2019 1502   GFRAA 58 (L) 11/04/2019 1502     No results found.     Assessment & Plan:   1. Lymphedema Recommend:  No surgery or intervention at this point in time.    I have reviewed my discussion with the patient regarding lymphedema and why it  causes symptoms.  Patient will continue wearing graduated  compression on a daily basis. The patient should put the compression on first thing in the morning  and removing them in the evening. The patient should not sleep in the compression.   In addition, behavioral modification throughout the day will be continued.  This will include frequent elevation (such as in a recliner), use of over the counter pain medications as needed and exercise such as walking.  The systemic causes for chronic edema such as liver, kidney and cardiac etiologies do not appear to have significant changed over the past year.    Despite conservative treatments, at least 4 weeks, including graduated compression therapy class 1 and behavioral modification including exercise and elevation the patient  has not obtained adequate control of the lymphedema.  The patient still has stage 3 lymphedema and therefore, I believe that a lymph pump should be added to improve the control of the patient's lymphedema.  Additionally, a lymph pump is warranted because it will reduce the risk of cellulitis and ulceration in the future.  Patient should follow-up in six months    2. Type 2 diabetes mellitus with stage 3a chronic kidney disease, without long-term current use of insulin (HCC) Continue hypoglycemic medications as already ordered, these medications have been reviewed and there are no changes at this time.  Hgb A1C to be monitored as already arranged by primary service   3. Essential hypertension with goal blood pressure less than 130/80 Continue antihypertensive medications as already ordered, these medications have been reviewed and there are no changes at this time.    Current Outpatient Medications on File Prior to Visit  Medication Sig Dispense Refill   acetaminophen (TYLENOL) 500 MG tablet Take 500 mg by mouth every 6 (six) hours as needed.     albuterol (PROVENTIL HFA;VENTOLIN HFA) 108 (90 Base) MCG/ACT inhaler Inhale 2 puffs into the lungs every 4 (four) hours as needed for  wheezing or shortness of breath.     amLODipine (NORVASC) 10 MG tablet Take 10 mg by mouth daily.     Bioflavonoid Products (ESTER C PO) Take 500 mg by mouth daily.     budesonide (PULMICORT FLEXHALER) 180 MCG/ACT inhaler Inhale into the lungs.     busPIRone (BUSPAR) 7.5 MG tablet Take by mouth.     chlorhexidine (PERIDEX) 0.12 % solution SMARTSIG:By Mouth     citalopram (CELEXA) 10 MG tablet Take 1 tablet by mouth daily.     clobetasol ointment (TEMOVATE) 0.05 %      fluticasone (FLONASE) 50 MCG/ACT nasal spray Place 2 sprays into both nostrils daily. 16 g 0   glipiZIDE (GLUCOTROL XL) 5 MG 24 hr tablet Take 5 mg by mouth daily with breakfast.     metFORMIN (GLUCOPHAGE-XR) 500 MG 24 hr tablet Take 1,000 mg by mouth 2 (two) times daily.      montelukast (SINGULAIR) 10 MG tablet      nortriptyline (PAMELOR) 10 MG capsule Take by mouth.     pantoprazole (PROTONIX) 40 MG tablet Take 40 mg by mouth daily as needed (heartburn).      pregabalin (LYRICA) 50 MG capsule Take 50 mg by mouth 2 (two) times daily.     simvastatin (ZOCOR) 40 MG tablet Take 40 mg by mouth daily.     traMADol (ULTRAM) 50 MG tablet Take 1 tablet (50 mg total) by mouth every 6 (six) hours as needed. 15 tablet 0   triamterene-hydrochlorothiazide (MAXZIDE-25) 37.5-25 MG tablet Take 1 tablet by mouth daily.     VITAMIN A PO Take 1 tablet by mouth daily.     No current facility-administered medications on file  prior to visit.    There are no Patient Instructions on file for this visit. No follow-ups on file.   Kris Hartmann, NP

## 2021-11-08 ENCOUNTER — Other Ambulatory Visit: Payer: Self-pay | Admitting: Hospitalist

## 2021-11-08 ENCOUNTER — Other Ambulatory Visit: Payer: Self-pay | Admitting: Sports Medicine

## 2021-11-08 DIAGNOSIS — W19XXXD Unspecified fall, subsequent encounter: Secondary | ICD-10-CM

## 2021-11-08 DIAGNOSIS — G8929 Other chronic pain: Secondary | ICD-10-CM

## 2021-11-08 NOTE — Therapy (Signed)
OUTPATIENT PHYSICAL THERAPY SHOULDER TREATMENT/RECERTIFICATION   Patient Name: Bobby STAMPER MRN: [id number] DOB:Nov 21, 1945, 76 y.o., male Today's Date: 11/09/2021   PT End of Session - 11/09/21 1411     Visit Number 13    Number of Visits 33    Date for PT Re-Evaluation 01/04/22    Authorization Type eval: 05/21/49, recertification 7/00/17    PT Start Time 1408    PT Stop Time 1445    PT Time Calculation (min) 37 min    Activity Tolerance Patient tolerated treatment well    Behavior During Therapy [facility] for tasks assessed/performed              Past Medical History:  Diagnosis Date   Anginal pain ([place])    Brown recluse spider bite    Cancer ([place])    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, LEFT ARM    COPD (chronic obstructive pulmonary disease) ([place])    Diabetes mellitus without complication ([place])    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[place])    Elevated lipids    Hypertension    Obesity    Pulmonary arterial hypertension ([place])    Sleep apnea    Past Surgical History:  Procedure Laterality Date   ANKLE FRACTURE SURGERY     BASAL CELL CARCINOMA EXCISION N/A    scalp   COLONOSCOPY W/ POLYPECTOMY     COLONOSCOPY WITH PROPOFOL N/A 01/18/2016   Procedure: COLONOSCOPY WITH PROPOFOL;  Surgeon: Lollie Sails, MD;  Location: [facility] ENDOSCOPY;  Service: Endoscopy;  Laterality: N/A;   COLONOSCOPY WITH PROPOFOL N/A 07/02/2018   Procedure: COLONOSCOPY WITH PROPOFOL;  Surgeon: Lollie Sails, MD;  Location: [facility] ENDOSCOPY;  Service: Endoscopy;  Laterality: N/A;   ESOPHAGOGASTRODUODENOSCOPY (EGD) WITH PROPOFOL N/A 01/18/2016   Procedure: ESOPHAGOGASTRODUODENOSCOPY (EGD) WITH PROPOFOL;  Surgeon: Lollie Sails, MD;  Location: [facility] ENDOSCOPY;  Service: Endoscopy;  Laterality: N/A;   FRACTURE SURGERY Right 1984   ankle   HERNIA REPAIR     UMBILICAL   LEG SURGERY Left    NASAL SEPTOPLASTY W/ TURBINOPLASTY Bilateral 11/06/2019   Procedure: NASAL SEPTOPLASTY WITH INFERIOR TURBINATE REDUCTION;  Surgeon: Margaretha Sheffield, MD;  Location: [facility] ORS;  Service: ENT;  Laterality: Bilateral;   toenail removal Bilateral    Patient Active Problem List   Diagnosis Date Noted   Pain in joint, ankle and foot 10/19/2021   Lymphedema 10/19/2021   Spinal stenosis of cervical region 08/26/2021   Bilateral hearing loss 04/10/2020   Difficulty walking 04/10/2020   Sensory ataxia 04/10/2020   B12 deficiency 02/03/2020   Leg pain, right 08/26/2019   Chronic venous insufficiency 08/26/2019   Venous insufficiency 08/26/2019   PAD (peripheral artery disease) ([place]) 08/14/2019   PVD (peripheral vascular disease) ([place]) 08/14/2019   Obstructive sleep apnea 05/06/2019   Obesity, Class II, BMI 35-39.9 03/11/2019   Type 2 diabetes mellitus with stage 3a chronic kidney disease, without long-term current use of insulin ([place]) 03/11/2019   Pain in joint involving ankle and foot 11/12/2018   Nuclear sclerotic cataract of both eyes 08/30/2018   Abnormal sensation in left ear 08/20/2018   Dizziness 08/20/2018   Numbness and tingling of left side of face 08/20/2018   Essential hypertension with goal blood pressure less than 130/80 03/19/2018   Healthcare maintenance 08/07/2017   Brown recluse spider bite 05/08/2017   SCC (squamous cell carcinoma), 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/2015   Severe obesity (BMI 35.0-35.9 with comorbidity) ([place]) 06/01/2015   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[place]) 09/29/2013   Hyperlipidemia associated with type 2 diabetes  mellitus ([place]) 09/29/2013    PCP: Kirk Ruths, MD  REFERRING PROVIDER: Kirk Ruths, MD  REFERRING DIAGNOSIS: M25.511 (ICD-10-CM) - Pain in right shoulder W19.XXXA (ICD-10-CM) - Unspecified fall, initial encounter [phone] (ICD-10-CM) - Other muscle spasm M75.41 (ICD-10-CM) - Impingement syndrome of right shoulder   THERAPY DIAG: Right shoulder pain, unspecified chronicity  RATIONALE FOR EVALUATION AND TREATMENT: Rehabilitation  ONSET DATE: 06/23/21 (approximate)  FOLLOW UP APPT WITH  PROVIDER: Yes, returns in June 2023 to see [name]   From initial evaluation  SUBJECTIVE:                                                                                                                                                                                         Chief Complaint: R shoulder pain  Pertinent History Pt referred to PT for R shoulder pain by orthopedics. His pain began after a fall at the end of March 2023. He was walking down steps and started leaning forward. He responded by leaning back and he fell backwards bracing himself with his arms. He experienced immediate R shoulder pain. He denies any additional falls in the last 6 months but he did have a fall about 1 year ago. He had an evaluation of his R shoulder pain at his PCP office. Plain films of his thoracic spine and ribs were obtained which did not show any rib fractures but multilevel disc changes in his thoracic spine. He reports that prior to his fall he was receiving physical therapy for his cervical stenosis with notable improvement in his pain and mobility. He was last evaluated by physiatry on 08/26/2021 for his neck pain that radiates into his right arm. This was suspected to be due to his nerve root impingement seen on recent cervical spine MRI. There is also question of carpal tunnel syndrome. He was recommended EMG of the bilateral upper extremities and epidural steroid injection to his cervical spine. Pt states that [facility] PT told him there were "unable to see him" for his R shoulder pain. Since his fall he has had more pain along his posterior right shoulder. The pain is "under my shoulder blade" and it radiates around his right trunk. He points to his lateral R ribs, scapula, and periscapular area mostly.  He denies associated swelling, shoulder locking/catching, shoulder instability, weakness, fevers or chills, night sweats, weight loss, skin color change. He has tried acetaminophen, [place] dream  cream, activity modification, chronic medication nortriptyline/pregabalin/tramadol with continued symptoms. He is right hand dominant and is retired from being a Art gallery manager. He also was previously in the [employer]   Pain:  Pain Intensity: Present: 0/10, Best:  0/10, Worst: 7/10 Pain location: R thoracic paraspinal/periscapular region Pain Quality: dull  Radiating: Yes, around the R ribs  Numbness/Tingling: No, He reports questionable minor numbness around R ribs Focal Weakness: No, not related to current R shoulder pain Aggravating factors: leaning to the left, standing forward from a chair, bending forward "makes me feel drunk," reaching out with RUE; Relieving factors: Tylenol, topical analgesic provides temporary relief, no help with ice/heat,  24-hour pain behavior: "All day long" History of prior shoulder or neck/shoulder injury, pain, surgery, or therapy: Yes, see history Falls: Has patient fallen in last 6 months? Yes, Number of falls: 1 Dominant hand: right Imaging: Yes, Right Shoulder Radiographs - 3 views (Gr[place]hey, Axillary, Scapular Y) performed 08/31/2021: Type I acromion. Mild AC joint degenerative change with osteophyte formation. The distal clavicle, scapula, and proximal humerus are intact. Glenohumeral and acromioclavicular joint alignment appears normal. No fractures or dislocations. Well maintained glenohumeral joint space without evidence of degenerative joint disease. No soft tissue swelling or joint effusion. No destructive bony lesion is identified.  Cervical spine MRI without contrast performed 04/15/2021 IMPRESSION: Cervical spondylosis, as outlined and with findings most notably as follows: At C5-C6, a moderate-sized central disc protrusion contributes to multifactorial severe spinal canal stenosis with moderate spinal cord flattening. No definite spinal cord signal abnormality is identified, although motion degradation limits evaluation. Multifactorial bilateral neural foraminal  narrowing (moderate right, severe left). No more than mild relative spinal canal narrowing at the remaining levels. Of note, a right center disc protrusion contacts the ventral spinal cord at C6-C7. Additional sites of foraminal stenosis, as detailed and greatest on the left at C3-C4 (moderate) and on the right at C6-C7 (moderate).    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  Recent memory is intact.  Remote memory is intact.  Attention span and concentration are intact.  Expressive speech is intact.  Patient's fund of knowledge is within normal limits for educational level. Conversation is tangential with frequent diversions     Gross Musculoskeletal Assessment Tremor: None Bulk: Normal Tone: Normal Overweight  Gait Full gait assessment deferred  Posture Forward head/rounded shoulders in sitting and standing   Cervical Screen Spurlings A (ipsilateral lateral flexion/axial compression): R: Positive for reproduction of periscapular pain L: Positive Spurlings B (ipsilateral lateral flexion/contralateral rotation/axial compression): R: Positive for reproduction of periscapular pain L: Positive Repeated movement: Not performed Hoffman Sign (cervical cord compression): R: Negative L: Negative ULTT Median: R: Not examined L: Not examined ULTT Ulnar: R: Not examined L: Not examined ULTT Radial: R: Not examined L: Not examined   AROM  AROM  (Normal range in degrees) AROM 11/09/2021  Cervical  Flexion (50) 43  Extension (80) 23*  Right lateral flexion (45) 35*  Left lateral flexion (45) 35  Right rotation (85)   Left rotation (85)    Right Left  Shoulder    Flexion    Extension    Abduction    External Rotation    Internal Rotation  Hands Behind Head    Hands Behind Back        Elbow    Flexion    Extension    Pronation    Supination    (* = pain; Blank rows = not tested)    LE MMT:  MMT (out of 5) Right 11/09/2021 Left 11/09/2021  Cervical (isometric)  Flexion Weak  Extension Weak  Lateral Flexion Weak Weak  Rotation Weak Weak      Shoulder   Flexion 4 4  Extension    Abduction 4+ 4  External rotation 3+* 3+  Internal rotation 5 5  Horizontal abduction    Horizontal adduction    Lower Trapezius    Rhomboids        Elbow  Flexion 5 5  Extension 5 5  Pronation    Supination        Wrist  Flexion 5 5  Extension 5 5  Radial deviation    Ulnar deviation        MCP  Flexion 5 5  Extension 5 5  Abduction    Adduction    (* = pain; Blank rows = not tested)  Positive reproduction of concordant pain with resisted L shoulder ER  Sensation Deferred  Reflexes Deferred   Palpation  Location LEFT  RIGHT           Subocciptials  0  Cervical paraspinals  2  Upper Trapezius    Levator Scapulae    Rhomboid Major/Minor  2  Sternoclavicular joint    Acromioclavicular joint    Coracoid process    Long head of biceps  2  Supraspinatus    Infraspinatus    Subscapularis    Teres Minor    Teres Major    Pectoralis Major    Pectoralis Minor    Anterior Deltoid  1  Lateral Deltoid    Posterior Deltoid    Latissimus Dorsi    Sternocleidomastoid    (Blank rows = not tested) Graded on 0-4 scale (0 = no pain, 1 = pain, 2 = pain with wincing/grimacing/flinching, 3 = pain with withdrawal, 4 = unwilling to allow palpation), (Blank rows = not tested)   Repeated  Movements Deferred  Passive Accessory Intervertebral Motion Deferred   Accessory Motions/Glides Deferred   SPECIAL TESTS Rotator Cuff  Drop Arm Test: Negative Painful Arc (Pain from 60 to 120 degrees scaption): Negative Infraspinatus Muscle Test: Positive   Subacromial Impingement Deferred  Labral Tear Deferred  Bicep Tendon Pathology Deferred  Shoulder Instability Deferred   Beighton scale: Deferred  FROM 09/23/21 VISIT:  AROM  AROM (Normal range in degrees) AROM 09/23/2021  Cervical  Flexion (50) 43  Extension (80) 23*  Right lateral flexion (45) 35*  Left lateral flexion (45) 35  Right rotation (85)   Left rotation (85)    Right Left  Shoulder    Flexion 160* 160*  Extension    Abduction 145* 120*  External Rotation 60* 55*  Internal Rotation 86 85  Hands Behind Head    Hands Behind Back        Elbow    Flexion WNL   Extension WNL   Pronation    Supination    (* = pain; Blank rows = not tested)  Passive Accessory Intervertebral Motion Pt denies reproduction of shoulder pain with CPA C2-T7 and R UPA C2-T7 however he is tender centrally to pressure. Generally, hypomobile throughout however difficult to assess mobility secondary to pain;  Accessory Motions/Glides  Glenohumeral: Posterior: R: Painful, unable to fully assess L: not examined Inferior: R: abnormal, hypomobile and painful L: not examined Anterior: R: normal L: not examined  Acromioclavicular:  Posterior: R: Too painful to assess fully  L: not examined Anterior: R: Too painful to assess fully L: not examined  Sternoclavicular: Posterior: R: Too painful to assess fully L: not examined Anterior: R: Too painful to assess fully L: not examined Superior: R: Too painful to assess fully L: not examined Inferior: R: Too painful to assess fully L: not examined  Scapulothoracic: Painful in all directions so unable to fully assess mobility;   Muscle Length Testing Deferred   SPECIAL  TESTS (all on right side)  Subacromial Impingement Hawkins-Kennedy: Positive Neer (Block scapula, PROM flexion): Positive Painful Arc (Pain from 60 to 120 degrees scaption): Negative Empty Can: Positive External Rotation Resistance: Positive Horizontal Adduction: Negative Scapular Assist: Not examined  Labral Tear Biceps Load II (120 elevation, full ER, 90 elbow flexion, full supination, resisted elbow flexion): Negative Crank (160 scaption, axial load with IR/ER): Positive Active Compression Test: Negative  Bicep Tendon Pathology Speed (shoulder flexion to 90, external rotation, full elbow extension, and forearm supination with resistance: Negative Yergason's (resisted shoulder ER and supination/biceps tendon pathology): Not examined  Shoulder Instability Sulcus Sign: Positive Anterior Apprehension: Negative    TODAY'S TREATMENT   SUBJECTIVE: Pt saw Dr. Candelaria Stagers with orthopedics who ordered a thoracic MRI which pt will have done on Saturday. He has continued taking the Flexeril to help with his pain. He has not had any further vertigo or dizziness. No resting pain upon arrival. No specific questions or concerns currently.   PAIN: Denies at rest but still has pain when reaching with R arm   Ther-ex  UBE x 5 minutes (2.5 forward/2.5 backwards) for warm-up, unbilled; Supine R shoulder flexion with 4# dumbbell (DB) 2 x 10; Supine R shoulder circles (CW and CCW) with 4# DB 2 x 10; Supine R shoulder serratus punch with 4# DB 2 x 10; L sidelying R shoulder abduction with 4# DB x 10; L sidelying R shoulder ER with 4# DB 2 x 10;   Mechanical Traction After strengthening utilized mechanical traction at 20 degree pull angle, 20# x 8 minutes to assess pt response. Pt denies any pain during or after traction;   Not performed today: Seated Nautilus lat pull down 80# 3 x 10; Seated Nautilus rows with 50# 3 x 15 (attempted increase to 60# but too difficult and painful for  patient); Standing Nautilus chest press 50# 3 x 15; Seated scapular retractions 3 x 10 with tactile cues for proper technique; Seated overhead shoulder press with 3# dumbbell in each hand 3 x 10; Seated "W's" with green tband 2 x 10; Standing Nautilus shoulder extension 40# 3 x 10; Standing shoulder flexion and scaption with 3# dumbbells x 10 each (attempted pure abduction as well however this increases pain so discontinued); Seated R shoulder flexion with 4# DB 2 x 10;    PATIENT EDUCATION:  Education details: Pt educated throughout session about proper posture and technique with exercises. Improved exercise technique, movement at target joints, use of target muscles after min to mod verbal, visual, tactile cues. Education about follow-up with MD for persistent pain Person educated: Patient Education method: Explanation, verbal cues, tactile cues, and handout Education comprehension: verbalized understanding,    HOME EXERCISE PROGRAM: Access Code: [email or web address] URL: [email or web address] Date: 09/23/2021 Prepared by: Roxana Hires  Exercises - Seated Scapular Retraction  - 1 x  daily - 7 x weekly - 2 sets - 10 reps - 2s hold - Standing Shoulder Row with Anchored Resistance  - 1 x daily - 7 x weekly - 2 sets - 10 reps - 2s hold - Shoulder W - External Rotation with Resistance  - 1 x daily - 7 x weekly - 2 sets - 10 reps - 2s hold   ASSESSMENT:  CLINICAL IMPRESSION:  Pt reports complete resolution of his vertigo/dizziness at this time. Outcome measures and goals updated with patient during 10/21/21 visit. His QuickDASH was slightly worse however his FOTO had improved. His R shoulder ER strength had also improved considerably and was less painful with resistance. He continues to report that his worst pain will get up to 7/10. Overall he reported approximately 30% improvement in his symptoms since starting with therapy. Pt agreed to utilize cervical traction again during treatment  session to see if he achieves any benefit before pursuing a home unit. Vance Peper PA-C is recommending a home traction unit. Continued with pain-free strengthening for the rest of session today. Increased dumbbell resistance for supine and sidelying exercises. Pt encouraged to continue HEP and follow-up as scheduled. He will continue to benefit from PT services to address deficits in strength, pain, and range of motion in order to return to full function at home.   REHAB POTENTIAL: Fair secondary to multiple comorbidities and ongoing chronic neck pain  CLINICAL DECISION MAKING: Unstable/unpredictable  EVALUATION COMPLEXITY: High   GOALS: Goals reviewed with patient? Yes  SHORT TERM GOALS: Target date: 12/07/2021   Pt will be independent with HEP to improve strength and decrease shoulder pain to improve pain-free function at home and work. Baseline:  Goal status: ONGOING   LONG TERM GOALS: Target date: 01/04/2022   Pt will increase FOTO to at least 62 to demonstrate significant improvement in function at home and work related to neck pain  Baseline: 09/15/21: 50; 10/21/21: 53 Goal status: PARTIALLY MET  2.  Pt will decrease worst shoulder pain by at least 3 points on the NPRS in order to demonstrate clinically significant reduction in shoulder pain. Baseline: 09/15/21: worst: 7/10; 10/21/21: worst: 7/10; Goal status: ONGOING  3.  Pt will decrease quick DASH score by at least 8% in order to demonstrate clinically significant reduction in disability related to shoulder pain        Baseline: 09/15/21: 37.5%, 10/21/21: 45.5%  Goal status: ONGOING  4. Pt will increase strength of pain-free R shoulder external rotation to at least 4/5 in order to demonstrate improvement in strength and function of RUE       Baseline: 09/15/21: 3+/5 painful, 10/21/21: 4+/5 with minimal pain Goal status: PARTIALLY MET   PLAN: PT FREQUENCY: 1-2x/week  PT DURATION: 8 weeks  PLANNED INTERVENTIONS: Therapeutic  exercises, Therapeutic activity, Neuromuscular re-education, Balance training, Gait training, Patient/Family education, Joint manipulation, Joint mobilization, Vestibular training, Canalith repositioning, Aquatic Therapy, Dry Needling, Electrical stimulation, Spinal manipulation, Spinal mobilization, Cryotherapy, Moist heat, Traction, Ultrasound, Ionotophoresis 1m/ml Dexamethasone, and Manual therapy  PLAN FOR NEXT SESSION:  continue manual techniques and strengthening (primary focus), review and modify HEP as needed  JLyndel SafeHuprich PT, DPT, GCS  Kinsey Karch 11/09/2021, 3:17 PM

## 2021-11-09 ENCOUNTER — Ambulatory Visit: Payer: Medicare Other

## 2021-11-09 DIAGNOSIS — M25511 Pain in right shoulder: Secondary | ICD-10-CM

## 2021-11-11 ENCOUNTER — Ambulatory Visit: Payer: Medicare Other

## 2021-11-11 DIAGNOSIS — M25511 Pain in right shoulder: Secondary | ICD-10-CM | POA: Diagnosis not present

## 2021-11-11 NOTE — Therapy (Signed)
OUTPATIENT PHYSICAL THERAPY SHOULDER TREATMENT   Patient Name: Bobby Chen MRN: [id number] DOB:07/26/1945, 76 y.o., male Today's Date: 11/12/2021   PT End of Session - 11/11/21 1542     Visit Number 14    Number of Visits 33    Date for PT Re-Evaluation 01/04/22    Authorization Type eval: 04/27/13, recertification 9/45/85    PT Start Time 1531    PT Stop Time 1615    PT Time Calculation (min) 44 min    Activity Tolerance Patient tolerated treatment well    Behavior During Therapy [facility] for tasks assessed/performed              Past Medical History:  Diagnosis Date   Anginal pain ([place])    Brown recluse spider bite    Cancer ([place])    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, LEFT ARM    COPD (chronic obstructive pulmonary disease) ([place])    Diabetes mellitus without complication ([place])    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[place])    Elevated lipids    Hypertension    Obesity    Pulmonary arterial hypertension ([place])    Sleep apnea    Past Surgical History:  Procedure Laterality Date   ANKLE FRACTURE SURGERY     BASAL CELL CARCINOMA EXCISION N/A    scalp   COLONOSCOPY W/ POLYPECTOMY     COLONOSCOPY WITH PROPOFOL N/A 01/18/2016   Procedure: COLONOSCOPY WITH PROPOFOL;  Surgeon: Lollie Sails, MD;  Location: [facility] ENDOSCOPY;  Service: Endoscopy;  Laterality: N/A;   COLONOSCOPY WITH PROPOFOL N/A 07/02/2018   Procedure: COLONOSCOPY WITH PROPOFOL;  Surgeon: Lollie Sails, MD;  Location: [facility] ENDOSCOPY;  Service: Endoscopy;  Laterality: N/A;   ESOPHAGOGASTRODUODENOSCOPY (EGD) WITH PROPOFOL N/A 01/18/2016   Procedure: ESOPHAGOGASTRODUODENOSCOPY (EGD) WITH PROPOFOL;  Surgeon: Lollie Sails, MD;  Location: [facility] ENDOSCOPY;  Service: Endoscopy;  Laterality: N/A;   FRACTURE SURGERY Right 1984   ankle   HERNIA REPAIR     UMBILICAL   LEG SURGERY Left    NASAL SEPTOPLASTY W/ TURBINOPLASTY Bilateral 11/06/2019   Procedure: NASAL SEPTOPLASTY WITH INFERIOR TURBINATE REDUCTION;  Surgeon: Margaretha Sheffield, MD;   Location: [facility] ORS;  Service: ENT;  Laterality: Bilateral;   toenail removal Bilateral    Patient Active Problem List   Diagnosis Date Noted   Pain in joint, ankle and foot 10/19/2021   Lymphedema 10/19/2021   Spinal stenosis of cervical region 08/26/2021   Bilateral hearing loss 04/10/2020   Difficulty walking 04/10/2020   Sensory ataxia 04/10/2020   B12 deficiency 02/03/2020   Leg pain, right 08/26/2019   Chronic venous insufficiency 08/26/2019   Venous insufficiency 08/26/2019   PAD (peripheral artery disease) ([place]) 08/14/2019   PVD (peripheral vascular disease) ([place]) 08/14/2019   Obstructive sleep apnea 05/06/2019   Obesity, Class II, BMI 35-39.9 03/11/2019   Type 2 diabetes mellitus with stage 3a chronic kidney disease, without long-term current use of insulin ([place]) 03/11/2019   Pain in joint involving ankle and foot 11/12/2018   Nuclear sclerotic cataract of both eyes 08/30/2018   Abnormal sensation in left ear 08/20/2018   Dizziness 08/20/2018   Numbness and tingling of left side of face 08/20/2018   Essential hypertension with goal blood pressure less than 130/80 03/19/2018   Healthcare maintenance 08/07/2017   Brown recluse spider bite 05/08/2017   SCC (squamous cell carcinoma), 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/2015   Severe obesity (BMI 35.0-35.9 with comorbidity) ([place]) 06/01/2015   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[place]) 09/29/2013   Hyperlipidemia associated with type 2 diabetes  mellitus ([place]) 09/29/2013    PCP: Kirk Ruths, MD  REFERRING PROVIDER: Diamond Nickel, DO  REFERRING DIAGNOSIS: M25.511 (ICD-10-CM) - Pain in right shoulder W19.XXXA (ICD-10-CM) - Unspecified fall, initial encounter [phone] (ICD-10-CM) - Other muscle spasm M75.41 (ICD-10-CM) - Impingement syndrome of right shoulder   THERAPY DIAG: Right shoulder pain, unspecified chronicity  RATIONALE FOR EVALUATION AND TREATMENT: Rehabilitation  ONSET DATE: 06/23/21 (approximate)  FOLLOW UP APPT WITH PROVIDER: Yes,  returns in June 2023 to see [name]   From initial evaluation  SUBJECTIVE:                                                                                                                                                                                         Chief Complaint: R shoulder pain  Pertinent History Pt referred to PT for R shoulder pain by orthopedics. His pain began after a fall at the end of March 2023. He was walking down steps and started leaning forward. He responded by leaning back and he fell backwards bracing himself with his arms. He experienced immediate R shoulder pain. He denies any additional falls in the last 6 months but he did have a fall about 1 year ago. He had an evaluation of his R shoulder pain at his PCP office. Plain films of his thoracic spine and ribs were obtained which did not show any rib fractures but multilevel disc changes in his thoracic spine. He reports that prior to his fall he was receiving physical therapy for his cervical stenosis with notable improvement in his pain and mobility. He was last evaluated by physiatry on 08/26/2021 for his neck pain that radiates into his right arm. This was suspected to be due to his nerve root impingement seen on recent cervical spine MRI. There is also question of carpal tunnel syndrome. He was recommended EMG of the bilateral upper extremities and epidural steroid injection to his cervical spine. Pt states that [facility] PT told him there were "unable to see him" for his R shoulder pain. Since his fall he has had more pain along his posterior right shoulder. The pain is "under my shoulder blade" and it radiates around his right trunk. He points to his lateral R ribs, scapula, and periscapular area mostly.  He denies associated swelling, shoulder locking/catching, shoulder instability, weakness, fevers or chills, night sweats, weight loss, skin color change. He has tried acetaminophen, [place] dream cream,  activity modification, chronic medication nortriptyline/pregabalin/tramadol with continued symptoms. He is right hand dominant and is retired from being a Art gallery manager. He also was previously in the [employer]   Pain:  Pain Intensity: Present: 0/10, Best:  0/10, Worst: 7/10 Pain location: R thoracic paraspinal/periscapular region Pain Quality: dull  Radiating: Yes, around the R ribs  Numbness/Tingling: No, He reports questionable minor numbness around R ribs Focal Weakness: No, not related to current R shoulder pain Aggravating factors: leaning to the left, standing forward from a chair, bending forward "makes me feel drunk," reaching out with RUE; Relieving factors: Tylenol, topical analgesic provides temporary relief, no help with ice/heat,  24-hour pain behavior: "All day long" History of prior shoulder or neck/shoulder injury, pain, surgery, or therapy: Yes, see history Falls: Has patient fallen in last 6 months? Yes, Number of falls: 1 Dominant hand: right Imaging: Yes, Right Shoulder Radiographs - 3 views (Gr[place], Axillary, Scapular Y) performed 08/31/2021: Type I acromion. Mild AC joint degenerative change with osteophyte formation. The distal clavicle, scapula, and proximal humerus are intact. Glenohumeral and acromioclavicular joint alignment appears normal. No fractures or dislocations. Well maintained glenohumeral joint space without evidence of degenerative joint disease. No soft tissue swelling or joint effusion. No destructive bony lesion is identified.  Cervical spine MRI without contrast performed 04/15/2021 IMPRESSION: Cervical spondylosis, as outlined and with findings most notably as follows: At C5-C6, a moderate-sized central disc protrusion contributes to multifactorial severe spinal canal stenosis with moderate spinal cord flattening. No definite spinal cord signal abnormality is identified, although motion degradation limits evaluation. Multifactorial bilateral neural foraminal  narrowing (moderate right, severe left). No more than mild relative spinal canal narrowing at the remaining levels. Of note, a right center disc protrusion contacts the ventral spinal cord at C6-C7. Additional sites of foraminal stenosis, as detailed and greatest on the left at C3-C4 (moderate) and on the right at C6-C7 (moderate).    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  Recent memory is intact.  Remote memory is intact.  Attention span and concentration are intact.  Expressive speech is intact.  Patient's fund of knowledge is within normal limits for educational level. Conversation is tangential with frequent diversions     Gross Musculoskeletal Assessment Tremor: None Bulk: Normal Tone: Normal Overweight  Gait Full gait assessment deferred  Posture Forward head/rounded shoulders in sitting and standing   Cervical Screen Spurlings A (ipsilateral lateral flexion/axial compression): R: Positive for reproduction of periscapular pain L: Positive Spurlings B (ipsilateral lateral flexion/contralateral rotation/axial compression): R: Positive for reproduction of periscapular pain L: Positive Repeated movement: Not performed Hoffman Sign (cervical cord compression): R: Negative L: Negative ULTT Median: R: Not examined L: Not examined ULTT Ulnar: R: Not examined L: Not examined ULTT Radial: R: Not examined L: Not examined   AROM  AROM  (Normal range in degrees) AROM 11/12/2021  Cervical  Flexion (50) 43  Extension (80) 23*  Right lateral flexion (45) 35*  Left lateral flexion (45) 35  Right rotation (85)   Left rotation (85)    Right Left  Shoulder    Flexion    Extension    Abduction    External Rotation    Internal Rotation  Hands Behind Head    Hands Behind Back        Elbow    Flexion    Extension    Pronation    Supination    (* = pain; Blank rows = not tested)    LE MMT:  MMT (out of 5) Right 11/12/2021 Left 11/12/2021  Cervical (isometric)  Flexion Weak  Extension Weak  Lateral Flexion Weak Weak  Rotation Weak Weak      Shoulder   Flexion 4 4  Extension    Abduction 4+ 4  External rotation 3+* 3+  Internal rotation 5 5  Horizontal abduction    Horizontal adduction    Lower Trapezius    Rhomboids        Elbow  Flexion 5 5  Extension 5 5  Pronation    Supination        Wrist  Flexion 5 5  Extension 5 5  Radial deviation    Ulnar deviation        MCP  Flexion 5 5  Extension 5 5  Abduction    Adduction    (* = pain; Blank rows = not tested)  Positive reproduction of concordant pain with resisted L shoulder ER  Sensation Deferred  Reflexes Deferred   Palpation  Location LEFT  RIGHT           Subocciptials  0  Cervical paraspinals  2  Upper Trapezius    Levator Scapulae    Rhomboid Major/Minor  2  Sternoclavicular joint    Acromioclavicular joint    Coracoid process    Long head of biceps  2  Supraspinatus    Infraspinatus    Subscapularis    Teres Minor    Teres Major    Pectoralis Major    Pectoralis Minor    Anterior Deltoid  1  Lateral Deltoid    Posterior Deltoid    Latissimus Dorsi    Sternocleidomastoid    (Blank rows = not tested) Graded on 0-4 scale (0 = no pain, 1 = pain, 2 = pain with wincing/grimacing/flinching, 3 = pain with withdrawal, 4 = unwilling to allow palpation), (Blank rows = not tested)   Repeated  Movements Deferred  Passive Accessory Intervertebral Motion Deferred   Accessory Motions/Glides Deferred   SPECIAL TESTS Rotator Cuff  Drop Arm Test: Negative Painful Arc (Pain from 60 to 120 degrees scaption): Negative Infraspinatus Muscle Test: Positive   Subacromial Impingement Deferred  Labral Tear Deferred  Bicep Tendon Pathology Deferred  Shoulder Instability Deferred   Beighton scale: Deferred  FROM 09/23/21 VISIT:  AROM  AROM (Normal range in degrees) AROM 09/23/2021  Cervical  Flexion (50) 43  Extension (80) 23*  Right lateral flexion (45) 35*  Left lateral flexion (45) 35  Right rotation (85)   Left rotation (85)    Right Left  Shoulder    Flexion 160* 160*  Extension    Abduction 145* 120*  External Rotation 60* 55*  Internal Rotation 86 85  Hands Behind Head    Hands Behind Back        Elbow    Flexion WNL   Extension WNL   Pronation    Supination    (* = pain; Blank rows = not tested)  Passive Accessory Intervertebral Motion Pt denies reproduction of shoulder pain with CPA C2-T7 and R UPA C2-T7 however he is tender centrally to pressure. Generally, hypomobile throughout however difficult to assess mobility secondary to pain;  Accessory Motions/Glides  Glenohumeral: Posterior: R: Painful, unable to fully assess L: not examined Inferior: R: abnormal, hypomobile and painful L: not examined Anterior: R: normal L: not examined  Acromioclavicular:  Posterior: R: Too painful to assess fully  L: not examined Anterior: R: Too painful to assess fully L: not examined  Sternoclavicular: Posterior: R: Too painful to assess fully L: not examined Anterior: R: Too painful to assess fully L: not examined Superior: R: Too painful to assess fully L: not examined Inferior: R: Too painful to assess fully L: not examined  Scapulothoracic: Painful in all directions so unable to fully assess mobility;   Muscle Length Testing Deferred   SPECIAL  TESTS (all on right side)  Subacromial Impingement Hawkins-Kennedy: Positive Neer (Block scapula, PROM flexion): Positive Painful Arc (Pain from 60 to 120 degrees scaption): Negative Empty Can: Positive External Rotation Resistance: Positive Horizontal Adduction: Negative Scapular Assist: Not examined  Labral Tear Biceps Load II (120 elevation, full ER, 90 elbow flexion, full supination, resisted elbow flexion): Negative Crank (160 scaption, axial load with IR/ER): Positive Active Compression Test: Negative  Bicep Tendon Pathology Speed (shoulder flexion to 90, external rotation, full elbow extension, and forearm supination with resistance: Negative Yergason's (resisted shoulder ER and supination/biceps tendon pathology): Not examined  Shoulder Instability Sulcus Sign: Positive Anterior Apprehension: Negative    TODAY'S TREATMENT   SUBJECTIVE: Pt reports that he is doing alright today. He has continued taking the Flexeril to help with his pain. He has not had any further vertigo or dizziness. No resting pain upon arrival. No specific questions or concerns currently.   PAIN: Denies at rest    Ther-ex  UBE x 5 minutes (2.5 forward/2.5 backwards) for warm-up, unbilled; Seated Nautilus lat pull down 80# x 15, 90# 2 x 15; Seated Nautilus rows with 60# x 9, 2 x 15; Standing Nautilus chest press 50# 3 x 15; Seated overhead shoulder press with 3# dumbbells 2 x 10; Seated R shoulder flexion with 3# dumbbell (DB) 2 x 15, (started with 4# but too heavy); Seated R shoulder scaption with 3# dumbbell (DB) 2 x 15;   Not performed today: Seated scapular retractions 3 x 10 with tactile cues for proper technique; Seated overhead shoulder press with 3# dumbbell in each hand 3 x 10; Seated "W's" with green tband 2 x 10; Standing Nautilus shoulder extension 40# 3 x 10; Supine R shoulder circles (CW and CCW) with 4# DB 2 x 10; Supine R shoulder serratus punch with 4# DB 2 x 10; L  sidelying R shoulder abduction with 4# DB x 10; L sidelying R shoulder ER with 4# DB 2 x 10;    PATIENT EDUCATION:  Education details: Pt educated throughout session about proper posture and technique with exercises. Improved exercise technique, movement at target joints, use of target muscles after min to mod verbal, visual, tactile cues. Person educated: Patient Education method: Explanation, verbal cues, tactile cues, and handout Education comprehension: verbalized understanding,    HOME EXERCISE PROGRAM: Access Code: [email or web address] URL: [email or web address] Date: 09/23/2021 Prepared by: Roxana Hires  Exercises - Seated Scapular Retraction  - 1 x daily - 7 x weekly - 2 sets - 10 reps - 2s hold - Standing Shoulder Row with Anchored Resistance  - 1 x daily - 7 x weekly - 2 sets - 10 reps - 2s hold - Shoulder W - External Rotation with Resistance  - 1 x daily - 7 x weekly - 2 sets - 10 reps - 2s hold  ASSESSMENT:  CLINICAL IMPRESSION:  Pt continues to report complete resolution of his vertigo/dizziness at this time. Continued with pain-free strengthening today. Attempted to increase dumbbell resistance however pt is unable to tolerate secondary to increase in pain. He has scheduled his thoracic MRI in a couple weeks. \ Pt encouraged to continue HEP and follow-up as scheduled. He will continue to benefit from PT services to address deficits in strength, pain, and range of motion in order to return to full function at home.   REHAB POTENTIAL: Fair secondary to multiple comorbidities and ongoing chronic neck pain  CLINICAL DECISION MAKING: Unstable/unpredictable  EVALUATION COMPLEXITY: High   GOALS: Goals reviewed with patient? Yes  SHORT TERM GOALS: Target date: 12/07/2021   Pt will be independent with HEP to improve strength and decrease shoulder pain to improve pain-free function at home and work. Baseline:  Goal status: ONGOING   LONG TERM GOALS: Target date:  01/04/2022   Pt will increase FOTO to at least 62 to demonstrate significant improvement in function at home and work related to neck pain  Baseline: 09/15/21: 50; 10/21/21: 53 Goal status: PARTIALLY MET  2.  Pt will decrease worst shoulder pain by at least 3 points on the NPRS in order to demonstrate clinically significant reduction in shoulder pain. Baseline: 09/15/21: worst: 7/10; 10/21/21: worst: 7/10; Goal status: ONGOING  3.  Pt will decrease quick DASH score by at least 8% in order to demonstrate clinically significant reduction in disability related to shoulder pain        Baseline: 09/15/21: 37.5%, 10/21/21: 45.5%  Goal status: ONGOING  4. Pt will increase strength of pain-free R shoulder external rotation to at least 4/5 in order to demonstrate improvement in strength and function of RUE       Baseline: 09/15/21: 3+/5 painful, 10/21/21: 4+/5 with minimal pain Goal status: PARTIALLY MET   PLAN: PT FREQUENCY: 1-2x/week  PT DURATION: 8 weeks  PLANNED INTERVENTIONS: Therapeutic exercises, Therapeutic activity, Neuromuscular re-education, Balance training, Gait training, Patient/Family education, Joint manipulation, Joint mobilization, Vestibular training, Canalith repositioning, Aquatic Therapy, Dry Needling, Electrical stimulation, Spinal manipulation, Spinal mobilization, Cryotherapy, Moist heat, Traction, Ultrasound, Ionotophoresis 68m/ml Dexamethasone, and Manual therapy  PLAN FOR NEXT SESSION:  continue manual techniques and strengthening (primary focus), review and modify HEP as needed  JLyndel SafeHuprich PT, DPT, GCS  Bobby Chen 11/12/2021, 8:38 AM

## 2021-11-13 ENCOUNTER — Ambulatory Visit
Admission: RE | Admit: 2021-11-13 | Discharge: 2021-11-13 | Disposition: A | Discharge status: Home / Self Care | Payer: Medicare Other | Source: Ambulatory Visit | Attending: Sports Medicine | Admitting: Sports Medicine

## 2021-11-13 DIAGNOSIS — M898X1 Other specified disorders of bone, shoulder: Secondary | ICD-10-CM | POA: Insufficient documentation

## 2021-11-13 DIAGNOSIS — G8929 Other chronic pain: Secondary | ICD-10-CM | POA: Insufficient documentation

## 2021-11-13 DIAGNOSIS — W19XXXD Unspecified fall, subsequent encounter: Secondary | ICD-10-CM | POA: Diagnosis not present

## 2021-11-13 DIAGNOSIS — M546 Pain in thoracic spine: Secondary | ICD-10-CM | POA: Insufficient documentation

## 2021-11-14 NOTE — Therapy (Signed)
OUTPATIENT PHYSICAL THERAPY SHOULDER TREATMENT   Patient Name: Bobby Chen MRN: [id number] DOB:December 21, 1945, 76 y.o., male Today's Date: 11/15/2021   PT End of Session - 11/15/21 0932     Visit Number 15    Number of Visits 33    Date for PT Re-Evaluation 01/04/22    Authorization Type eval: 4/70/96, recertification 2/83/66    PT Start Time 0935    PT Stop Time 1015    PT Time Calculation (min) 40 min    Activity Tolerance Patient tolerated treatment well    Behavior During Therapy [facility] for tasks assessed/performed               Past Medical History:  Diagnosis Date   Anginal pain ([place])    Brown recluse spider bite    Cancer ([place])    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, LEFT ARM    COPD (chronic obstructive pulmonary disease) ([place])    Diabetes mellitus without complication ([place])    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[place])    Elevated lipids    Hypertension    Obesity    Pulmonary arterial hypertension ([place])    Sleep apnea    Past Surgical History:  Procedure Laterality Date   ANKLE FRACTURE SURGERY     BASAL CELL CARCINOMA EXCISION N/A    scalp   COLONOSCOPY W/ POLYPECTOMY     COLONOSCOPY WITH PROPOFOL N/A 01/18/2016   Procedure: COLONOSCOPY WITH PROPOFOL;  Surgeon: Lollie Sails, MD;  Location: [facility] ENDOSCOPY;  Service: Endoscopy;  Laterality: N/A;   COLONOSCOPY WITH PROPOFOL N/A 07/02/2018   Procedure: COLONOSCOPY WITH PROPOFOL;  Surgeon: Lollie Sails, MD;  Location: [facility] ENDOSCOPY;  Service: Endoscopy;  Laterality: N/A;   ESOPHAGOGASTRODUODENOSCOPY (EGD) WITH PROPOFOL N/A 01/18/2016   Procedure: ESOPHAGOGASTRODUODENOSCOPY (EGD) WITH PROPOFOL;  Surgeon: Lollie Sails, MD;  Location: [facility] ENDOSCOPY;  Service: Endoscopy;  Laterality: N/A;   FRACTURE SURGERY Right 1984   ankle   HERNIA REPAIR     UMBILICAL   LEG SURGERY Left    NASAL SEPTOPLASTY W/ TURBINOPLASTY Bilateral 11/06/2019   Procedure: NASAL SEPTOPLASTY WITH INFERIOR TURBINATE REDUCTION;  Surgeon: Margaretha Sheffield, MD;   Location: [facility] ORS;  Service: ENT;  Laterality: Bilateral;   toenail removal Bilateral    Patient Active Problem List   Diagnosis Date Noted   Pain in joint, ankle and foot 10/19/2021   Lymphedema 10/19/2021   Spinal stenosis of cervical region 08/26/2021   Bilateral hearing loss 04/10/2020   Difficulty walking 04/10/2020   Sensory ataxia 04/10/2020   B12 deficiency 02/03/2020   Leg pain, right 08/26/2019   Chronic venous insufficiency 08/26/2019   Venous insufficiency 08/26/2019   PAD (peripheral artery disease) ([place]) 08/14/2019   PVD (peripheral vascular disease) ([place]) 08/14/2019   Obstructive sleep apnea 05/06/2019   Obesity, Class II, BMI 35-39.9 03/11/2019   Type 2 diabetes mellitus with stage 3a chronic kidney disease, without long-term current use of insulin ([place]) 03/11/2019   Pain in joint involving ankle and foot 11/12/2018   Nuclear sclerotic cataract of both eyes 08/30/2018   Abnormal sensation in left ear 08/20/2018   Dizziness 08/20/2018   Numbness and tingling of left side of face 08/20/2018   Essential hypertension with goal blood pressure less than 130/80 03/19/2018   Healthcare maintenance 08/07/2017   Brown recluse spider bite 05/08/2017   SCC (squamous cell carcinoma), 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/2015   Severe obesity (BMI 35.0-35.9 with comorbidity) ([place]) 06/01/2015   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[place]) 09/29/2013   Hyperlipidemia associated with type 2  diabetes mellitus ([place]) 09/29/2013    PCP: Kirk Ruths, MD  REFERRING PROVIDER: Diamond Nickel, DO  REFERRING DIAGNOSIS: M25.511 (ICD-10-CM) - Pain in right shoulder W19.XXXA (ICD-10-CM) - Unspecified fall, initial encounter [phone] (ICD-10-CM) - Other muscle spasm M75.41 (ICD-10-CM) - Impingement syndrome of right shoulder   THERAPY DIAG: Right shoulder pain, unspecified chronicity  RATIONALE FOR EVALUATION AND TREATMENT: Rehabilitation  ONSET DATE: 06/23/21 (approximate)  FOLLOW UP APPT WITH PROVIDER: Yes,  returns in June 2023 to see [name]   From initial evaluation  SUBJECTIVE:                                                                                                                                                                                         Chief Complaint: R shoulder pain  Pertinent History Pt referred to PT for R shoulder pain by orthopedics. His pain began after a fall at the end of March 2023. He was walking down steps and started leaning forward. He responded by leaning back and he fell backwards bracing himself with his arms. He experienced immediate R shoulder pain. He denies any additional falls in the last 6 months but he did have a fall about 1 year ago. He had an evaluation of his R shoulder pain at his PCP office. Plain films of his thoracic spine and ribs were obtained which did not show any rib fractures but multilevel disc changes in his thoracic spine. He reports that prior to his fall he was receiving physical therapy for his cervical stenosis with notable improvement in his pain and mobility. He was last evaluated by physiatry on 08/26/2021 for his neck pain that radiates into his right arm. This was suspected to be due to his nerve root impingement seen on recent cervical spine MRI. There is also question of carpal tunnel syndrome. He was recommended EMG of the bilateral upper extremities and epidural steroid injection to his cervical spine. Pt states that [facility] PT told him there were "unable to see him" for his R shoulder pain. Since his fall he has had more pain along his posterior right shoulder. The pain is "under my shoulder blade" and it radiates around his right trunk. He points to his lateral R ribs, scapula, and periscapular area mostly.  He denies associated swelling, shoulder locking/catching, shoulder instability, weakness, fevers or chills, night sweats, weight loss, skin color change. He has tried acetaminophen, [place] dream cream,  activity modification, chronic medication nortriptyline/pregabalin/tramadol with continued symptoms. He is right hand dominant and is retired from being a Art gallery manager. He also was previously in the [employer]   Pain:  Pain Intensity: Present: 0/10,  Best: 0/10, Worst: 7/10 Pain location: R thoracic paraspinal/periscapular region Pain Quality: dull  Radiating: Yes, around the R ribs  Numbness/Tingling: No, He reports questionable minor numbness around R ribs Focal Weakness: No, not related to current R shoulder pain Aggravating factors: leaning to the left, standing forward from a chair, bending forward "makes me feel drunk," reaching out with RUE; Relieving factors: Tylenol, topical analgesic provides temporary relief, no help with ice/heat,  24-hour pain behavior: "All day long" History of prior shoulder or neck/shoulder injury, pain, surgery, or therapy: Yes, see history Falls: Has patient fallen in last 6 months? Yes, Number of falls: 1 Dominant hand: right Imaging: Yes, Right Shoulder Radiographs - 3 views (Grashey, Axillary, Scapular Y) performed 08/31/2021: Type I acromion. Mild AC joint degenerative change with osteophyte formation. The distal clavicle, scapula, and proximal humerus are intact. Glenohumeral and acromioclavicular joint alignment appears normal. No fractures or dislocations. Well maintained glenohumeral joint space without evidence of degenerative joint disease. No soft tissue swelling or joint effusion. No destructive bony lesion is identified.  Cervical spine MRI without contrast performed 04/15/2021 IMPRESSION: Cervical spondylosis, as outlined and with findings most notably as follows: At C5-C6, a moderate-sized central disc protrusion contributes to multifactorial severe spinal canal stenosis with moderate spinal cord flattening. No definite spinal cord signal abnormality is identified, although motion degradation limits evaluation. Multifactorial bilateral neural foraminal  narrowing (moderate right, severe left). No more than mild relative spinal canal narrowing at the remaining levels. Of note, a right center disc protrusion contacts the ventral spinal cord at C6-C7. Additional sites of foraminal stenosis, as detailed and greatest on the left at C3-C4 (moderate) and on the right at C6-C7 (moderate).    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  Recent memory is intact.  Remote memory is intact.  Attention span and concentration are intact.  Expressive speech is intact.  Patient's fund of knowledge is within normal limits for educational level. Conversation is tangential with frequent diversions     Gross Musculoskeletal Assessment Tremor: None Bulk: Normal Tone: Normal Overweight  Gait Full gait assessment deferred  Posture Forward head/rounded shoulders in sitting and standing   Cervical Screen Spurlings A (ipsilateral lateral flexion/axial compression): R: Positive for reproduction of periscapular pain L: Positive Spurlings B (ipsilateral lateral flexion/contralateral rotation/axial compression): R: Positive for reproduction of periscapular pain L: Positive Repeated movement: Not performed Hoffman Sign (cervical cord compression): R: Negative L: Negative ULTT Median: R: Not examined L: Not examined ULTT Ulnar: R: Not examined L: Not examined ULTT Radial: R: Not examined L: Not examined   AROM  AROM  (Normal range in degrees) AROM 11/15/2021  Cervical  Flexion (50) 43  Extension (80) 23*  Right lateral flexion (45) 35*  Left lateral flexion (45) 35  Right rotation (85)   Left rotation (85)    Right Left  Shoulder    Flexion    Extension    Abduction    External Rotation    Internal Rotation  Hands Behind Head    Hands Behind Back        Elbow    Flexion    Extension    Pronation    Supination    (* = pain; Blank rows = not tested)    LE MMT:  MMT (out of 5) Right 11/15/2021 Left 11/15/2021  Cervical (isometric)  Flexion Weak  Extension Weak  Lateral Flexion Weak Weak  Rotation Weak Weak      Shoulder   Flexion 4 4  Extension    Abduction 4+ 4  External rotation 3+* 3+  Internal rotation 5 5  Horizontal abduction    Horizontal adduction    Lower Trapezius    Rhomboids        Elbow  Flexion 5 5  Extension 5 5  Pronation    Supination        Wrist  Flexion 5 5  Extension 5 5  Radial deviation    Ulnar deviation        MCP  Flexion 5 5  Extension 5 5  Abduction    Adduction    (* = pain; Blank rows = not tested)  Positive reproduction of concordant pain with resisted L shoulder ER  Sensation Deferred  Reflexes Deferred   Palpation  Location LEFT  RIGHT           Subocciptials  0  Cervical paraspinals  2  Upper Trapezius    Levator Scapulae    Rhomboid Major/Minor  2  Sternoclavicular joint    Acromioclavicular joint    Coracoid process    Long head of biceps  2  Supraspinatus    Infraspinatus    Subscapularis    Teres Minor    Teres Major    Pectoralis Major    Pectoralis Minor    Anterior Deltoid  1  Lateral Deltoid    Posterior Deltoid    Latissimus Dorsi    Sternocleidomastoid    (Blank rows = not tested) Graded on 0-4 scale (0 = no pain, 1 = pain, 2 = pain with wincing/grimacing/flinching, 3 = pain with withdrawal, 4 = unwilling to allow palpation), (Blank rows = not tested)   Repeated  Movements Deferred  Passive Accessory Intervertebral Motion Deferred   Accessory Motions/Glides Deferred   SPECIAL TESTS Rotator Cuff  Drop Arm Test: Negative Painful Arc (Pain from 60 to 120 degrees scaption): Negative Infraspinatus Muscle Test: Positive   Subacromial Impingement Deferred  Labral Tear Deferred  Bicep Tendon Pathology Deferred  Shoulder Instability Deferred   Beighton scale: Deferred  FROM 09/23/21 VISIT:  AROM  AROM (Normal range in degrees) AROM 09/23/2021  Cervical  Flexion (50) 43  Extension (80) 23*  Right lateral flexion (45) 35*  Left lateral flexion (45) 35  Right rotation (85)   Left rotation (85)    Right Left  Shoulder    Flexion 160* 160*  Extension    Abduction 145* 120*  External Rotation 60* 55*  Internal Rotation 86 85  Hands Behind Head    Hands Behind Back        Elbow    Flexion WNL   Extension WNL   Pronation    Supination    (* = pain; Blank rows = not tested)  Passive Accessory Intervertebral Motion Pt denies reproduction of shoulder pain with CPA C2-T7 and R UPA C2-T7 however he is tender centrally to pressure. Generally, hypomobile throughout however difficult to assess mobility secondary to pain;  Accessory Motions/Glides  Glenohumeral: Posterior: R: Painful, unable to fully assess L: not examined Inferior: R: abnormal, hypomobile and painful L: not examined Anterior: R: normal L: not examined  Acromioclavicular:  Posterior: R: Too painful to assess fully  L: not examined Anterior: R: Too painful to assess fully L: not examined  Sternoclavicular: Posterior: R: Too painful to assess fully L: not examined Anterior: R: Too painful to assess fully L: not examined Superior: R: Too painful to assess fully L: not examined Inferior: R: Too painful to assess fully L: not examined  Scapulothoracic: Painful in all directions so unable to fully assess mobility;   Muscle Length Testing Deferred   SPECIAL  TESTS (all on right side)  Subacromial Impingement Hawkins-Kennedy: Positive Neer (Block scapula, PROM flexion): Positive Painful Arc (Pain from 60 to 120 degrees scaption): Negative Empty Can: Positive External Rotation Resistance: Positive Horizontal Adduction: Negative Scapular Assist: Not examined  Labral Tear Biceps Load II (120 elevation, full ER, 90 elbow flexion, full supination, resisted elbow flexion): Negative Crank (160 scaption, axial load with IR/ER): Positive Active Compression Test: Negative  Bicep Tendon Pathology Speed (shoulder flexion to 90, external rotation, full elbow extension, and forearm supination with resistance: Negative Yergason's (resisted shoulder ER and supination/biceps tendon pathology): Not examined  Shoulder Instability Sulcus Sign: Positive Anterior Apprehension: Negative    TODAY'S TREATMENT   SUBJECTIVE: Pt reports that he is doing alright today. He has continued taking the Flexeril to help with his pain and had to take 2 after the last therapy session due to increased soreness. He had the thoracic MRI over the weekend but has not received the results. No resting pain upon arrival. No specific questions or concerns currently.   PAIN: Denies at rest    Ther-ex  UBE x 5 minutes (2.5 forward/2.5 backwards) for warm-up, unbilled; Supine R shoulder flexion with 5# dumbbell (DB) 2 x 10; Supine R shoulder circles (CW and CCW) with 5# DB 2 x 10; Supine R shoulder serratus punch with 5# DB 2 x 10; L sidelying R shoulder abduction with 5# DB x 10; L sidelying R shoulder ER with 3# DB 2 x 10; Seated Nautilus lat pull down 95# 3 x 15; Seated Nautilus rows with 60# 3 x 15; Seated scapular retractions 3 x 10 with tactile cues for proper technique; Standing Nautilus chest press 50# 3 x 15;   Not performed today: Seated overhead shoulder press with 3# dumbbell in each hand 3 x 10; Seated "W's" with green tband 2 x 10; Standing Nautilus  shoulder extension 40# 3 x 10; Seated R shoulder scaption with 3# dumbbell (DB) 2 x 15;   PATIENT EDUCATION:  Education details: Pt educated throughout session about proper posture and technique with exercises. Improved exercise technique, movement at target joints, use of target muscles after min to mod verbal, visual, tactile cues. Person educated: Patient Education method: Explanation, verbal cues, tactile cues, and handout Education comprehension: verbalized understanding,    HOME EXERCISE PROGRAM: Access Code: [email or web address] URL: [email or web address] Date: 09/23/2021 Prepared by: Roxana Hires  Exercises - Seated Scapular Retraction  - 1 x daily - 7 x weekly - 2 sets - 10 reps - 2s hold - Standing Shoulder Row with Anchored Resistance  - 1 x daily - 7 x weekly - 2 sets - 10 reps - 2s hold - Shoulder W - External Rotation with Resistance  - 1 x daily - 7 x weekly - 2 sets - 10 reps - 2s hold   ASSESSMENT:  CLINICAL  IMPRESSION:   Continued with pain-free strengthening today. Increased dumbbell resistance today and pt is able to complete without pain. Pt advised the MRI showed some spurs and encouraged him to follow-up with Dr. Candelaria Stagers for the results and to define the plan of care. Pt encouraged to continue HEP and follow-up as scheduled. He will continue to benefit from PT services to address deficits in strength, pain, and range of motion in order to return to full function at home.   REHAB POTENTIAL: Fair secondary to multiple comorbidities and ongoing chronic neck pain  CLINICAL DECISION MAKING: Unstable/unpredictable  EVALUATION COMPLEXITY: High   GOALS: Goals reviewed with patient? Yes  SHORT TERM GOALS: Target date: 12/07/2021   Pt will be independent with HEP to improve strength and decrease shoulder pain to improve pain-free function at home and work. Baseline:  Goal status: ONGOING   LONG TERM GOALS: Target date: 01/04/2022   Pt will increase FOTO to  at least 62 to demonstrate significant improvement in function at home and work related to neck pain  Baseline: 09/15/21: 50; 10/21/21: 53 Goal status: PARTIALLY MET  2.  Pt will decrease worst shoulder pain by at least 3 points on the NPRS in order to demonstrate clinically significant reduction in shoulder pain. Baseline: 09/15/21: worst: 7/10; 10/21/21: worst: 7/10; Goal status: ONGOING  3.  Pt will decrease quick DASH score by at least 8% in order to demonstrate clinically significant reduction in disability related to shoulder pain        Baseline: 09/15/21: 37.5%, 10/21/21: 45.5%  Goal status: ONGOING  4. Pt will increase strength of pain-free R shoulder external rotation to at least 4/5 in order to demonstrate improvement in strength and function of RUE       Baseline: 09/15/21: 3+/5 painful, 10/21/21: 4+/5 with minimal pain Goal status: PARTIALLY MET   PLAN: PT FREQUENCY: 1-2x/week  PT DURATION: 8 weeks  PLANNED INTERVENTIONS: Therapeutic exercises, Therapeutic activity, Neuromuscular re-education, Balance training, Gait training, Patient/Family education, Joint manipulation, Joint mobilization, Vestibular training, Canalith repositioning, Aquatic Therapy, Dry Needling, Electrical stimulation, Spinal manipulation, Spinal mobilization, Cryotherapy, Moist heat, Traction, Ultrasound, Ionotophoresis 4mg /ml Dexamethasone, and Manual therapy  PLAN FOR NEXT SESSION:  continue manual techniques and strengthening (primary focus), review and modify HEP as needed  Lyndel Safe Tanvir Hipple PT, DPT, GCS  Alecsander Hattabaugh 11/15/2021, 11:16 AM

## 2021-11-15 ENCOUNTER — Ambulatory Visit: Payer: Medicare Other

## 2021-11-15 DIAGNOSIS — M25511 Pain in right shoulder: Secondary | ICD-10-CM

## 2021-11-17 ENCOUNTER — Ambulatory Visit: Payer: Medicare Other

## 2021-11-18 ENCOUNTER — Ambulatory Visit: Payer: Medicare Other

## 2021-11-18 DIAGNOSIS — M25511 Pain in right shoulder: Secondary | ICD-10-CM

## 2021-11-18 NOTE — Therapy (Signed)
OUTPATIENT PHYSICAL THERAPY SHOULDER TREATMENT   Patient Name: Bobby Chen MRN: [id number] DOB:01-01-1946, 76 y.o., male Today's Date: 11/18/2021   PT End of Session - 11/18/21 1120     Visit Number 16    Number of Visits 33    Date for PT Re-Evaluation 01/04/22    Authorization Type eval: 2/42/35, recertification 3/61/44    PT Start Time 1130    PT Stop Time 1215    PT Time Calculation (min) 45 min    Activity Tolerance Patient tolerated treatment well    Behavior During Therapy [facility] for tasks assessed/performed               Past Medical History:  Diagnosis Date   Anginal pain ([place])    Brown recluse spider bite    Cancer ([place])    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, LEFT ARM    COPD (chronic obstructive pulmonary disease) ([place])    Diabetes mellitus without complication ([place])    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[place])    Elevated lipids    Hypertension    Obesity    Pulmonary arterial hypertension ([place])    Sleep apnea    Past Surgical History:  Procedure Laterality Date   ANKLE FRACTURE SURGERY     BASAL CELL CARCINOMA EXCISION N/A    scalp   COLONOSCOPY W/ POLYPECTOMY     COLONOSCOPY WITH PROPOFOL N/A 01/18/2016   Procedure: COLONOSCOPY WITH PROPOFOL;  Surgeon: Lollie Sails, MD;  Location: [facility] ENDOSCOPY;  Service: Endoscopy;  Laterality: N/A;   COLONOSCOPY WITH PROPOFOL N/A 07/02/2018   Procedure: COLONOSCOPY WITH PROPOFOL;  Surgeon: Lollie Sails, MD;  Location: [facility] ENDOSCOPY;  Service: Endoscopy;  Laterality: N/A;   ESOPHAGOGASTRODUODENOSCOPY (EGD) WITH PROPOFOL N/A 01/18/2016   Procedure: ESOPHAGOGASTRODUODENOSCOPY (EGD) WITH PROPOFOL;  Surgeon: Lollie Sails, MD;  Location: [facility] ENDOSCOPY;  Service: Endoscopy;  Laterality: N/A;   FRACTURE SURGERY Right 1984   ankle   HERNIA REPAIR     UMBILICAL   LEG SURGERY Left    NASAL SEPTOPLASTY W/ TURBINOPLASTY Bilateral 11/06/2019   Procedure: NASAL SEPTOPLASTY WITH INFERIOR TURBINATE REDUCTION;  Surgeon: Margaretha Sheffield, MD;   Location: [facility] ORS;  Service: ENT;  Laterality: Bilateral;   toenail removal Bilateral    Patient Active Problem List   Diagnosis Date Noted   Pain in joint, ankle and foot 10/19/2021   Lymphedema 10/19/2021   Spinal stenosis of cervical region 08/26/2021   Bilateral hearing loss 04/10/2020   Difficulty walking 04/10/2020   Sensory ataxia 04/10/2020   B12 deficiency 02/03/2020   Leg pain, right 08/26/2019   Chronic venous insufficiency 08/26/2019   Venous insufficiency 08/26/2019   PAD (peripheral artery disease) ([place]) 08/14/2019   PVD (peripheral vascular disease) ([place]) 08/14/2019   Obstructive sleep apnea 05/06/2019   Obesity, Class II, BMI 35-39.9 03/11/2019   Type 2 diabetes mellitus with stage 3a chronic kidney disease, without long-term current use of insulin ([place]) 03/11/2019   Pain in joint involving ankle and foot 11/12/2018   Nuclear sclerotic cataract of both eyes 08/30/2018   Abnormal sensation in left ear 08/20/2018   Dizziness 08/20/2018   Numbness and tingling of left side of face 08/20/2018   Essential hypertension with goal blood pressure less than 130/80 03/19/2018   Healthcare maintenance 08/07/2017   Brown recluse spider bite 05/08/2017   SCC (squamous cell carcinoma), 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/2015   Severe obesity (BMI 35.0-35.9 with comorbidity) ([place]) 06/01/2015   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[place]) 09/29/2013   Hyperlipidemia associated with type 2  diabetes mellitus ([place]) 09/29/2013    PCP: Kirk Ruths, MD  REFERRING PROVIDER: Diamond Nickel, DO  REFERRING DIAGNOSIS: M25.511 (ICD-10-CM) - Pain in right shoulder W19.XXXA (ICD-10-CM) - Unspecified fall, initial encounter [phone] (ICD-10-CM) - Other muscle spasm M75.41 (ICD-10-CM) - Impingement syndrome of right shoulder   THERAPY DIAG: Right shoulder pain, unspecified chronicity  RATIONALE FOR EVALUATION AND TREATMENT: Rehabilitation  ONSET DATE: 06/23/21 (approximate)  FOLLOW UP APPT WITH PROVIDER: Yes,  returns in June 2023 to see [name]   From initial evaluation  SUBJECTIVE:                                                                                                                                                                                         Chief Complaint: R shoulder pain  Pertinent History Pt referred to PT for R shoulder pain by orthopedics. His pain began after a fall at the end of March 2023. He was walking down steps and started leaning forward. He responded by leaning back and he fell backwards bracing himself with his arms. He experienced immediate R shoulder pain. He denies any additional falls in the last 6 months but he did have a fall about 1 year ago. He had an evaluation of his R shoulder pain at his PCP office. Plain films of his thoracic spine and ribs were obtained which did not show any rib fractures but multilevel disc changes in his thoracic spine. He reports that prior to his fall he was receiving physical therapy for his cervical stenosis with notable improvement in his pain and mobility. He was last evaluated by physiatry on 08/26/2021 for his neck pain that radiates into his right arm. This was suspected to be due to his nerve root impingement seen on recent cervical spine MRI. There is also question of carpal tunnel syndrome. He was recommended EMG of the bilateral upper extremities and epidural steroid injection to his cervical spine. Pt states that [facility] PT told him there were "unable to see him" for his R shoulder pain. Since his fall he has had more pain along his posterior right shoulder. The pain is "under my shoulder blade" and it radiates around his right trunk. He points to his lateral R ribs, scapula, and periscapular area mostly.  He denies associated swelling, shoulder locking/catching, shoulder instability, weakness, fevers or chills, night sweats, weight loss, skin color change. He has tried acetaminophen, [place] dream cream,  activity modification, chronic medication nortriptyline/pregabalin/tramadol with continued symptoms. He is right hand dominant and is retired from being a Art gallery manager. He also was previously in the [employer]   Pain:  Pain Intensity: Present: 0/10,  Best: 0/10, Worst: 7/10 Pain location: R thoracic paraspinal/periscapular region Pain Quality: dull  Radiating: Yes, around the R ribs  Numbness/Tingling: No, He reports questionable minor numbness around R ribs Focal Weakness: No, not related to current R shoulder pain Aggravating factors: leaning to the left, standing forward from a chair, bending forward "makes me feel drunk," reaching out with RUE; Relieving factors: Tylenol, topical analgesic provides temporary relief, no help with ice/heat,  24-hour pain behavior: "All day long" History of prior shoulder or neck/shoulder injury, pain, surgery, or therapy: Yes, see history Falls: Has patient fallen in last 6 months? Yes, Number of falls: 1 Dominant hand: right Imaging: Yes, Right Shoulder Radiographs - 3 views (Grashey, Axillary, Scapular Y) performed 08/31/2021: Type I acromion. Mild AC joint degenerative change with osteophyte formation. The distal clavicle, scapula, and proximal humerus are intact. Glenohumeral and acromioclavicular joint alignment appears normal. No fractures or dislocations. Well maintained glenohumeral joint space without evidence of degenerative joint disease. No soft tissue swelling or joint effusion. No destructive bony lesion is identified.  Cervical spine MRI without contrast performed 04/15/2021 IMPRESSION: Cervical spondylosis, as outlined and with findings most notably as follows: At C5-C6, a moderate-sized central disc protrusion contributes to multifactorial severe spinal canal stenosis with moderate spinal cord flattening. No definite spinal cord signal abnormality is identified, although motion degradation limits evaluation. Multifactorial bilateral neural foraminal  narrowing (moderate right, severe left). No more than mild relative spinal canal narrowing at the remaining levels. Of note, a right center disc protrusion contacts the ventral spinal cord at C6-C7. Additional sites of foraminal stenosis, as detailed and greatest on the left at C3-C4 (moderate) and on the right at C6-C7 (moderate).    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  Recent memory is intact.  Remote memory is intact.  Attention span and concentration are intact.  Expressive speech is intact.  Patient's fund of knowledge is within normal limits for educational level. Conversation is tangential with frequent diversions     Gross Musculoskeletal Assessment Tremor: None Bulk: Normal Tone: Normal Overweight  Gait Full gait assessment deferred  Posture Forward head/rounded shoulders in sitting and standing   Cervical Screen Spurlings A (ipsilateral lateral flexion/axial compression): R: Positive for reproduction of periscapular pain L: Positive Spurlings B (ipsilateral lateral flexion/contralateral rotation/axial compression): R: Positive for reproduction of periscapular pain L: Positive Repeated movement: Not performed Hoffman Sign (cervical cord compression): R: Negative L: Negative ULTT Median: R: Not examined L: Not examined ULTT Ulnar: R: Not examined L: Not examined ULTT Radial: R: Not examined L: Not examined   AROM  AROM  (Normal range in degrees) AROM 11/18/2021  Cervical  Flexion (50) 43  Extension (80) 23*  Right lateral flexion (45) 35*  Left lateral flexion (45) 35  Right rotation (85)   Left rotation (85)    Right Left  Shoulder    Flexion    Extension    Abduction    External Rotation    Internal Rotation  Hands Behind Head    Hands Behind Back        Elbow    Flexion    Extension    Pronation    Supination    (* = pain; Blank rows = not tested)    LE MMT:  MMT (out of 5) Right 11/18/2021 Left 11/18/2021  Cervical (isometric)  Flexion Weak  Extension Weak  Lateral Flexion Weak Weak  Rotation Weak Weak      Shoulder   Flexion 4 4  Extension    Abduction 4+ 4  External rotation 3+* 3+  Internal rotation 5 5  Horizontal abduction    Horizontal adduction    Lower Trapezius    Rhomboids        Elbow  Flexion 5 5  Extension 5 5  Pronation    Supination        Wrist  Flexion 5 5  Extension 5 5  Radial deviation    Ulnar deviation        MCP  Flexion 5 5  Extension 5 5  Abduction    Adduction    (* = pain; Blank rows = not tested)  Positive reproduction of concordant pain with resisted L shoulder ER  Sensation Deferred  Reflexes Deferred   Palpation  Location LEFT  RIGHT           Subocciptials  0  Cervical paraspinals  2  Upper Trapezius    Levator Scapulae    Rhomboid Major/Minor  2  Sternoclavicular joint    Acromioclavicular joint    Coracoid process    Long head of biceps  2  Supraspinatus    Infraspinatus    Subscapularis    Teres Minor    Teres Major    Pectoralis Major    Pectoralis Minor    Anterior Deltoid  1  Lateral Deltoid    Posterior Deltoid    Latissimus Dorsi    Sternocleidomastoid    (Blank rows = not tested) Graded on 0-4 scale (0 = no pain, 1 = pain, 2 = pain with wincing/grimacing/flinching, 3 = pain with withdrawal, 4 = unwilling to allow palpation), (Blank rows = not tested)   Repeated  Movements Deferred  Passive Accessory Intervertebral Motion Deferred   Accessory Motions/Glides Deferred   SPECIAL TESTS Rotator Cuff  Drop Arm Test: Negative Painful Arc (Pain from 60 to 120 degrees scaption): Negative Infraspinatus Muscle Test: Positive   Subacromial Impingement Deferred  Labral Tear Deferred  Bicep Tendon Pathology Deferred  Shoulder Instability Deferred   Beighton scale: Deferred  FROM 09/23/21 VISIT:  AROM  AROM (Normal range in degrees) AROM 09/23/2021  Cervical  Flexion (50) 43  Extension (80) 23*  Right lateral flexion (45) 35*  Left lateral flexion (45) 35  Right rotation (85)   Left rotation (85)    Right Left  Shoulder    Flexion 160* 160*  Extension    Abduction 145* 120*  External Rotation 60* 55*  Internal Rotation 86 85  Hands Behind Head    Hands Behind Back        Elbow    Flexion WNL   Extension WNL   Pronation    Supination    (* = pain; Blank rows = not tested)  Passive Accessory Intervertebral Motion Pt denies reproduction of shoulder pain with CPA C2-T7 and R UPA C2-T7 however he is tender centrally to pressure. Generally, hypomobile throughout however difficult to assess mobility secondary to pain;  Accessory Motions/Glides  Glenohumeral: Posterior: R: Painful, unable to fully assess L: not examined Inferior: R: abnormal, hypomobile and painful L: not examined Anterior: R: normal L: not examined  Acromioclavicular:  Posterior: R: Too painful to assess fully  L: not examined Anterior: R: Too painful to assess fully L: not examined  Sternoclavicular: Posterior: R: Too painful to assess fully L: not examined Anterior: R: Too painful to assess fully L: not examined Superior: R: Too painful to assess fully L: not examined Inferior: R: Too painful to assess fully L: not examined  Scapulothoracic: Painful in all directions so unable to fully assess mobility;   Muscle Length Testing Deferred   SPECIAL  TESTS (all on right side)  Subacromial Impingement Hawkins-Kennedy: Positive Neer (Block scapula, PROM flexion): Positive Painful Arc (Pain from 60 to 120 degrees scaption): Negative Empty Can: Positive External Rotation Resistance: Positive Horizontal Adduction: Negative Scapular Assist: Not examined  Labral Tear Biceps Load II (120 elevation, full ER, 90 elbow flexion, full supination, resisted elbow flexion): Negative Crank (160 scaption, axial load with IR/ER): Positive Active Compression Test: Negative  Bicep Tendon Pathology Speed (shoulder flexion to 90, external rotation, full elbow extension, and forearm supination with resistance: Negative Yergason's (resisted shoulder ER and supination/biceps tendon pathology): Not examined  Shoulder Instability Sulcus Sign: Positive Anterior Apprehension: Negative    TODAY'S TREATMENT   SUBJECTIVE: Pt reports that he is doing alright today. He has continued taking the Flexeril to help with his pain. Denies pain upon arrival today. He saw Dr. Melrose Nakayama yesterday who increased his Nortriptyline. Pt reports that Dr. Cari Caraway ordered another thoracic MRI. No specific questions or concerns currently.   PAIN: Denies at rest    Ther-ex  UBE x 5 minutes (2.5 forward/2.5 backwards) for warm-up, unbilled; Supine R shoulder flexion with 5# dumbbell (DB) 3 x 10; Supine R shoulder circles (CW and CCW) with 5# DB 3 x 10 each direction; Supine R shoulder serratus punch with 5# DB 3 x 10; L sidelying R shoulder abduction with 5# DB 3 x 10; L sidelying R shoulder ER with 5# DB 3 x 10; Seated shoulder flexion with 3# DB 3 x 10 BUE; Seated shoulder scaption with 3# DB 3 x 10 BUE; Attempted seated overhead shoulder press but discontinued secondary to pain; Seated "W's" with green tband 2 x 10; Seated Nautilus lat pull down 95# 2 x 25, 110# x 10;   Not performed today: Standing Nautilus shoulder extension 40# 3 x 10; Seated Nautilus rows with  60# 3 x 15; Seated scapular retractions 3 x 10 with tactile cues for proper technique; Standing Nautilus chest press 50# 3 x 15;   PATIENT EDUCATION:  Education details: Pt educated throughout session about proper posture and technique with exercises. Improved exercise technique, movement at target joints, use of target muscles after min to mod verbal, visual, tactile cues. Person educated: Patient Education method: Explanation, verbal cues, tactile cues, and handout Education comprehension: verbalized understanding,    HOME EXERCISE PROGRAM: Access Code: [email or web address] URL: [email or web address] Date: 09/23/2021 Prepared by: Roxana Hires  Exercises - Seated Scapular Retraction  - 1 x daily - 7 x weekly - 2 sets - 10 reps - 2s hold - Standing Shoulder Row with Anchored Resistance  - 1 x daily - 7 x weekly - 2 sets - 10 reps - 2s hold - Shoulder W - External Rotation with Resistance  - 1 x daily - 7 x weekly - 2 sets - 10 reps - 2s hold   ASSESSMENT:  CLINICAL IMPRESSION:   Continued with pain-free strengthening today. Increased to three sets for all dumbbell exercises on mat table. Added overhead ball rolls on wall today. Continued with Nautilus strengthening for high resistance exercises. Pt encouraged to continue HEP and follow-up as scheduled. He will continue to benefit from PT services to address deficits in strength, pain, and range of motion in order to return to full function at home.   REHAB POTENTIAL: Fair secondary to multiple comorbidities and ongoing chronic neck pain  CLINICAL DECISION MAKING: Unstable/unpredictable  EVALUATION COMPLEXITY: High   GOALS: Goals reviewed with patient? Yes  SHORT TERM GOALS: Target date: 12/07/2021   Pt will be independent with HEP to improve strength and decrease shoulder pain to improve pain-free function at home and work. Baseline:  Goal status: ONGOING   LONG TERM GOALS: Target date: 01/04/2022   Pt will increase  FOTO to at least 62 to demonstrate significant improvement in function at home and work related to neck pain  Baseline: 09/15/21: 50; 10/21/21: 53 Goal status: PARTIALLY MET  2.  Pt will decrease worst shoulder pain by at least 3 points on the NPRS in order to demonstrate clinically significant reduction in shoulder pain. Baseline: 09/15/21: worst: 7/10; 10/21/21: worst: 7/10; Goal status: ONGOING  3.  Pt will decrease quick DASH score by at least 8% in order to demonstrate clinically significant reduction in disability related to shoulder pain        Baseline: 09/15/21: 37.5%, 10/21/21: 45.5%  Goal status: ONGOING  4. Pt will increase strength of pain-free R shoulder external rotation to at least 4/5 in order to demonstrate improvement in strength and function of RUE       Baseline: 09/15/21: 3+/5 painful, 10/21/21: 4+/5 with minimal pain Goal status: PARTIALLY MET   PLAN: PT FREQUENCY: 1-2x/week  PT DURATION: 8 weeks  PLANNED INTERVENTIONS: Therapeutic exercises, Therapeutic activity, Neuromuscular re-education, Balance training, Gait training, Patient/Family education, Joint manipulation, Joint mobilization, Vestibular training, Canalith repositioning, Aquatic Therapy, Dry Needling, Electrical stimulation, Spinal manipulation, Spinal mobilization, Cryotherapy, Moist heat, Traction, Ultrasound, Ionotophoresis 81m/ml Dexamethasone, and Manual therapy  PLAN FOR NEXT SESSION:  continue strengthening , review and modify HEP as needed  JLyndel SafeHuprich PT, DPT, GCS  Marybel Alcott 11/18/2021, 1:29 PM

## 2021-11-22 NOTE — Therapy (Signed)
OUTPATIENT PHYSICAL THERAPY SHOULDER TREATMENT   Patient Name: Bobby Chen MRN: [id number] DOB:1945/06/14, 76 y.o., male Today's Date: 11/23/2021   PT End of Session - 11/23/21 1100     Visit Number 17    Number of Visits 33    Date for PT Re-Evaluation 01/04/22    Authorization Type eval: 07/31/12, recertification 4/81/85    PT Start Time 1101    PT Stop Time 1145    PT Time Calculation (min) 44 min    Activity Tolerance Patient tolerated treatment well    Behavior During Therapy [facility] for tasks assessed/performed             Past Medical History:  Diagnosis Date   Anginal pain ([place])    Brown recluse spider bite    Cancer ([place])    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, LEFT ARM    COPD (chronic obstructive pulmonary disease) ([place])    Diabetes mellitus without complication ([place])    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[place])    Elevated lipids    Hypertension    Obesity    Pulmonary arterial hypertension ([place])    Sleep apnea    Past Surgical History:  Procedure Laterality Date   ANKLE FRACTURE SURGERY     BASAL CELL CARCINOMA EXCISION N/A    scalp   COLONOSCOPY W/ POLYPECTOMY     COLONOSCOPY WITH PROPOFOL N/A 01/18/2016   Procedure: COLONOSCOPY WITH PROPOFOL;  Surgeon: Lollie Sails, MD;  Location: [facility] ENDOSCOPY;  Service: Endoscopy;  Laterality: N/A;   COLONOSCOPY WITH PROPOFOL N/A 07/02/2018   Procedure: COLONOSCOPY WITH PROPOFOL;  Surgeon: Lollie Sails, MD;  Location: [facility] ENDOSCOPY;  Service: Endoscopy;  Laterality: N/A;   ESOPHAGOGASTRODUODENOSCOPY (EGD) WITH PROPOFOL N/A 01/18/2016   Procedure: ESOPHAGOGASTRODUODENOSCOPY (EGD) WITH PROPOFOL;  Surgeon: Lollie Sails, MD;  Location: [facility] ENDOSCOPY;  Service: Endoscopy;  Laterality: N/A;   FRACTURE SURGERY Right 1984   ankle   HERNIA REPAIR     UMBILICAL   LEG SURGERY Left    NASAL SEPTOPLASTY W/ TURBINOPLASTY Bilateral 11/06/2019   Procedure: NASAL SEPTOPLASTY WITH INFERIOR TURBINATE REDUCTION;  Surgeon: Margaretha Sheffield, MD;  Location:  [facility] ORS;  Service: ENT;  Laterality: Bilateral;   toenail removal Bilateral    Patient Active Problem List   Diagnosis Date Noted   Pain in joint, ankle and foot 10/19/2021   Lymphedema 10/19/2021   Spinal stenosis of cervical region 08/26/2021   Bilateral hearing loss 04/10/2020   Difficulty walking 04/10/2020   Sensory ataxia 04/10/2020   B12 deficiency 02/03/2020   Leg pain, right 08/26/2019   Chronic venous insufficiency 08/26/2019   Venous insufficiency 08/26/2019   PAD (peripheral artery disease) ([place]) 08/14/2019   PVD (peripheral vascular disease) ([place]) 08/14/2019   Obstructive sleep apnea 05/06/2019   Obesity, Class II, BMI 35-39.9 03/11/2019   Type 2 diabetes mellitus with stage 3a chronic kidney disease, without long-term current use of insulin ([place]) 03/11/2019   Pain in joint involving ankle and foot 11/12/2018   Nuclear sclerotic cataract of both eyes 08/30/2018   Abnormal sensation in left ear 08/20/2018   Dizziness 08/20/2018   Numbness and tingling of left side of face 08/20/2018   Essential hypertension with goal blood pressure less than 130/80 03/19/2018   Healthcare maintenance 08/07/2017   Brown recluse spider bite 05/08/2017   SCC (squamous cell carcinoma), 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/2015   Severe obesity (BMI 35.0-35.9 with comorbidity) ([place]) 06/01/2015   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[place]) 09/29/2013   Hyperlipidemia associated with type 2 diabetes mellitus (  [place]) 09/29/2013    PCP: Kirk Ruths, MD  REFERRING PROVIDER: Diamond Nickel, DO  REFERRING DIAGNOSIS: M25.511 (ICD-10-CM) - Pain in right shoulder W19.XXXA (ICD-10-CM) - Unspecified fall, initial encounter [phone] (ICD-10-CM) - Other muscle spasm M75.41 (ICD-10-CM) - Impingement syndrome of right shoulder   THERAPY DIAG: Right shoulder pain, unspecified chronicity  RATIONALE FOR EVALUATION AND TREATMENT: Rehabilitation  ONSET DATE: 06/23/21 (approximate)  FOLLOW UP APPT WITH PROVIDER: Yes, returns in June  2023 to see [name]   From initial evaluation  SUBJECTIVE:                                                                                                                                                                                         Chief Complaint: R shoulder pain  Pertinent History Pt referred to PT for R shoulder pain by orthopedics. His pain began after a fall at the end of March 2023. He was walking down steps and started leaning forward. He responded by leaning back and he fell backwards bracing himself with his arms. He experienced immediate R shoulder pain. He denies any additional falls in the last 6 months but he did have a fall about 1 year ago. He had an evaluation of his R shoulder pain at his PCP office. Plain films of his thoracic spine and ribs were obtained which did not show any rib fractures but multilevel disc changes in his thoracic spine. He reports that prior to his fall he was receiving physical therapy for his cervical stenosis with notable improvement in his pain and mobility. He was last evaluated by physiatry on 08/26/2021 for his neck pain that radiates into his right arm. This was suspected to be due to his nerve root impingement seen on recent cervical spine MRI. There is also question of carpal tunnel syndrome. He was recommended EMG of the bilateral upper extremities and epidural steroid injection to his cervical spine. Pt states that [facility] PT told him there were "unable to see him" for his R shoulder pain. Since his fall he has had more pain along his posterior right shoulder. The pain is "under my shoulder blade" and it radiates around his right trunk. He points to his lateral R ribs, scapula, and periscapular area mostly.  He denies associated swelling, shoulder locking/catching, shoulder instability, weakness, fevers or chills, night sweats, weight loss, skin color change. He has tried acetaminophen, [place] dream cream, activity modification,  chronic medication nortriptyline/pregabalin/tramadol with continued symptoms. He is right hand dominant and is retired from being a Art gallery manager. He also was previously in the [employer]   Pain:  Pain Intensity: Present: 0/10, Best: 0/10,  Worst: 7/10 Pain location: R thoracic paraspinal/periscapular region Pain Quality: dull  Radiating: Yes, around the R ribs  Numbness/Tingling: No, He reports questionable minor numbness around R ribs Focal Weakness: No, not related to current R shoulder pain Aggravating factors: leaning to the left, standing forward from a chair, bending forward "makes me feel drunk," reaching out with RUE; Relieving factors: Tylenol, topical analgesic provides temporary relief, no help with ice/heat,  24-hour pain behavior: "All day long" History of prior shoulder or neck/shoulder injury, pain, surgery, or therapy: Yes, see history Falls: Has patient fallen in last 6 months? Yes, Number of falls: 1 Dominant hand: right Imaging: Yes, Right Shoulder Radiographs - 3 views (Gr[place]hey, Axillary, Scapular Y) performed 08/31/2021: Type I acromion. Mild AC joint degenerative change with osteophyte formation. The distal clavicle, scapula, and proximal humerus are intact. Glenohumeral and acromioclavicular joint alignment appears normal. No fractures or dislocations. Well maintained glenohumeral joint space without evidence of degenerative joint disease. No soft tissue swelling or joint effusion. No destructive bony lesion is identified.  Cervical spine MRI without contrast performed 04/15/2021 IMPRESSION: Cervical spondylosis, as outlined and with findings most notably as follows: At C5-C6, a moderate-sized central disc protrusion contributes to multifactorial severe spinal canal stenosis with moderate spinal cord flattening. No definite spinal cord signal abnormality is identified, although motion degradation limits evaluation. Multifactorial bilateral neural foraminal narrowing (moderate right,  severe left). No more than mild relative spinal canal narrowing at the remaining levels. Of note, a right center disc protrusion contacts the ventral spinal cord at C6-C7. Additional sites of foraminal stenosis, as detailed and greatest on the left at C3-C4 (moderate) and on the right at C6-C7 (moderate).    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  Recent memory is intact.  Remote memory is intact.  Attention span and concentration are intact.  Expressive speech is intact.  Patient's fund of knowledge is within normal limits for educational level. Conversation is tangential with frequent diversions     Gross Musculoskeletal Assessment Tremor: None Bulk: Normal Tone: Normal Overweight  Gait Full gait assessment deferred  Posture Forward head/rounded shoulders in sitting and standing   Cervical Screen Spurlings A (ipsilateral lateral flexion/axial compression): R: Positive for reproduction of periscapular pain L: Positive Spurlings B (ipsilateral lateral flexion/contralateral rotation/axial compression): R: Positive for reproduction of periscapular pain L: Positive Repeated movement: Not performed Hoffman Sign (cervical cord compression): R: Negative L: Negative ULTT Median: R: Not examined L: Not examined ULTT Ulnar: R: Not examined L: Not examined ULTT Radial: R: Not examined L: Not examined   AROM  AROM (Normal range in degrees)  AROM 11/23/2021  Cervical  Flexion (50) 43  Extension (80) 23*  Right lateral flexion (45) 35*  Left lateral flexion (45) 35  Right rotation (85)   Left rotation (85)    Right Left  Shoulder    Flexion    Extension    Abduction    External Rotation    Internal Rotation    Hands  Behind Head    Hands Behind Back        Elbow    Flexion    Extension    Pronation    Supination    (* = pain; Blank rows = not tested)    LE MMT:  MMT (out of 5) Right 11/23/2021 Left 11/23/2021  Cervical (isometric)  Flexion Weak  Extension Weak  Lateral Flexion Weak Weak  Rotation Weak Weak      Shoulder   Flexion 4 4  Extension    Abduction 4+ 4  External rotation 3+* 3+  Internal rotation 5 5  Horizontal abduction    Horizontal adduction    Lower Trapezius    Rhomboids        Elbow  Flexion 5 5  Extension 5 5  Pronation    Supination        Wrist  Flexion 5 5  Extension 5 5  Radial deviation    Ulnar deviation        MCP  Flexion 5 5  Extension 5 5  Abduction    Adduction    (* = pain; Blank rows = not tested)  Positive reproduction of concordant pain with resisted L shoulder ER  Sensation Deferred  Reflexes Deferred   Palpation  Location LEFT  RIGHT           Subocciptials  0  Cervical paraspinals  2  Upper Trapezius    Levator Scapulae    Rhomboid Major/Minor  2  Sternoclavicular joint    Acromioclavicular joint    Coracoid process    Long head of biceps  2  Supraspinatus    Infraspinatus    Subscapularis    Teres Minor    Teres Major    Pectoralis Major    Pectoralis Minor    Anterior Deltoid  1  Lateral Deltoid    Posterior Deltoid    Latissimus Dorsi    Sternocleidomastoid    (Blank rows = not tested) Graded on 0-4 scale (0 = no pain, 1 = pain, 2 = pain with wincing/grimacing/flinching, 3 = pain with withdrawal, 4 = unwilling to allow palpation), (Blank rows = not tested)   Repeated Movements Deferred  Passive Accessory  Intervertebral Motion Deferred   Accessory Motions/Glides Deferred   SPECIAL TESTS Rotator Cuff  Drop Arm Test: Negative Painful Arc (Pain from 60 to 120 degrees scaption): Negative Infraspinatus Muscle Test: Positive   Subacromial Impingement Deferred  Labral Tear Deferred  Bicep Tendon Pathology Deferred  Shoulder Instability Deferred   Beighton scale: Deferred  FROM 09/23/21 VISIT:  AROM  AROM (Normal range in degrees) AROM 09/23/2021  Cervical  Flexion (50) 43  Extension (80) 23*  Right lateral flexion (45) 35*  Left lateral flexion (45) 35  Right rotation (85)   Left rotation (85)    Right Left  Shoulder    Flexion 160* 160*  Extension    Abduction 145* 120*  External Rotation 60* 55*  Internal Rotation 86 85  Hands Behind Head    Hands Behind Back        Elbow    Flexion WNL   Extension WNL   Pronation    Supination    (* = pain; Blank rows = not tested)  Passive Accessory Intervertebral Motion Pt denies reproduction of shoulder pain with CPA C2-T7 and R UPA C2-T7 however he is tender centrally to pressure. Generally, hypomobile throughout however difficult to assess mobility secondary to pain;  Accessory Motions/Glides Glenohumeral:  Posterior: R: Painful, unable to fully assess L: not examined Inferior: R: abnormal, hypomobile and painful L: not examined Anterior: R: normal L: not examined  Acromioclavicular:  Posterior: R: Too painful to assess fully  L: not examined Anterior: R: Too painful to assess fully L: not examined  Sternoclavicular: Posterior: R: Too painful to assess fully L: not examined Anterior: R: Too painful to assess fully L: not examined Superior: R: Too painful to assess fully L: not examined Inferior: R: Too painful to assess fully L: not examined  Scapulothoracic: Painful in all directions so unable to fully assess mobility;   Muscle Length Testing Deferred   SPECIAL TESTS (all on right side)  Subacromial  Impingement Hawkins-Kennedy: Positive Neer (Block scapula, PROM flexion): Positive Painful Arc (Pain from 60 to 120 degrees scaption): Negative Empty Can: Positive External Rotation Resistance: Positive Horizontal Adduction: Negative Scapular Assist: Not examined  Labral Tear Biceps Load II (120 elevation, full ER, 90 elbow flexion, full supination, resisted elbow flexion): Negative Crank (160 scaption, axial load with IR/ER): Positive Active Compression Test: Negative  Bicep Tendon Pathology Speed (shoulder flexion to 90, external rotation, full elbow extension, and forearm supination with resistance: Negative Yergason's (resisted shoulder ER and supination/biceps tendon pathology): Not examined  Shoulder Instability Sulcus Sign: Positive Anterior Apprehension: Negative    TODAY'S TREATMENT   SUBJECTIVE: Pt reports that he is doing alright today. He has continued taking the Flexeril to help with his pain. Denies pain upon arrival today. MRI scheduled for Friday. No specific questions or concerns currently.   PAIN: Denies at rest    Ther-ex  UBE x 5 minutes (2.5 forward/2.5 backwards) for warm-up, unbilled; Seated "W's" with green tband 3 x 10; Seated Nautilus lat pull down 110# 3 x 10; Seated Nautilus rows with 60# 3 x 15; Seated scapular retractions 3 x 10 with tactile cues for proper technique; Standing Nautilus chest press 50# x 10, 60# 2 x 10; Seated shoulder flexion with 3# DB 3 x 15 BUE; Seated shoulder scaption with 3# DB 3 x 15 BUE; Seated overhead shoulder press with 3# DB x 10, discontinued secondary to increase in flank pain;   Not performed today: Standing Nautilus shoulder extension 40# 3 x 10; Supine R shoulder flexion with 5# dumbbell (DB) 3 x 10; Supine R shoulder circles (CW and CCW) with 5# DB 3 x 10 each direction; Supine R shoulder serratus punch with 5# DB 3 x 10; L sidelying R shoulder abduction with 5# DB 3 x 10; L sidelying R shoulder ER  with 5# DB 3 x 10;   PATIENT EDUCATION:  Education details: Pt educated throughout session about proper posture and technique with exercises. Improved exercise technique, movement at target joints, use of target muscles after min to mod verbal, visual, tactile cues. Person educated: Patient Education method: Explanation, verbal cues, tactile cues, and handout Education comprehension: verbalized understanding,    HOME EXERCISE PROGRAM: Access Code: [email or web address] URL: [email or web address] Date: 09/23/2021 Prepared by: Roxana Hires  Exercises - Seated Scapular Retraction  - 1 x daily - 7 x weekly - 2 sets - 10 reps - 2s hold - Standing Shoulder Row with Anchored Resistance  - 1 x daily - 7 x weekly - 2 sets - 10 reps - 2s hold - Shoulder W - External Rotation with Resistance  - 1 x daily - 7 x weekly - 2 sets - 10 reps - 2s hold   ASSESSMENT:  CLINICAL IMPRESSION:   Continued with pain-free strengthening  today. Increased weight, reps, and sets as pt is able. Continued with Nautilus strengthening for high resistance exercises. Pt encouraged to continue HEP and follow-up as scheduled. He will continue to benefit from PT services to address deficits in strength, pain, and range of motion in order to return to full function at home.   REHAB POTENTIAL: Fair secondary to multiple comorbidities and ongoing chronic neck pain  CLINICAL DECISION MAKING: Unstable/unpredictable  EVALUATION COMPLEXITY: High   GOALS: Goals reviewed with patient? Yes  SHORT TERM GOALS: Target date: 12/07/2021   Pt will be independent with HEP to improve strength and decrease shoulder pain to improve pain-free function at home and work. Baseline:  Goal status: ONGOING   LONG TERM GOALS: Target date: 01/04/2022   Pt will increase FOTO to at least 62 to demonstrate significant improvement in function at home and work related to neck pain  Baseline: 09/15/21: 50; 10/21/21: 53 Goal status: PARTIALLY  MET  2.  Pt will decrease worst shoulder pain by at least 3 points on the NPRS in order to demonstrate clinically significant reduction in shoulder pain. Baseline: 09/15/21: worst: 7/10; 10/21/21: worst: 7/10; Goal status: ONGOING  3.  Pt will decrease quick DASH score by at least 8% in order to demonstrate clinically significant reduction in disability related to shoulder pain        Baseline: 09/15/21: 37.5%, 10/21/21: 45.5%  Goal status: ONGOING  4. Pt will increase strength of pain-free R shoulder external rotation to at least 4/5 in order to demonstrate improvement in strength and function of RUE       Baseline: 09/15/21: 3+/5 painful, 10/21/21: 4+/5 with minimal pain Goal status: PARTIALLY MET   PLAN: PT FREQUENCY: 1-2x/week  PT DURATION: 8 weeks  PLANNED INTERVENTIONS: Therapeutic exercises, Therapeutic activity, Neuromuscular re-education, Balance training, Gait training, Patient/Family education, Joint manipulation, Joint mobilization, Vestibular training, Canalith repositioning, Aquatic Therapy, Dry Needling, Electrical stimulation, Spinal manipulation, Spinal mobilization, Cryotherapy, Moist heat, Traction, Ultrasound, Ionotophoresis 55m/ml Dexamethasone, and Manual therapy  PLAN FOR NEXT SESSION:  continue strengthening , review and modify HEP as needed  JLyndel SafeHuprich PT, DPT, GCS  Keirstin Musil 11/23/2021, 1:05 PM

## 2021-11-23 ENCOUNTER — Ambulatory Visit: Payer: Medicare Other | Attending: Sports Medicine

## 2021-11-23 DIAGNOSIS — M25511 Pain in right shoulder: Secondary | ICD-10-CM | POA: Diagnosis present

## 2021-11-25 ENCOUNTER — Ambulatory Visit: Payer: Medicare Other

## 2021-11-25 DIAGNOSIS — M25511 Pain in right shoulder: Secondary | ICD-10-CM | POA: Diagnosis not present

## 2021-11-25 NOTE — Therapy (Signed)
OUTPATIENT PHYSICAL THERAPY SHOULDER TREATMENT   Patient Name: TABOR DENHAM MRN: [id number] DOB:25-Jun-1945, 76 y.o., male Today's Date: 11/25/2021   PT End of Session - 11/25/21 2155     Visit Number 18    Number of Visits 33    Date for PT Re-Evaluation 01/04/22    Authorization Type eval: 7/90/24, recertification 0/97/35    PT Start Time 1100    PT Stop Time 1145    PT Time Calculation (min) 45 min    Activity Tolerance Patient tolerated treatment well    Behavior During Therapy [facility] for tasks assessed/performed              Past Medical History:  Diagnosis Date   Anginal pain ([place])    Brown recluse spider bite    Cancer ([place])    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, LEFT ARM    COPD (chronic obstructive pulmonary disease) ([place])    Diabetes mellitus without complication ([place])    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[place])    Elevated lipids    Hypertension    Obesity    Pulmonary arterial hypertension ([place])    Sleep apnea    Past Surgical History:  Procedure Laterality Date   ANKLE FRACTURE SURGERY     BASAL CELL CARCINOMA EXCISION N/A    scalp   COLONOSCOPY W/ POLYPECTOMY     COLONOSCOPY WITH PROPOFOL N/A 01/18/2016   Procedure: COLONOSCOPY WITH PROPOFOL;  Surgeon: Lollie Sails, MD;  Location: [facility] ENDOSCOPY;  Service: Endoscopy;  Laterality: N/A;   COLONOSCOPY WITH PROPOFOL N/A 07/02/2018   Procedure: COLONOSCOPY WITH PROPOFOL;  Surgeon: Lollie Sails, MD;  Location: [facility] ENDOSCOPY;  Service: Endoscopy;  Laterality: N/A;   ESOPHAGOGASTRODUODENOSCOPY (EGD) WITH PROPOFOL N/A 01/18/2016   Procedure: ESOPHAGOGASTRODUODENOSCOPY (EGD) WITH PROPOFOL;  Surgeon: Lollie Sails, MD;  Location: [facility] ENDOSCOPY;  Service: Endoscopy;  Laterality: N/A;   FRACTURE SURGERY Right 1984   ankle   HERNIA REPAIR     UMBILICAL   LEG SURGERY Left    NASAL SEPTOPLASTY W/ TURBINOPLASTY Bilateral 11/06/2019   Procedure: NASAL SEPTOPLASTY WITH INFERIOR TURBINATE REDUCTION;  Surgeon: Margaretha Sheffield, MD;  Location:  [facility] ORS;  Service: ENT;  Laterality: Bilateral;   toenail removal Bilateral    Patient Active Problem List   Diagnosis Date Noted   Pain in joint, ankle and foot 10/19/2021   Lymphedema 10/19/2021   Spinal stenosis of cervical region 08/26/2021   Bilateral hearing loss 04/10/2020   Difficulty walking 04/10/2020   Sensory ataxia 04/10/2020   B12 deficiency 02/03/2020   Leg pain, right 08/26/2019   Chronic venous insufficiency 08/26/2019   Venous insufficiency 08/26/2019   PAD (peripheral artery disease) ([place]) 08/14/2019   PVD (peripheral vascular disease) ([place]) 08/14/2019   Obstructive sleep apnea 05/06/2019   Obesity, Class II, BMI 35-39.9 03/11/2019   Type 2 diabetes mellitus with stage 3a chronic kidney disease, without long-term current use of insulin ([place]) 03/11/2019   Pain in joint involving ankle and foot 11/12/2018   Nuclear sclerotic cataract of both eyes 08/30/2018   Abnormal sensation in left ear 08/20/2018   Dizziness 08/20/2018   Numbness and tingling of left side of face 08/20/2018   Essential hypertension with goal blood pressure less than 130/80 03/19/2018   Healthcare maintenance 08/07/2017   Brown recluse spider bite 05/08/2017   SCC (squamous cell carcinoma), 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/2015   Severe obesity (BMI 35.0-35.9 with comorbidity) ([place]) 06/01/2015   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[place]) 09/29/2013   Hyperlipidemia associated with type 2 diabetes  mellitus ([place]) 09/29/2013    PCP: Kirk Ruths, MD  REFERRING PROVIDER: Diamond Nickel, DO  REFERRING DIAGNOSIS: M25.511 (ICD-10-CM) - Pain in right shoulder W19.XXXA (ICD-10-CM) - Unspecified fall, initial encounter [phone] (ICD-10-CM) - Other muscle spasm M75.41 (ICD-10-CM) - Impingement syndrome of right shoulder   THERAPY DIAG: Right shoulder pain, unspecified chronicity  RATIONALE FOR EVALUATION AND TREATMENT: Rehabilitation  ONSET DATE: 06/23/21 (approximate)  FOLLOW UP APPT WITH PROVIDER: Yes, returns in June  2023 to see [name]   From initial evaluation  SUBJECTIVE:                                                                                                                                                                                         Chief Complaint: R shoulder pain  Pertinent History Pt referred to PT for R shoulder pain by orthopedics. His pain began after a fall at the end of March 2023. He was walking down steps and started leaning forward. He responded by leaning back and he fell backwards bracing himself with his arms. He experienced immediate R shoulder pain. He denies any additional falls in the last 6 months but he did have a fall about 1 year ago. He had an evaluation of his R shoulder pain at his PCP office. Plain films of his thoracic spine and ribs were obtained which did not show any rib fractures but multilevel disc changes in his thoracic spine. He reports that prior to his fall he was receiving physical therapy for his cervical stenosis with notable improvement in his pain and mobility. He was last evaluated by physiatry on 08/26/2021 for his neck pain that radiates into his right arm. This was suspected to be due to his nerve root impingement seen on recent cervical spine MRI. There is also question of carpal tunnel syndrome. He was recommended EMG of the bilateral upper extremities and epidural steroid injection to his cervical spine. Pt states that [facility] PT told him there were "unable to see him" for his R shoulder pain. Since his fall he has had more pain along his posterior right shoulder. The pain is "under my shoulder blade" and it radiates around his right trunk. He points to his lateral R ribs, scapula, and periscapular area mostly.  He denies associated swelling, shoulder locking/catching, shoulder instability, weakness, fevers or chills, night sweats, weight loss, skin color change. He has tried acetaminophen, [place] dream cream, activity modification,  chronic medication nortriptyline/pregabalin/tramadol with continued symptoms. He is right hand dominant and is retired from being a Art gallery manager. He also was previously in the [employer]   Pain:  Pain Intensity: Present: 0/10, Best:  0/10, Worst: 7/10 Pain location: R thoracic paraspinal/periscapular region Pain Quality: dull  Radiating: Yes, around the R ribs  Numbness/Tingling: No, He reports questionable minor numbness around R ribs Focal Weakness: No, not related to current R shoulder pain Aggravating factors: leaning to the left, standing forward from a chair, bending forward "makes me feel drunk," reaching out with RUE; Relieving factors: Tylenol, topical analgesic provides temporary relief, no help with ice/heat,  24-hour pain behavior: "All day long" History of prior shoulder or neck/shoulder injury, pain, surgery, or therapy: Yes, see history Falls: Has patient fallen in last 6 months? Yes, Number of falls: 1 Dominant hand: right Imaging: Yes, Right Shoulder Radiographs - 3 views (Gr[place]hey, Axillary, Scapular Y) performed 08/31/2021: Type I acromion. Mild AC joint degenerative change with osteophyte formation. The distal clavicle, scapula, and proximal humerus are intact. Glenohumeral and acromioclavicular joint alignment appears normal. No fractures or dislocations. Well maintained glenohumeral joint space without evidence of degenerative joint disease. No soft tissue swelling or joint effusion. No destructive bony lesion is identified.  Cervical spine MRI without contrast performed 04/15/2021 IMPRESSION: Cervical spondylosis, as outlined and with findings most notably as follows: At C5-C6, a moderate-sized central disc protrusion contributes to multifactorial severe spinal canal stenosis with moderate spinal cord flattening. No definite spinal cord signal abnormality is identified, although motion degradation limits evaluation. Multifactorial bilateral neural foraminal narrowing (moderate right,  severe left). No more than mild relative spinal canal narrowing at the remaining levels. Of note, a right center disc protrusion contacts the ventral spinal cord at C6-C7. Additional sites of foraminal stenosis, as detailed and greatest on the left at C3-C4 (moderate) and on the right at C6-C7 (moderate).    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  Recent memory is intact.  Remote memory is intact.  Attention span and concentration are intact.  Expressive speech is intact.  Patient's fund of knowledge is within normal limits for educational level. Conversation is tangential with frequent diversions     Gross Musculoskeletal Assessment Tremor: None Bulk: Normal Tone: Normal Overweight  Gait Full gait assessment deferred  Posture Forward head/rounded shoulders in sitting and standing   Cervical Screen Spurlings A (ipsilateral lateral flexion/axial compression): R: Positive for reproduction of periscapular pain L: Positive Spurlings B (ipsilateral lateral flexion/contralateral rotation/axial compression): R: Positive for reproduction of periscapular pain L: Positive Repeated movement: Not performed Hoffman Sign (cervical cord compression): R: Negative L: Negative ULTT Median: R: Not examined L: Not examined ULTT Ulnar: R: Not examined L: Not examined ULTT Radial: R: Not examined L: Not examined   AROM  AROM (Normal range in degrees)  AROM 11/25/2021  Cervical  Flexion (50) 43  Extension (80) 23*  Right lateral flexion (45) 35*  Left lateral flexion (45) 35  Right rotation (85)   Left rotation (85)    Right Left  Shoulder    Flexion    Extension    Abduction    External Rotation    Internal Rotation  Hands Behind Head    Hands Behind Back        Elbow    Flexion    Extension    Pronation    Supination    (* = pain; Blank rows = not tested)    LE MMT:  MMT (out of 5) Right 11/25/2021 Left 11/25/2021  Cervical (isometric)  Flexion Weak  Extension Weak  Lateral Flexion Weak Weak  Rotation Weak Weak      Shoulder   Flexion 4 4  Extension    Abduction 4+ 4  External rotation 3+* 3+  Internal rotation 5 5  Horizontal abduction    Horizontal adduction    Lower Trapezius    Rhomboids        Elbow  Flexion 5 5  Extension 5 5  Pronation    Supination        Wrist  Flexion 5 5  Extension 5 5  Radial deviation    Ulnar deviation        MCP  Flexion 5 5  Extension 5 5  Abduction    Adduction    (* = pain; Blank rows = not tested)  Positive reproduction of concordant pain with resisted L shoulder ER  Sensation Deferred  Reflexes Deferred   Palpation  Location LEFT  RIGHT           Subocciptials  0  Cervical paraspinals  2  Upper Trapezius    Levator Scapulae    Rhomboid Major/Minor  2  Sternoclavicular joint    Acromioclavicular joint    Coracoid process    Long head of biceps  2  Supraspinatus    Infraspinatus    Subscapularis    Teres Minor    Teres Major    Pectoralis Major    Pectoralis Minor    Anterior Deltoid  1  Lateral Deltoid    Posterior Deltoid    Latissimus Dorsi    Sternocleidomastoid    (Blank rows = not tested) Graded on 0-4 scale (0 = no pain, 1 = pain, 2 = pain with wincing/grimacing/flinching, 3 = pain with withdrawal, 4 = unwilling to allow palpation), (Blank rows = not tested)   Repeated Movements Deferred  Passive Accessory  Intervertebral Motion Deferred   Accessory Motions/Glides Deferred   SPECIAL TESTS Rotator Cuff  Drop Arm Test: Negative Painful Arc (Pain from 60 to 120 degrees scaption): Negative Infraspinatus Muscle Test: Positive   Subacromial Impingement Deferred  Labral Tear Deferred  Bicep Tendon Pathology Deferred  Shoulder Instability Deferred   Beighton scale: Deferred  FROM 09/23/21 VISIT:  AROM  AROM (Normal range in degrees) AROM 09/23/2021  Cervical  Flexion (50) 43  Extension (80) 23*  Right lateral flexion (45) 35*  Left lateral flexion (45) 35  Right rotation (85)   Left rotation (85)    Right Left  Shoulder    Flexion 160* 160*  Extension    Abduction 145* 120*  External Rotation 60* 55*  Internal Rotation 86 85  Hands Behind Head    Hands Behind Back        Elbow    Flexion WNL   Extension WNL   Pronation    Supination    (* = pain; Blank rows = not tested)  Passive Accessory Intervertebral Motion Pt denies reproduction of shoulder pain with CPA C2-T7 and R UPA C2-T7 however he is tender centrally to pressure. Generally, hypomobile throughout however difficult to assess mobility secondary to pain;  Accessory Motions/Glides  Glenohumeral: Posterior: R: Painful, unable to fully assess L: not examined Inferior: R: abnormal, hypomobile and painful L: not examined Anterior: R: normal L: not examined  Acromioclavicular:  Posterior: R: Too painful to assess fully  L: not examined Anterior: R: Too painful to assess fully L: not examined  Sternoclavicular: Posterior: R: Too painful to assess fully L: not examined Anterior: R: Too painful to assess fully L: not examined Superior: R: Too painful to assess fully L: not examined Inferior: R: Too painful to assess fully L: not examined  Scapulothoracic: Painful in all directions so unable to fully assess mobility;   Muscle Length Testing Deferred   SPECIAL TESTS (all on right side)  Subacromial  Impingement Hawkins-Kennedy: Positive Neer (Block scapula, PROM flexion): Positive Painful Arc (Pain from 60 to 120 degrees scaption): Negative Empty Can: Positive External Rotation Resistance: Positive Horizontal Adduction: Negative Scapular Assist: Not examined  Labral Tear Biceps Load II (120 elevation, full ER, 90 elbow flexion, full supination, resisted elbow flexion): Negative Crank (160 scaption, axial load with IR/ER): Positive Active Compression Test: Negative  Bicep Tendon Pathology Speed (shoulder flexion to 90, external rotation, full elbow extension, and forearm supination with resistance: Negative Yergason's (resisted shoulder ER and supination/biceps tendon pathology): Not examined  Shoulder Instability Sulcus Sign: Positive Anterior Apprehension: Negative    TODAY'S TREATMENT   SUBJECTIVE: Pt reports that he is doing alright today. He has continued taking the Flexeril to help with his pain. Denies pain upon arrival today. MRI scheduled for Friday. No specific questions or concerns currently.   PAIN: Denies pain at rest    Ther-ex  UBE x 5 minutes (2.5 forward/2.5 backwards) for warm-up, unbilled; Dix-Hallpike, Roll Test, and Bow Test negative bilaterally; Supine R shoulder flexion with 5# dumbbell (DB) 3 x 15; Supine R shoulder circles (CW and CCW) with 5# DB 3 x 10 each direction; Supine R shoulder serratus punch with 5# DB 2 x 15; Supine R elbow flexion with 5# DB 2 x 15; L sidelying R shoulder abduction with 5# DB 2 x 15; L sidelying R shoulder ER with 5# DB 2 x 15; Seated scapular retractions 2 x 10 with tactile cues for proper technique;   Not performed today: Standing Nautilus shoulder extension 40# 3 x 10; Seated "W's" with green tband 3 x 10; Seated Nautilus lat pull down 110# 3 x 10; Seated Nautilus rows with 60# 3 x 15; Standing Nautilus chest press 50# x 10, 60# 2 x 10; Seated shoulder flexion with 3# DB 3 x 15 BUE; Seated shoulder  scaption with 3# DB 3 x 15 BUE; Seated overhead shoulder press with 3# DB x 10, discontinued secondary to increase in flank pain;   PATIENT EDUCATION:  Education details: Pt educated throughout session about proper posture and technique with exercises. Improved exercise technique, movement at target joints, use of target muscles after min to mod verbal, visual, tactile cues. Person educated: Patient Education method: Explanation, verbal cues, tactile cues, and handout Education comprehension: verbalized understanding,    HOME EXERCISE PROGRAM: Access Code: [email or web address] URL: [email or web address] Date: 09/23/2021 Prepared by: Roxana Hires  Exercises - Seated Scapular Retraction  - 1 x daily - 7 x weekly - 2 sets - 10 reps - 2s hold - Standing Shoulder Row with Anchored Resistance  - 1 x daily - 7 x weekly - 2 sets - 10 reps - 2s hold - Shoulder W - External Rotation with Resistance  - 1 x daily - 7 x weekly -  2 sets - 10 reps - 2s hold   ASSESSMENT:  CLINICAL IMPRESSION:   Continued with pain-free strengthening today. Increased reps to progress challenge. Pt reports feeling like his vertigo was going to start this morning when washing his hair in the sink. Dix-Hallpike, Roll Test, and [employer] are all negative today. Pt encouraged to continue HEP and follow-up as scheduled. He has a cervical MRI scheduled for tomorrow. He will continue to benefit from PT services to address deficits in strength, pain, and range of motion in order to return to full function at home.   REHAB POTENTIAL: Fair secondary to multiple comorbidities and ongoing chronic neck pain  CLINICAL DECISION MAKING: Unstable/unpredictable  EVALUATION COMPLEXITY: High   GOALS: Goals reviewed with patient? Yes  SHORT TERM GOALS: Target date: 12/07/2021   Pt will be independent with HEP to improve strength and decrease shoulder pain to improve pain-free function at home and work. Baseline:  Goal status:  ONGOING   LONG TERM GOALS: Target date: 01/04/2022   Pt will increase FOTO to at least 62 to demonstrate significant improvement in function at home and work related to neck pain  Baseline: 09/15/21: 50; 10/21/21: 53 Goal status: PARTIALLY MET  2.  Pt will decrease worst shoulder pain by at least 3 points on the NPRS in order to demonstrate clinically significant reduction in shoulder pain. Baseline: 09/15/21: worst: 7/10; 10/21/21: worst: 7/10; Goal status: ONGOING  3.  Pt will decrease quick DASH score by at least 8% in order to demonstrate clinically significant reduction in disability related to shoulder pain        Baseline: 09/15/21: 37.5%, 10/21/21: 45.5%  Goal status: ONGOING  4. Pt will increase strength of pain-free R shoulder external rotation to at least 4/5 in order to demonstrate improvement in strength and function of RUE       Baseline: 09/15/21: 3+/5 painful, 10/21/21: 4+/5 with minimal pain Goal status: PARTIALLY MET   PLAN: PT FREQUENCY: 1-2x/week  PT DURATION: 8 weeks  PLANNED INTERVENTIONS: Therapeutic exercises, Therapeutic activity, Neuromuscular re-education, Balance training, Gait training, Patient/Family education, Joint manipulation, Joint mobilization, Vestibular training, Canalith repositioning, Aquatic Therapy, Dry Needling, Electrical stimulation, Spinal manipulation, Spinal mobilization, Cryotherapy, Moist heat, Traction, Ultrasound, Ionotophoresis 19m/ml Dexamethasone, and Manual therapy  PLAN FOR NEXT SESSION:  continue strengthening , review and modify HEP as needed  JLyndel SafeHuprich PT, DPT, GCS  Deloma Spindle 11/25/2021, 10:00 PM

## 2021-11-26 ENCOUNTER — Ambulatory Visit
Admission: RE | Admit: 2021-11-26 | Discharge: 2021-11-26 | Disposition: A | Discharge status: Home / Self Care | Payer: Medicare Other | Source: Ambulatory Visit | Attending: Neurosurgery | Admitting: Neurosurgery

## 2021-11-26 DIAGNOSIS — M4802 Spinal stenosis, cervical region: Secondary | ICD-10-CM | POA: Insufficient documentation

## 2021-11-26 DIAGNOSIS — M4712 Other spondylosis with myelopathy, cervical region: Secondary | ICD-10-CM | POA: Diagnosis present

## 2021-11-26 MED ORDER — GADOBUTROL 1 MMOL/ML IV SOLN
10.0000 mL | Freq: Once | INTRAVENOUS | Status: AC | PRN
  Administered 2021-11-26: 10 mL via INTRAVENOUS

## 2021-12-01 NOTE — Therapy (Signed)
OUTPATIENT PHYSICAL THERAPY SHOULDER TREATMENT   Patient Name: CHAUNCY MANGIARACINA MRN: [id number] DOB:1945/06/05, 76 y.o., male Today's Date: 12/03/2021   PT End of Session - 12/03/21 0859     Visit Number 19    Number of Visits 33    Date for PT Re-Evaluation 01/04/22    Authorization Type eval: 0/63/01, recertification 09/24/07    PT Start Time 0858    PT Stop Time 0930    PT Time Calculation (min) 32 min    Activity Tolerance Patient tolerated treatment well    Behavior During Therapy [facility] for tasks assessed/performed               Past Medical History:  Diagnosis Date   Anginal pain ([place])    Brown recluse spider bite    Cancer ([place])    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, LEFT ARM    COPD (chronic obstructive pulmonary disease) ([place])    Diabetes mellitus without complication ([place])    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[place])    Elevated lipids    Hypertension    Obesity    Pulmonary arterial hypertension ([place])    Sleep apnea    Past Surgical History:  Procedure Laterality Date   ANKLE FRACTURE SURGERY     BASAL CELL CARCINOMA EXCISION N/A    scalp   COLONOSCOPY W/ POLYPECTOMY     COLONOSCOPY WITH PROPOFOL N/A 01/18/2016   Procedure: COLONOSCOPY WITH PROPOFOL;  Surgeon: Lollie Sails, MD;  Location: [facility] ENDOSCOPY;  Service: Endoscopy;  Laterality: N/A;   COLONOSCOPY WITH PROPOFOL N/A 07/02/2018   Procedure: COLONOSCOPY WITH PROPOFOL;  Surgeon: Lollie Sails, MD;  Location: [facility] ENDOSCOPY;  Service: Endoscopy;  Laterality: N/A;   ESOPHAGOGASTRODUODENOSCOPY (EGD) WITH PROPOFOL N/A 01/18/2016   Procedure: ESOPHAGOGASTRODUODENOSCOPY (EGD) WITH PROPOFOL;  Surgeon: Lollie Sails, MD;  Location: [facility] ENDOSCOPY;  Service: Endoscopy;  Laterality: N/A;   FRACTURE SURGERY Right 1984   ankle   HERNIA REPAIR     UMBILICAL   LEG SURGERY Left    NASAL SEPTOPLASTY W/ TURBINOPLASTY Bilateral 11/06/2019   Procedure: NASAL SEPTOPLASTY WITH INFERIOR TURBINATE REDUCTION;  Surgeon: Margaretha Sheffield, MD;   Location: [facility] ORS;  Service: ENT;  Laterality: Bilateral;   toenail removal Bilateral    Patient Active Problem List   Diagnosis Date Noted   Pain in joint, ankle and foot 10/19/2021   Lymphedema 10/19/2021   Spinal stenosis of cervical region 08/26/2021   Bilateral hearing loss 04/10/2020   Difficulty walking 04/10/2020   Sensory ataxia 04/10/2020   B12 deficiency 02/03/2020   Leg pain, right 08/26/2019   Chronic venous insufficiency 08/26/2019   Venous insufficiency 08/26/2019   PAD (peripheral artery disease) ([place]) 08/14/2019   PVD (peripheral vascular disease) ([place]) 08/14/2019   Obstructive sleep apnea 05/06/2019   Obesity, Class II, BMI 35-39.9 03/11/2019   Type 2 diabetes mellitus with stage 3a chronic kidney disease, without long-term current use of insulin ([place]) 03/11/2019   Pain in joint involving ankle and foot 11/12/2018   Nuclear sclerotic cataract of both eyes 08/30/2018   Abnormal sensation in left ear 08/20/2018   Dizziness 08/20/2018   Numbness and tingling of left side of face 08/20/2018   Essential hypertension with goal blood pressure less than 130/80 03/19/2018   Healthcare maintenance 08/07/2017   Brown recluse spider bite 05/08/2017   SCC (squamous cell carcinoma), 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/2015   Severe obesity (BMI 35.0-35.9 with comorbidity) ([place]) 06/01/2015   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[place]) 09/29/2013   Hyperlipidemia associated with type 2  diabetes mellitus ([place]) 09/29/2013    PCP: Kirk Ruths, MD  REFERRING PROVIDER: Diamond Nickel, DO  REFERRING DIAGNOSIS: M25.511 (ICD-10-CM) - Pain in right shoulder W19.XXXA (ICD-10-CM) - Unspecified fall, initial encounter [phone] (ICD-10-CM) - Other muscle spasm M75.41 (ICD-10-CM) - Impingement syndrome of right shoulder   THERAPY DIAG: Right shoulder pain, unspecified chronicity  RATIONALE FOR EVALUATION AND TREATMENT: Rehabilitation  ONSET DATE: 06/23/21 (approximate)  FOLLOW UP APPT WITH PROVIDER: Yes,  returns in June 2023 to see [name]   From initial evaluation  SUBJECTIVE:                                                                                                                                                                                         Chief Complaint: R shoulder pain  Pertinent History Pt referred to PT for R shoulder pain by orthopedics. His pain began after a fall at the end of March 2023. He was walking down steps and started leaning forward. He responded by leaning back and he fell backwards bracing himself with his arms. He experienced immediate R shoulder pain. He denies any additional falls in the last 6 months but he did have a fall about 1 year ago. He had an evaluation of his R shoulder pain at his PCP office. Plain films of his thoracic spine and ribs were obtained which did not show any rib fractures but multilevel disc changes in his thoracic spine. He reports that prior to his fall he was receiving physical therapy for his cervical stenosis with notable improvement in his pain and mobility. He was last evaluated by physiatry on 08/26/2021 for his neck pain that radiates into his right arm. This was suspected to be due to his nerve root impingement seen on recent cervical spine MRI. There is also question of carpal tunnel syndrome. He was recommended EMG of the bilateral upper extremities and epidural steroid injection to his cervical spine. Pt states that [facility] PT told him there were "unable to see him" for his R shoulder pain. Since his fall he has had more pain along his posterior right shoulder. The pain is "under my shoulder blade" and it radiates around his right trunk. He points to his lateral R ribs, scapula, and periscapular area mostly.  He denies associated swelling, shoulder locking/catching, shoulder instability, weakness, fevers or chills, night sweats, weight loss, skin color change. He has tried acetaminophen, [place] dream cream,  activity modification, chronic medication nortriptyline/pregabalin/tramadol with continued symptoms. He is right hand dominant and is retired from being a Art gallery manager. He also was previously in the [employer]   Pain:  Pain Intensity: Present: 0/10,  Best: 0/10, Worst: 7/10 Pain location: R thoracic paraspinal/periscapular region Pain Quality: dull  Radiating: Yes, around the R ribs  Numbness/Tingling: No, He reports questionable minor numbness around R ribs Focal Weakness: No, not related to current R shoulder pain Aggravating factors: leaning to the left, standing forward from a chair, bending forward "makes me feel drunk," reaching out with RUE; Relieving factors: Tylenol, topical analgesic provides temporary relief, no help with ice/heat,  24-hour pain behavior: "All day long" History of prior shoulder or neck/shoulder injury, pain, surgery, or therapy: Yes, see history Falls: Has patient fallen in last 6 months? Yes, Number of falls: 1 Dominant hand: right Imaging: Yes, Right Shoulder Radiographs - 3 views ([place], Axillary, Scapular Y) performed 08/31/2021: Type I acromion. Mild AC joint degenerative change with osteophyte formation. The distal clavicle, scapula, and proximal humerus are intact. Glenohumeral and acromioclavicular joint alignment appears normal. No fractures or dislocations. Well maintained glenohumeral joint space without evidence of degenerative joint disease. No soft tissue swelling or joint effusion. No destructive bony lesion is identified.  Cervical spine MRI without contrast performed 04/15/2021 IMPRESSION: Cervical spondylosis, as outlined and with findings most notably as follows: At C5-C6, a moderate-sized central disc protrusion contributes to multifactorial severe spinal canal stenosis with moderate spinal cord flattening. No definite spinal cord signal abnormality is identified, although motion degradation limits evaluation. Multifactorial bilateral neural foraminal  narrowing (moderate right, severe left). No more than mild relative spinal canal narrowing at the remaining levels. Of note, a right center disc protrusion contacts the ventral spinal cord at C6-C7. Additional sites of foraminal stenosis, as detailed and greatest on the left at C3-C4 (moderate) and on the right at C6-C7 (moderate).    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  Recent memory is intact.  Remote memory is intact.  Attention span and concentration are intact.  Expressive speech is intact.  Patient's fund of knowledge is within normal limits for educational level. Conversation is tangential with frequent diversions     Gross Musculoskeletal Assessment Tremor: None Bulk: Normal Tone: Normal Overweight  Gait Full gait assessment deferred  Posture Forward head/rounded shoulders in sitting and standing   Cervical Screen Spurlings A (ipsilateral lateral flexion/axial compression): R: Positive for reproduction of periscapular pain L: Positive Spurlings B (ipsilateral lateral flexion/contralateral rotation/axial compression): R: Positive for reproduction of periscapular pain L: Positive Repeated movement: Not performed Hoffman Sign (cervical cord compression): R: Negative L: Negative ULTT Median: R: Not examined L: Not examined ULTT Ulnar: R: Not examined L: Not examined ULTT Radial: R: Not examined L: Not examined   AROM  AROM  (Normal range in degrees) AROM 12/03/2021  Cervical  Flexion (50) 43  Extension (80) 23*  Right lateral flexion (45) 35*  Left lateral flexion (45) 35  Right rotation (85)   Left rotation (85)    Right Left  Shoulder    Flexion    Extension    Abduction    External Rotation    Internal Rotation  Hands Behind Head    Hands Behind Back        Elbow    Flexion    Extension    Pronation    Supination    (* = pain; Blank rows = not tested)    LE MMT:  MMT (out of 5) Right 12/03/2021 Left 12/03/2021  Cervical (isometric)  Flexion Weak  Extension Weak  Lateral Flexion Weak Weak  Rotation Weak Weak      Shoulder   Flexion 4 4  Extension    Abduction 4+ 4  External rotation 3+* 3+  Internal rotation 5 5  Horizontal abduction    Horizontal adduction    Lower Trapezius    Rhomboids        Elbow  Flexion 5 5  Extension 5 5  Pronation    Supination        Wrist  Flexion 5 5  Extension 5 5  Radial deviation    Ulnar deviation        MCP  Flexion 5 5  Extension 5 5  Abduction    Adduction    (* = pain; Blank rows = not tested)  Positive reproduction of concordant pain with resisted L shoulder ER  Sensation Deferred  Reflexes Deferred   Palpation  Location LEFT  RIGHT           Subocciptials  0  Cervical paraspinals  2  Upper Trapezius    Levator Scapulae    Rhomboid Major/Minor  2  Sternoclavicular joint    Acromioclavicular joint    Coracoid process    Long head of biceps  2  Supraspinatus    Infraspinatus    Subscapularis    Teres Minor    Teres Major    Pectoralis Major    Pectoralis Minor    Anterior Deltoid  1  Lateral Deltoid    Posterior Deltoid    Latissimus Dorsi    Sternocleidomastoid    (Blank rows = not tested) Graded on 0-4 scale (0 = no pain, 1 = pain, 2 = pain with wincing/grimacing/flinching, 3 = pain with withdrawal, 4 = unwilling to allow palpation), (Blank rows = not tested)   Repeated  Movements Deferred  Passive Accessory Intervertebral Motion Deferred   Accessory Motions/Glides Deferred   SPECIAL TESTS Rotator Cuff  Drop Arm Test: Negative Painful Arc (Pain from 60 to 120 degrees scaption): Negative Infraspinatus Muscle Test: Positive   Subacromial Impingement Deferred  Labral Tear Deferred  Bicep Tendon Pathology Deferred  Shoulder Instability Deferred   Beighton scale: Deferred  FROM 09/23/21 VISIT:  AROM  AROM (Normal range in degrees) AROM 09/23/2021  Cervical  Flexion (50) 43  Extension (80) 23*  Right lateral flexion (45) 35*  Left lateral flexion (45) 35  Right rotation (85)   Left rotation (85)    Right Left  Shoulder    Flexion 160* 160*  Extension    Abduction 145* 120*  External Rotation 60* 55*  Internal Rotation 86 85  Hands Behind Head    Hands Behind Back        Elbow    Flexion WNL   Extension WNL   Pronation    Supination    (* = pain; Blank rows = not tested)  Passive Accessory Intervertebral Motion Pt denies reproduction of shoulder pain with CPA C2-T7 and R UPA C2-T7 however he is tender centrally to pressure. Generally, hypomobile throughout however difficult to assess mobility secondary to pain;  Accessory Motions/Glides  Glenohumeral: Posterior: R: Painful, unable to fully assess L: not examined Inferior: R: abnormal, hypomobile and painful L: not examined Anterior: R: normal L: not examined  Acromioclavicular:  Posterior: R: Too painful to assess fully  L: not examined Anterior: R: Too painful to assess fully L: not examined  Sternoclavicular: Posterior: R: Too painful to assess fully L: not examined Anterior: R: Too painful to assess fully L: not examined Superior: R: Too painful to assess fully L: not examined Inferior: R: Too painful to assess fully L: not examined  Scapulothoracic: Painful in all directions so unable to fully assess mobility;   Muscle Length Testing Deferred   SPECIAL  TESTS (all on right side)  Subacromial Impingement Hawkins-Kennedy: Positive Neer (Block scapula, PROM flexion): Positive Painful Arc (Pain from 60 to 120 degrees scaption): Negative Empty Can: Positive External Rotation Resistance: Positive Horizontal Adduction: Negative Scapular Assist: Not examined  Labral Tear Biceps Load II (120 elevation, full ER, 90 elbow flexion, full supination, resisted elbow flexion): Negative Crank (160 scaption, axial load with IR/ER): Positive Active Compression Test: Negative  Bicep Tendon Pathology Speed (shoulder flexion to 90, external rotation, full elbow extension, and forearm supination with resistance: Negative Yergason's (resisted shoulder ER and supination/biceps tendon pathology): Not examined  Shoulder Instability Sulcus Sign: Positive Anterior Apprehension: Negative    TODAY'S TREATMENT   SUBJECTIVE: Pt reports that he is doing alright today. He has continued taking the Flexeril to help with his pain. Denies pain upon arrival today. No further episodes of vertigo. Pt had his cervical MRI but states that he doesn't understand the results. No concerns currently.    PAIN: Denies pain at rest    Ther-ex  UBE x 4 minutes (2.5 forward/2.5 backwards) for warm-up, unbilled; Standing Nautilus shoulder extension 40# 3 x 10; Seated Nautilus lat pull down 110# x 10, decreased weight to 95# due to increase in pain 2 x 10; Seated scapular retractions 3 x 10 with tactile cues for proper technique; Seated "W's" with green tband 3 x 10; Seated Nautilus rows with 60# 2 x 20;   Not performed today: Supine R shoulder flexion with 5# dumbbell (DB) 3 x 15; Supine R shoulder circles (CW and CCW) with 5# DB 3 x 10 each direction; Supine R shoulder serratus punch with 5# DB 2 x 15; Supine R elbow flexion with 5# DB 2 x 15; L sidelying R shoulder abduction with 5# DB 2 x 15; L sidelying R shoulder ER with 5# DB 2 x 15; Standing Nautilus chest press  50# x 10, 60# 2 x 10; Seated shoulder flexion with 3# DB 3 x 15 BUE; Seated shoulder scaption with 3# DB 3 x 15 BUE; Seated overhead shoulder press with 3# DB x 10, discontinued secondary to increase in flank pain;    PATIENT EDUCATION:  Education details: Pt educated throughout session about proper posture and technique with exercises. Improved exercise technique, movement at target joints, use of target muscles after min to mod verbal, visual, tactile cues. Person educated: Patient Education method: Explanation, verbal cues, tactile cues, and handout Education comprehension: verbalized understanding,    HOME EXERCISE PROGRAM: Access Code: [email or web address] URL: [email or web address] Date: 09/23/2021 Prepared by: Roxana Hires  Exercises - Seated Scapular Retraction  - 1 x daily - 7 x weekly - 2 sets - 10 reps - 2s hold - Standing Shoulder Row with Anchored Resistance  - 1 x daily - 7 x weekly - 2 sets - 10 reps - 2s hold - Shoulder  W - External Rotation with Resistance  - 1 x daily - 7 x weekly - 2 sets - 10 reps - 2s hold   ASSESSMENT:  CLINICAL IMPRESSION:   Continued with pain-free strengthening today. Pt arrived late so session was abbreviated accordingly. His cervical MRI showed stable degenerative changes most advanced at C5-C6 where there is unchanged severe spinal canal stenosis and severe left and moderate right neural foraminal stenosis. No definite cord signal abnormality. Unchanged moderate to severe left neural foraminal stenosis at C3-C4. MRI results not reviewed with patient pt encouraged to follow-up with ordering provider to discuss results and significance. Pt encouraged to continue HEP and follow-up as scheduled. He will need a progress note at next visit. He will continue to benefit from PT services to address deficits in strength, pain, and range of motion in order to return to full function at home.   REHAB POTENTIAL: Fair secondary to multiple comorbidities  and ongoing chronic neck pain  CLINICAL DECISION MAKING: Unstable/unpredictable  EVALUATION COMPLEXITY: High   GOALS: Goals reviewed with patient? Yes  SHORT TERM GOALS: Target date: 12/07/2021   Pt will be independent with HEP to improve strength and decrease shoulder pain to improve pain-free function at home and work. Baseline:  Goal status: ONGOING   LONG TERM GOALS: Target date: 01/04/2022   Pt will increase FOTO to at least 62 to demonstrate significant improvement in function at home and work related to neck pain  Baseline: 09/15/21: 50; 10/21/21: 53 Goal status: PARTIALLY MET  2.  Pt will decrease worst shoulder pain by at least 3 points on the NPRS in order to demonstrate clinically significant reduction in shoulder pain. Baseline: 09/15/21: worst: 7/10; 10/21/21: worst: 7/10; Goal status: ONGOING  3.  Pt will decrease quick DASH score by at least 8% in order to demonstrate clinically significant reduction in disability related to shoulder pain        Baseline: 09/15/21: 37.5%, 10/21/21: 45.5%  Goal status: ONGOING  4. Pt will increase strength of pain-free R shoulder external rotation to at least 4/5 in order to demonstrate improvement in strength and function of RUE       Baseline: 09/15/21: 3+/5 painful, 10/21/21: 4+/5 with minimal pain Goal status: PARTIALLY MET   PLAN: PT FREQUENCY: 1-2x/week  PT DURATION: 8 weeks  PLANNED INTERVENTIONS: Therapeutic exercises, Therapeutic activity, Neuromuscular re-education, Balance training, Gait training, Patient/Family education, Joint manipulation, Joint mobilization, Vestibular training, Canalith repositioning, Aquatic Therapy, Dry Needling, Electrical stimulation, Spinal manipulation, Spinal mobilization, Cryotherapy, Moist heat, Traction, Ultrasound, Ionotophoresis 4mg /ml Dexamethasone, and Manual therapy  PLAN FOR NEXT SESSION:  Progress note, continue strengthening , review and modify HEP as needed  Lyndel Safe Imanol Bihl PT, DPT,  GCS  Chyler Creely 12/03/2021, 9:32 AM

## 2021-12-03 ENCOUNTER — Ambulatory Visit: Payer: Medicare Other

## 2021-12-03 DIAGNOSIS — M25511 Pain in right shoulder: Secondary | ICD-10-CM

## 2021-12-06 NOTE — Therapy (Unsigned)
OUTPATIENT PHYSICAL THERAPY SHOULDER TREATMENT/PROGRESS NOTE  Dates of reporting period  10/21/21   to   12/08/21    Patient Name: Bobby Chen MRN: [id number] DOB:1945/08/21, 76 y.o., male Today's Date: 12/09/2021   PT End of Session - 12/08/21 1410     Visit Number 20    Number of Visits 33    Date for PT Re-Evaluation 01/04/22    Authorization Type eval: 7/35/32, recertification 9/92/42    PT Start Time 1405    PT Stop Time 1445    PT Time Calculation (min) 40 min    Activity Tolerance Patient tolerated treatment well    Behavior During Therapy [facility] for tasks assessed/performed                Past Medical History:  Diagnosis Date   Anginal pain ([place])    Brown recluse spider bite    Cancer ([place])    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, LEFT ARM    COPD (chronic obstructive pulmonary disease) ([place])    Diabetes mellitus without complication ([place])    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[place])    Elevated lipids    Hypertension    Obesity    Pulmonary arterial hypertension ([place])    Sleep apnea    Past Surgical History:  Procedure Laterality Date   ANKLE FRACTURE SURGERY     BASAL CELL CARCINOMA EXCISION N/A    scalp   COLONOSCOPY W/ POLYPECTOMY     COLONOSCOPY WITH PROPOFOL N/A 01/18/2016   Procedure: COLONOSCOPY WITH PROPOFOL;  Surgeon: Lollie Sails, MD;  Location: [facility] ENDOSCOPY;  Service: Endoscopy;  Laterality: N/A;   COLONOSCOPY WITH PROPOFOL N/A 07/02/2018   Procedure: COLONOSCOPY WITH PROPOFOL;  Surgeon: Lollie Sails, MD;  Location: [facility] ENDOSCOPY;  Service: Endoscopy;  Laterality: N/A;   ESOPHAGOGASTRODUODENOSCOPY (EGD) WITH PROPOFOL N/A 01/18/2016   Procedure: ESOPHAGOGASTRODUODENOSCOPY (EGD) WITH PROPOFOL;  Surgeon: Lollie Sails, MD;  Location: [facility] ENDOSCOPY;  Service: Endoscopy;  Laterality: N/A;   FRACTURE SURGERY Right 1984   ankle   HERNIA REPAIR     UMBILICAL   LEG SURGERY Left    NASAL SEPTOPLASTY W/ TURBINOPLASTY Bilateral 11/06/2019   Procedure: NASAL SEPTOPLASTY  WITH INFERIOR TURBINATE REDUCTION;  Surgeon: Margaretha Sheffield, MD;  Location: [facility] ORS;  Service: ENT;  Laterality: Bilateral;   toenail removal Bilateral    Patient Active Problem List   Diagnosis Date Noted   Pain in joint, ankle and foot 10/19/2021   Lymphedema 10/19/2021   Spinal stenosis of cervical region 08/26/2021   Bilateral hearing loss 04/10/2020   Difficulty walking 04/10/2020   Sensory ataxia 04/10/2020   B12 deficiency 02/03/2020   Leg pain, right 08/26/2019   Chronic venous insufficiency 08/26/2019   Venous insufficiency 08/26/2019   PAD (peripheral artery disease) ([place]) 08/14/2019   PVD (peripheral vascular disease) ([place]) 08/14/2019   Obstructive sleep apnea 05/06/2019   Obesity, Class II, BMI 35-39.9 03/11/2019   Type 2 diabetes mellitus with stage 3a chronic kidney disease, without long-term current use of insulin ([place]) 03/11/2019   Pain in joint involving ankle and foot 11/12/2018   Nuclear sclerotic cataract of both eyes 08/30/2018   Abnormal sensation in left ear 08/20/2018   Dizziness 08/20/2018   Numbness and tingling of left side of face 08/20/2018   Essential hypertension with goal blood pressure less than 130/80 03/19/2018   Healthcare maintenance 08/07/2017   Brown recluse spider bite 05/08/2017   SCC (squamous cell carcinoma), 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/2015   Severe obesity (BMI 35.0-35.9 with  comorbidity) ([place]) 06/01/2015   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[place]) 09/29/2013   Hyperlipidemia associated with type 2 diabetes mellitus ([place]) 09/29/2013    PCP: Kirk Ruths, MD  REFERRING PROVIDER: Diamond Nickel, DO  REFERRING DIAGNOSIS: M25.511 (ICD-10-CM) - Pain in right shoulder W19.XXXA (ICD-10-CM) - Unspecified fall, initial encounter [phone] (ICD-10-CM) - Other muscle spasm M75.41 (ICD-10-CM) - Impingement syndrome of right shoulder   THERAPY DIAG: Right shoulder pain, unspecified chronicity  RATIONALE FOR EVALUATION AND TREATMENT: Rehabilitation  ONSET DATE:  06/23/21 (approximate)  FOLLOW UP APPT WITH PROVIDER: Yes, returns in June 2023 to see [name]   From initial evaluation  SUBJECTIVE:                                                                                                                                                                                         Chief Complaint: R shoulder pain  Pertinent History Pt referred to PT for R shoulder pain by orthopedics. His pain began after a fall at the end of March 2023. He was walking down steps and started leaning forward. He responded by leaning back and he fell backwards bracing himself with his arms. He experienced immediate R shoulder pain. He denies any additional falls in the last 6 months but he did have a fall about 1 year ago. He had an evaluation of his R shoulder pain at his PCP office. Plain films of his thoracic spine and ribs were obtained which did not show any rib fractures but multilevel disc changes in his thoracic spine. He reports that prior to his fall he was receiving physical therapy for his cervical stenosis with notable improvement in his pain and mobility. He was last evaluated by physiatry on 08/26/2021 for his neck pain that radiates into his right arm. This was suspected to be due to his nerve root impingement seen on recent cervical spine MRI. There is also question of carpal tunnel syndrome. He was recommended EMG of the bilateral upper extremities and epidural steroid injection to his cervical spine. Pt states that [facility] PT told him there were "unable to see him" for his R shoulder pain. Since his fall he has had more pain along his posterior right shoulder. The pain is "under my shoulder blade" and it radiates around his right trunk. He points to his lateral R ribs, scapula, and periscapular area mostly.  He denies associated swelling, shoulder locking/catching, shoulder instability, weakness, fevers or chills, night sweats, weight loss, skin color change.  He has tried acetaminophen, [place] dream cream, activity modification, chronic medication nortriptyline/pregabalin/tramadol with continued symptoms. He is right hand dominant and is retired from being a  Art gallery manager. He also was previously in the [employer]   Pain:  Pain Intensity: Present: 0/10, Best: 0/10, Worst: 7/10 Pain location: R thoracic paraspinal/periscapular region Pain Quality: dull  Radiating: Yes, around the R ribs  Numbness/Tingling: No, He reports questionable minor numbness around R ribs Focal Weakness: No, not related to current R shoulder pain Aggravating factors: leaning to the left, standing forward from a chair, bending forward "makes me feel drunk," reaching out with RUE; Relieving factors: Tylenol, topical analgesic provides temporary relief, no help with ice/heat,  24-hour pain behavior: "All day long" History of prior shoulder or neck/shoulder injury, pain, surgery, or therapy: Yes, see history Falls: Has patient fallen in last 6 months? Yes, Number of falls: 1 Dominant hand: right Imaging: Yes, Right Shoulder Radiographs - 3 views (Grashey, Axillary, Scapular Y) performed 08/31/2021: Type I acromion. Mild AC joint degenerative change with osteophyte formation. The distal clavicle, scapula, and proximal humerus are intact. Glenohumeral and acromioclavicular joint alignment appears normal. No fractures or dislocations. Well maintained glenohumeral joint space without evidence of degenerative joint disease. No soft tissue swelling or joint effusion. No destructive bony lesion is identified.  Cervical spine MRI without contrast performed 04/15/2021 IMPRESSION: Cervical spondylosis, as outlined and with findings most notably as follows: At C5-C6, a moderate-sized central disc protrusion contributes to multifactorial severe spinal canal stenosis with moderate spinal cord flattening. No definite spinal cord signal abnormality is identified, although motion degradation limits  evaluation. Multifactorial bilateral neural foraminal narrowing (moderate right, severe left). No more than mild relative spinal canal narrowing at the remaining levels. Of note, a right center disc protrusion contacts the ventral spinal cord at C6-C7. Additional sites of foraminal stenosis, as detailed and greatest on the left at C3-C4 (moderate) and on the right at C6-C7 (moderate).    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  Recent memory is intact.  Remote memory is intact.  Attention span and concentration are intact.  Expressive speech is intact.  Patient's fund of knowledge is within normal limits for educational level. Conversation is tangential with frequent diversions     Gross Musculoskeletal Assessment Tremor: None Bulk: Normal Tone: Normal Overweight  Gait Full gait assessment deferred  Posture Forward head/rounded shoulders in sitting and standing   Cervical Screen Spurlings A (ipsilateral lateral flexion/axial compression): R: Positive for reproduction of periscapular pain L: Positive Spurlings B (ipsilateral lateral flexion/contralateral rotation/axial compression): R: Positive for reproduction of periscapular pain L: Positive Repeated movement: Not performed Hoffman Sign (cervical cord compression): R: Negative L: Negative ULTT Median: R: Not examined L: Not examined ULTT Ulnar: R: Not examined L: Not examined ULTT  Radial: R: Not examined L: Not examined   AROM  AROM (Normal range in degrees) AROM 12/09/2021  Cervical  Flexion (50) 43  Extension (80) 23*  Right lateral flexion (45) 35*  Left lateral flexion (45) 35  Right rotation (85)   Left rotation (85)    Right Left  Shoulder    Flexion    Extension  Abduction    External Rotation    Internal Rotation    Hands Behind Head    Hands Behind Back        Elbow    Flexion    Extension    Pronation    Supination    (* = pain; Blank rows = not tested)    LE MMT:  MMT (out of 5) Right 12/09/2021 Left 12/09/2021  Cervical (isometric)  Flexion Weak  Extension Weak  Lateral Flexion Weak Weak  Rotation Weak Weak      Shoulder   Flexion 4 4  Extension    Abduction 4+ 4  External rotation 3+* 3+  Internal rotation 5 5  Horizontal abduction    Horizontal adduction    Lower Trapezius    Rhomboids        Elbow  Flexion 5 5  Extension 5 5  Pronation    Supination        Wrist  Flexion 5 5  Extension 5 5  Radial deviation    Ulnar deviation        MCP  Flexion 5 5  Extension 5 5  Abduction    Adduction    (* = pain; Blank rows = not tested)  Positive reproduction of concordant pain with resisted L shoulder ER  Sensation Deferred  Reflexes Deferred   Palpation  Location LEFT  RIGHT           Subocciptials  0  Cervical paraspinals  2  Upper Trapezius    Levator Scapulae    Rhomboid Major/Minor  2  Sternoclavicular joint    Acromioclavicular joint    Coracoid process    Long head of biceps  2  Supraspinatus    Infraspinatus    Subscapularis    Teres Minor    Teres Major    Pectoralis Major    Pectoralis Minor    Anterior Deltoid  1  Lateral Deltoid    Posterior Deltoid    Latissimus Dorsi    Sternocleidomastoid    (Blank rows = not tested) Graded on 0-4 scale (0 = no pain, 1 = pain, 2 = pain with wincing/grimacing/flinching, 3 = pain with withdrawal, 4 = unwilling to allow palpation),  (Blank rows = not tested)   Repeated Movements Deferred  Passive Accessory Intervertebral Motion Deferred   Accessory Motions/Glides Deferred   SPECIAL TESTS Rotator Cuff  Drop Arm Test: Negative Painful Arc (Pain from 60 to 120 degrees scaption): Negative Infraspinatus Muscle Test: Positive   Subacromial Impingement Deferred  Labral Tear Deferred  Bicep Tendon Pathology Deferred  Shoulder Instability Deferred   Beighton scale: Deferred  FROM 09/23/21 VISIT:  AROM  AROM (Normal range in degrees) AROM 09/23/2021  Cervical  Flexion (50) 43  Extension (80) 23*  Right lateral flexion (45) 35*  Left lateral flexion (45) 35  Right rotation (85)   Left rotation (85)    Right Left  Shoulder    Flexion 160* 160*  Extension    Abduction 145* 120*  External Rotation 60* 55*  Internal Rotation 86 85  Hands Behind Head    Hands Behind Back        Elbow    Flexion WNL   Extension WNL   Pronation    Supination    (* = pain; Blank rows = not tested)  Passive Accessory Intervertebral Motion Pt denies reproduction of shoulder pain with CPA C2-T7 and R UPA C2-T7 however he is tender centrally to pressure.  Generally, hypomobile throughout however difficult to assess mobility secondary to pain;  Accessory Motions/Glides Glenohumeral: Posterior: R: Painful, unable to fully assess L: not examined Inferior: R: abnormal, hypomobile and painful L: not examined Anterior: R: normal L: not examined  Acromioclavicular:  Posterior: R: Too painful to assess fully  L: not examined Anterior: R: Too painful to assess fully L: not examined  Sternoclavicular: Posterior: R: Too painful to assess fully L: not examined Anterior: R: Too painful to assess fully L: not examined Superior: R: Too painful to assess fully L: not examined Inferior: R: Too painful to assess fully L: not examined  Scapulothoracic: Painful in all directions so unable to fully assess  mobility;   Muscle Length Testing Deferred   SPECIAL TESTS (all on right side)  Subacromial Impingement Hawkins-Kennedy: Positive Neer (Block scapula, PROM flexion): Positive Painful Arc (Pain from 60 to 120 degrees scaption): Negative Empty Can: Positive External Rotation Resistance: Positive Horizontal Adduction: Negative Scapular Assist: Not examined  Labral Tear Biceps Load II (120 elevation, full ER, 90 elbow flexion, full supination, resisted elbow flexion): Negative Crank (160 scaption, axial load with IR/ER): Positive Active Compression Test: Negative  Bicep Tendon Pathology Speed (shoulder flexion to 90, external rotation, full elbow extension, and forearm supination with resistance: Negative Yergason's (resisted shoulder ER and supination/biceps tendon pathology): Not examined  Shoulder Instability Sulcus Sign: Positive Anterior Apprehension: Negative    TODAY'S TREATMENT   SUBJECTIVE: Pt reports that he is doing alright today. Denies pain upon arrival today. No further episodes of vertigo. He saw Dr. Cari Caraway yesterday who recommended continue to watch his symptoms and follow-up in 3 months given that he was not having any overt symptoms related to myelopathy.   PAIN: Denies pain at rest    Ther-ex  Pre-exercise vitals: BP: 165/86, HR: 92, SpO2: 96%  UBE x 4 minutes (2 forward/2. backwards) for warm-up, unbilled; Updated outcome measures with patient: Worst pain: 4/10; FOTO: 72 QuickDASH: 63.6% R shoulder ER MMT: 4+/5 with pain;  Supine R shoulder flexion with 6# dumbbell (DB) 3 x 15; Supine R shoulder circles (CW and CCW) with 6# DB 3 x 15 each direction; Supine R shoulder serratus punch with 6# DB 2 x 15; Supine R elbow flexion with 6# DB 2 x 15; Seated scapular retractions 2 x 10 with tactile cues for proper technique; Attempted L sidelying R shoulder abduction and ER but discontinued secondary to pain;   Not performed today: Seated shoulder  flexion with 3# DB 3 x 15 BUE; Seated shoulder scaption with 3# DB 3 x 15 BUE; Seated overhead shoulder press with 3# DB x 10, discontinued secondary to increase in flank pain; Seated "W's" with green tband 3 x 10; Seated Nautilus rows with 60# 2 x 20; Seated Nautilus lat pull down 110# x 10, decreased weight to 95# due to increase in pain 2 x 10; Standing Nautilus chest press 50# x 10, 60# 2 x 10; Standing Nautilus shoulder extension 40# 3 x 10;   PATIENT EDUCATION:  Education details: Pt educated throughout session about proper posture and technique with exercises. Improved exercise technique, movement at target joints, use of target muscles after min to mod verbal, visual, tactile cues. Person educated: Patient Education method: Explanation, verbal cues, tactile cues, and handout Education comprehension: verbalized understanding,    HOME EXERCISE PROGRAM: Access Code: [email or web address] URL: [email or web address] Date: 09/23/2021 Prepared by: Roxana Hires  Exercises - Seated Scapular Retraction  - 1 x daily - 7 x weekly - 2  sets - 10 reps - 2s hold - Standing Shoulder Row with Anchored Resistance  - 1 x daily - 7 x weekly - 2 sets - 10 reps - 2s hold - Shoulder W - External Rotation with Resistance  - 1 x daily - 7 x weekly - 2 sets - 10 reps - 2s hold   ASSESSMENT:  CLINICAL IMPRESSION: Outcome measures and goals updated with patient. His QuickDASH is worse today at 63.6% however his FOTO improved considerably from 53 to 72. His R shoulder ER strength is unchanged since last update and still remains painful. At this point his worst pain only increases to 4/10 compared to 7/10 at last update. Continued with pain-free strengthening for the rest of session today. Pt encouraged to continue HEP and follow-up as scheduled. He will continue to benefit from PT services to address deficits in strength, pain, and range of motion in order to return to full function at home.    REHAB  POTENTIAL: Fair secondary to multiple comorbidities and ongoing chronic neck pain  CLINICAL DECISION MAKING: Unstable/unpredictable  EVALUATION COMPLEXITY: High   GOALS: Goals reviewed with patient? Yes  SHORT TERM GOALS: Target date: 12/07/2021   Pt will be independent with HEP to improve strength and decrease shoulder pain to improve pain-free function at home and work. Baseline:  Goal status: ONGOING   LONG TERM GOALS: Target date: 01/04/2022   Pt will increase FOTO to at least 62 to demonstrate significant improvement in function at home and work related to neck pain  Baseline: 09/15/21: 50; 10/21/21: 53; 12/08/21: 72 Goal status: ACHIEVED  2.  Pt will decrease worst shoulder pain by at least 3 points on the NPRS in order to demonstrate clinically significant reduction in shoulder pain. Baseline: 09/15/21: worst: 7/10; 10/21/21: worst: 7/10, 12/08/21: 4/10; Goal status: ACHIEVED  3.  Pt will decrease QuickDASH score by at least 8% in order to demonstrate clinically significant reduction in disability related to shoulder pain        Baseline: 09/15/21: 37.5%, 10/21/21: 45.5%; 12/08/21: 63.6% Goal status: ONGOING  4. Pt will increase strength of pain-free R shoulder external rotation to at least 4/5 in order to demonstrate improvement in strength and function of RUE       Baseline: 09/15/21: 3+/5 painful, 10/21/21: 4+/5 with minimal pain, 12/08/21: 4+/5 with pain; Goal status: PARTIALLY MET   PLAN: PT FREQUENCY: 1-2x/week  PT DURATION: 8 weeks  PLANNED INTERVENTIONS: Therapeutic exercises, Therapeutic activity, Neuromuscular re-education, Balance training, Gait training, Patient/Family education, Joint manipulation, Joint mobilization, Vestibular training, Canalith repositioning, Aquatic Therapy, Dry Needling, Electrical stimulation, Spinal manipulation, Spinal mobilization, Cryotherapy, Moist heat, Traction, Ultrasound, Ionotophoresis 62m/ml Dexamethasone, and Manual therapy  PLAN FOR  NEXT SESSION:  continue strengthening , review and modify HEP as needed  JLyndel SafeHuprich PT, DPT, GCS  Taegan Standage 12/09/2021, 1:24 PM

## 2021-12-07 ENCOUNTER — Encounter: Payer: Self-pay | Admitting: Neurosurgery

## 2021-12-07 ENCOUNTER — Ambulatory Visit (INDEPENDENT_AMBULATORY_CARE_PROVIDER_SITE_OTHER): Payer: Medicare Other | Admitting: Neurosurgery

## 2021-12-07 VITALS — BP 134/84 | Ht 67.0 in | Wt 230.0 lb

## 2021-12-07 DIAGNOSIS — M4802 Spinal stenosis, cervical region: Secondary | ICD-10-CM

## 2021-12-07 NOTE — Progress Notes (Signed)
Progress Note: Referring Physician:  Kirk Ruths, MD [place],  [place] [id number]  Primary Physician:  Bobby Ruths, MD  Chief Complaint:  3 month f/u of cervical myelopathy   History of Present Illness: 12/07/2021 Bobby Chen returns to clinic.  He is having some issues with imbalance.  He was having vertigo, but he underwent Epley maneuvers at physical therapy office and feels better now.  He has no other symptoms of myelopathy.  10/28/21 from Bobby Chen note: Bobby Chen is a 76 y.o. male who presents for 3 month follow up of cervical stenosis and watch for symptoms of cervical myelopathy.  He was seen in early April and was doing fairly well at that time however he suffered a fall shortly after and has had increased right scapular pain worse with reaching forward and overhead he describes as sharp in nature.  In by his PCP and x-rays were obtained which were largely normal however his symptoms have persisted.  He also states that he has had more difficulty getting up from a seated position since this fall.  He states that he has had an increased number of falls over the last year but attributes this to his blood pressure and a recent diagnosis of vertigo.  He is currently scheduled to start physical therapy next week.  He denies any new or worsening radicular arm symptoms.  He does have a history of carpal tunnel and has constant numbness and tingling in his hands although states this has worsened since his fall in April.  LOV:  07/27/21 Bobby Chen is a 76 y.o. male with known cervical stenosis and neck who presents for 6 week follow up after PT. he reports increased range of motion in his neck however he does continue to have some right-sided neck pain and interscapular pain when rotating his head to the right. Physical therapy is working on this. He denies any radiating arm symptoms, numbness or tingling into his hands, changes  in dexterity, or weakness.  HPI by Dr. Izora Chen: 05/31/2021 Bobby Chen is here today with a chief complaint of neck pain/stiffness and limited ROM. He also reports pain that radiates down the middle of his upper back and into his ears bilaterally. He denies numbness, tingling, or trouble with dexterity. He reports he has had balance issues since breaking his right ankle in 1984.   He has been having issues since August 2022. He has pain towards the top of his neck and has difficulty with rotating to the left.    Bowel/Bladder Dysfunction: none   Conservative measures:  Physical therapy: has not participated Multimodal medical therapy including regular antiinflammatories:  Robaxin, Lyrica, Ultram Injections: has not received epidural steroid injections   Past Surgery: none   Bobby Chen has no symptoms of cervical myelopathy.   The symptoms are causing a significant impact on the patient's life.    HPI by Bobby Render, PA: 04/28/2021: Telephone call: "I spoke with Bobby Chen at length regarding his MRI results and concern for stenosis at C5-6. His primary complaint continues to be stiffness and neck pain with limited ROM to the left. He continues to be without any numbness, tingling, imbalance, or trouble with dexterity.  We did discuss further options regarding his cervical stenosis and I expressed that this was unlikely the cause of his neck pain but could manifest as myelopathy in the future. We discussed the symptoms of cervical myelopathy.  We discussed treatment options such as PT which his insurance still will not cover according to him, and injections which he is not interested in. He would like to meet with Dr. Izora Chen about his cervical stenosis and discuss surgical options.  He was encouraged to call the office in the interim with any questions or concerns." 04/01/2021 Bobby Chen is a 76 y.o with a history of diabetes, hypertension, chronic pain, here today at the  request of ENT for further evaluation of his neck.  He states that he has had neck pain that radiates down the middle of his upper back and into his ears bilaterally since August of this year without any inciting event.  He was recently seen by ENT who referred him to neurosurgery for further evaluation of "pitched nerves" in his cervical spine.  He denies any radiating arm pain.  He states that turning his head from side to side particularly when driving is more difficult and will occasionally cause the pain in his neck.  He also endorses some pain with looking up and down.  His primary complaint however is dizziness which seems to occur somewhat reasonably as well as ringing in his ears.  He underwent physical therapy for his right lower extremity and was referred to physical therapy for his neck and for gait training however his insurance not cover anymore sessions this year.  He denies any upper extremity weakness.  Of note he endorses several episodes of dried blood in his left ear.  He denies any trauma or using any penetrating objects.  Exam: Vitals:   12/07/21 1547  BP: 134/84    NEUROLOGICAL:  General: In no acute distress.   Awake, alert, oriented to person, place, and time.  Pupils equal round and reactive to light.  Facial tone is symmetric.  Tongue protrusion is midline.  There is no pronator drift.  ROM of spine: full.  Palpation of spine: nontender.    Strength: Side Biceps Triceps Deltoid Interossei Grip Wrist Ext. Wrist Flex.  R '5 5 5 5 5 5 5  '$ L '5 5 5 5 5 5 5    '$ MSK: Pain with abduction of the right shoulder  Imaging: 04/15/21 MRI C spine IMPRESSION: Cervical spondylosis, as outlined and with findings most notably as follows.   At C5-C6, a moderate-sized central disc protrusion contributes to multifactorial severe spinal canal stenosis with moderate spinal cord flattening. No definite spinal cord signal abnormality is identified, although motion degradation limits  evaluation. Multifactorial bilateral neural foraminal narrowing (moderate right, severe left).   No more than mild relative spinal canal narrowing at the remaining levels. Of note, a right center disc protrusion contacts the ventral spinal cord at C6-C7.   Additional sites of foraminal stenosis, as detailed and greatest on the left at C3-C4 (moderate) and on the right at C6-C7 (moderate).     Electronically Signed   By: Kellie Simmering D.O.   On: 04/15/2021 12:34  MRI C spine 11/26/21 IMPRESSION: 1. Overall no significant interval change since 04/15/2021. No abnormal enhancement. 2. Stable degenerative changes most advanced at C5-C6 where there is unchanged severe spinal canal stenosis and severe left and moderate right neural foraminal stenosis. No definite cord signal abnormality. 3. Unchanged moderate to severe left neural foraminal stenosis at C3-C4.     Electronically Signed   By: Valetta Mole M.D.   On: 11/26/2021 13:44  MRI T spine 11/13/21 IMPRESSION: 1. Prominent spondylitic spurring and ligamentum flavum thickening at T3-4 to  T9-10. Nodular thickening of the ligamentum flavum flattens the cord from behind at T3-4 and T9-10. No convincing cord signal abnormality. 2. Diffusely patent foramina.     Electronically Signed   By: Jorje Guild M.D.   On: 11/14/2021 05:58 I have personally reviewed the images and agree with the above interpretation.  Assessment and Plan: Bobby Chen is a pleasant 76 y.o. male with known cervical stenosis.   I do not feel that he is overtly myelopathic at this time.  I have recommended continue to watch his symptoms.  I will see him back in 3 months to monitor his symptomatology.  If he does not show signs of myelopathy at that time, I will discharge him from clinic with guidance regarding return to clinic.  I spent a total of 15 minutes in both face-to-face and non-face-to-face activities for this visit on the date of this  encounter.  This note was generated in part with voice recognition software and I apologize for any typographical errors that were not detected and corrected.  Bobby Maw MD Neurosurgery

## 2021-12-08 ENCOUNTER — Ambulatory Visit: Payer: Medicare Other

## 2021-12-08 DIAGNOSIS — M25511 Pain in right shoulder: Secondary | ICD-10-CM | POA: Diagnosis not present

## 2021-12-17 ENCOUNTER — Ambulatory Visit: Payer: Medicare Other

## 2021-12-17 DIAGNOSIS — M25511 Pain in right shoulder: Secondary | ICD-10-CM

## 2021-12-17 NOTE — Therapy (Signed)
OUTPATIENT PHYSICAL THERAPY SHOULDER TREATMENT    Patient Name: TOSHIO SLUSHER MRN: [id number] DOB:[birth year], 76 y.o., male Today's Date: 12/17/2021   PT End of Session - 12/17/21 0848     Visit Number 21    Number of Visits 33    Date for PT Re-Evaluation 01/04/22    Authorization Type eval: 6/31/49, recertification 10/24/61    PT Start Time 0847    PT Stop Time 0930    PT Time Calculation (min) 43 min    Activity Tolerance Patient tolerated treatment well    Behavior During Therapy [facility] for tasks assessed/performed              Past Medical History:  Diagnosis Date   Anginal pain ([place])    Brown recluse spider bite    Cancer ([place])    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, LEFT ARM    COPD (chronic obstructive pulmonary disease) ([place])    Diabetes mellitus without complication ([place])    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[place])    Elevated lipids    Hypertension    Obesity    Pulmonary arterial hypertension ([place])    Sleep apnea    Past Surgical History:  Procedure Laterality Date   ANKLE FRACTURE SURGERY     BASAL CELL CARCINOMA EXCISION N/A    scalp   COLONOSCOPY W/ POLYPECTOMY     COLONOSCOPY WITH PROPOFOL N/A 01/18/2016   Procedure: COLONOSCOPY WITH PROPOFOL;  Surgeon: Lollie Sails, MD;  Location: [facility] ENDOSCOPY;  Service: Endoscopy;  Laterality: N/A;   COLONOSCOPY WITH PROPOFOL N/A 07/02/2018   Procedure: COLONOSCOPY WITH PROPOFOL;  Surgeon: Lollie Sails, MD;  Location: [facility] ENDOSCOPY;  Service: Endoscopy;  Laterality: N/A;   ESOPHAGOGASTRODUODENOSCOPY (EGD) WITH PROPOFOL N/A 01/18/2016   Procedure: ESOPHAGOGASTRODUODENOSCOPY (EGD) WITH PROPOFOL;  Surgeon: Lollie Sails, MD;  Location: [facility] ENDOSCOPY;  Service: Endoscopy;  Laterality: N/A;   FRACTURE SURGERY Right 1984   ankle   HERNIA REPAIR     UMBILICAL   LEG SURGERY Left    NASAL SEPTOPLASTY W/ TURBINOPLASTY Bilateral 11/06/2019   Procedure: NASAL SEPTOPLASTY WITH INFERIOR TURBINATE REDUCTION;  Surgeon: Margaretha Sheffield, MD;   Location: [facility] ORS;  Service: ENT;  Laterality: Bilateral;   toenail removal Bilateral    Patient Active Problem List   Diagnosis Date Noted   Pain in joint, ankle and foot 10/19/2021   Lymphedema 10/19/2021   Spinal stenosis of cervical region 08/26/2021   Bilateral hearing loss 04/10/2020   Difficulty walking 04/10/2020   Sensory ataxia 04/10/2020   B12 deficiency 02/03/2020   Leg pain, right 08/26/2019   Chronic venous insufficiency 08/26/2019   Venous insufficiency 08/26/2019   PAD (peripheral artery disease) ([place]) 08/14/2019   PVD (peripheral vascular disease) ([place]) 08/14/2019   Obstructive sleep apnea 05/06/2019   Obesity, Class II, BMI 35-39.9 03/11/2019   Type 2 diabetes mellitus with stage 3a chronic kidney disease, without long-term current use of insulin ([place]) 03/11/2019   Pain in joint involving ankle and foot 11/12/2018   Nuclear sclerotic cataract of both eyes 08/30/2018   Abnormal sensation in left ear 08/20/2018   Dizziness 08/20/2018   Numbness and tingling of left side of face 08/20/2018   Essential hypertension with goal blood pressure less than 130/80 03/19/2018   Healthcare maintenance 08/07/2017   Brown recluse spider bite 05/08/2017   SCC (squamous cell carcinoma), 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/2015   Severe obesity (BMI 35.0-35.9 with comorbidity) ([place]) 06/01/2015   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[place]) 09/29/2013   Hyperlipidemia associated with type 2  diabetes mellitus ([place]) 09/29/2013    PCP: Kirk Ruths, MD  REFERRING PROVIDER: Diamond Nickel, DO  REFERRING DIAGNOSIS: M25.511 (ICD-10-CM) - Pain in right shoulder W19.XXXA (ICD-10-CM) - Unspecified fall, initial encounter [phone] (ICD-10-CM) - Other muscle spasm M75.41 (ICD-10-CM) - Impingement syndrome of right shoulder   THERAPY DIAG: Right shoulder pain, unspecified chronicity  RATIONALE FOR EVALUATION AND TREATMENT: Rehabilitation  ONSET DATE: 06/23/21 (approximate)  FOLLOW UP APPT WITH PROVIDER: Yes,  returns in June 2023 to see [name]   From initial evaluation  SUBJECTIVE:                                                                                                                                                                                         Chief Complaint: R shoulder pain  Pertinent History Pt referred to PT for R shoulder pain by orthopedics. His pain began after a fall at the end of March 2023. He was walking down steps and started leaning forward. He responded by leaning back and he fell backwards bracing himself with his arms. He experienced immediate R shoulder pain. He denies any additional falls in the last 6 months but he did have a fall about 1 year ago. He had an evaluation of his R shoulder pain at his PCP office. Plain films of his thoracic spine and ribs were obtained which did not show any rib fractures but multilevel disc changes in his thoracic spine. He reports that prior to his fall he was receiving physical therapy for his cervical stenosis with notable improvement in his pain and mobility. He was last evaluated by physiatry on 08/26/2021 for his neck pain that radiates into his right arm. This was suspected to be due to his nerve root impingement seen on recent cervical spine MRI. There is also question of carpal tunnel syndrome. He was recommended EMG of the bilateral upper extremities and epidural steroid injection to his cervical spine. Pt states that [facility] PT told him there were "unable to see him" for his R shoulder pain. Since his fall he has had more pain along his posterior right shoulder. The pain is "under my shoulder blade" and it radiates around his right trunk. He points to his lateral R ribs, scapula, and periscapular area mostly.  He denies associated swelling, shoulder locking/catching, shoulder instability, weakness, fevers or chills, night sweats, weight loss, skin color change. He has tried acetaminophen, [place] dream cream,  activity modification, chronic medication nortriptyline/pregabalin/tramadol with continued symptoms. He is right hand dominant and is retired from being a Art gallery manager. He also was previously in the [employer]   Pain:  Pain Intensity: Present: 0/10,  Best: 0/10, Worst: 7/10 Pain location: R thoracic paraspinal/periscapular region Pain Quality: dull  Radiating: Yes, around the R ribs  Numbness/Tingling: No, He reports questionable minor numbness around R ribs Focal Weakness: No, not related to current R shoulder pain Aggravating factors: leaning to the left, standing forward from a chair, bending forward "makes me feel drunk," reaching out with RUE; Relieving factors: Tylenol, topical analgesic provides temporary relief, no help with ice/heat,  24-hour pain behavior: "All day long" History of prior shoulder or neck/shoulder injury, pain, surgery, or therapy: Yes, see history Falls: Has patient fallen in last 6 months? Yes, Number of falls: 1 Dominant hand: right Imaging: Yes, Right Shoulder Radiographs - 3 views (Grashey, Axillary, Scapular Y) performed 08/31/2021: Type I acromion. Mild AC joint degenerative change with osteophyte formation. The distal clavicle, scapula, and proximal humerus are intact. Glenohumeral and acromioclavicular joint alignment appears normal. No fractures or dislocations. Well maintained glenohumeral joint space without evidence of degenerative joint disease. No soft tissue swelling or joint effusion. No destructive bony lesion is identified.  Cervical spine MRI without contrast performed 04/15/2021 IMPRESSION: Cervical spondylosis, as outlined and with findings most notably as follows: At C5-C6, a moderate-sized central disc protrusion contributes to multifactorial severe spinal canal stenosis with moderate spinal cord flattening. No definite spinal cord signal abnormality is identified, although motion degradation limits evaluation. Multifactorial bilateral neural foraminal  narrowing (moderate right, severe left). No more than mild relative spinal canal narrowing at the remaining levels. Of note, a right center disc protrusion contacts the ventral spinal cord at C6-C7. Additional sites of foraminal stenosis, as detailed and greatest on the left at C3-C4 (moderate) and on the right at C6-C7 (moderate).    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  Recent memory is intact.  Remote memory is intact.  Attention span and concentration are intact.  Expressive speech is intact.  Patient's fund of knowledge is within normal limits for educational level. Conversation is tangential with frequent diversions     Gross Musculoskeletal Assessment Tremor: None Bulk: Normal Tone: Normal Overweight  Gait Full gait assessment deferred  Posture Forward head/rounded shoulders in sitting and standing   Cervical Screen Spurlings A (ipsilateral lateral flexion/axial compression): R: Positive for reproduction of periscapular pain L: Positive Spurlings B (ipsilateral lateral flexion/contralateral rotation/axial compression): R: Positive for reproduction of periscapular pain L: Positive Repeated movement: Not performed Hoffman Sign (cervical cord compression): R: Negative L: Negative ULTT Median: R: Not examined L: Not examined ULTT Ulnar: R: Not examined L: Not examined ULTT Radial: R: Not examined L: Not examined   AROM  AROM  (Normal range in degrees) AROM 12/17/2021  Cervical  Flexion (50) 43  Extension (80) 23*  Right lateral flexion (45) 35*  Left lateral flexion (45) 35  Right rotation (85)   Left rotation (85)    Right Left  Shoulder    Flexion    Extension    Abduction    External Rotation    Internal Rotation  Hands Behind Head    Hands Behind Back        Elbow    Flexion    Extension    Pronation    Supination    (* = pain; Blank rows = not tested)    LE MMT:  MMT (out of 5) Right 12/17/2021 Left 12/17/2021  Cervical (isometric)  Flexion Weak  Extension Weak  Lateral Flexion Weak Weak  Rotation Weak Weak      Shoulder   Flexion 4 4  Extension    Abduction 4+ 4  External rotation 3+* 3+  Internal rotation 5 5  Horizontal abduction    Horizontal adduction    Lower Trapezius    Rhomboids        Elbow  Flexion 5 5  Extension 5 5  Pronation    Supination        Wrist  Flexion 5 5  Extension 5 5  Radial deviation    Ulnar deviation        MCP  Flexion 5 5  Extension 5 5  Abduction    Adduction    (* = pain; Blank rows = not tested)  Positive reproduction of concordant pain with resisted L shoulder ER  Sensation Deferred  Reflexes Deferred   Palpation  Location LEFT  RIGHT           Subocciptials  0  Cervical paraspinals  2  Upper Trapezius    Levator Scapulae    Rhomboid Major/Minor  2  Sternoclavicular joint    Acromioclavicular joint    Coracoid process    Long head of biceps  2  Supraspinatus    Infraspinatus    Subscapularis    Teres Minor    Teres Major    Pectoralis Major    Pectoralis Minor    Anterior Deltoid  1  Lateral Deltoid    Posterior Deltoid    Latissimus Dorsi    Sternocleidomastoid    (Blank rows = not tested) Graded on 0-4 scale (0 = no pain, 1 = pain, 2 = pain with wincing/grimacing/flinching, 3 = pain with withdrawal, 4 = unwilling to allow palpation), (Blank rows = not tested)   Repeated  Movements Deferred  Passive Accessory Intervertebral Motion Deferred   Accessory Motions/Glides Deferred   SPECIAL TESTS Rotator Cuff  Drop Arm Test: Negative Painful Arc (Pain from 60 to 120 degrees scaption): Negative Infraspinatus Muscle Test: Positive   Subacromial Impingement Deferred  Labral Tear Deferred  Bicep Tendon Pathology Deferred  Shoulder Instability Deferred   Beighton scale: Deferred  FROM 09/23/21 VISIT:  AROM  AROM (Normal range in degrees) AROM 09/23/2021  Cervical  Flexion (50) 43  Extension (80) 23*  Right lateral flexion (45) 35*  Left lateral flexion (45) 35  Right rotation (85)   Left rotation (85)    Right Left  Shoulder    Flexion 160* 160*  Extension    Abduction 145* 120*  External Rotation 60* 55*  Internal Rotation 86 85  Hands Behind Head    Hands Behind Back        Elbow    Flexion WNL   Extension WNL   Pronation    Supination    (* = pain; Blank rows = not tested)  Passive Accessory Intervertebral Motion Pt denies reproduction of shoulder pain with CPA C2-T7 and R UPA C2-T7 however he is tender centrally to pressure. Generally, hypomobile throughout however difficult to assess mobility secondary to pain;  Accessory Motions/Glides  Glenohumeral: Posterior: R: Painful, unable to fully assess L: not examined Inferior: R: abnormal, hypomobile and painful L: not examined Anterior: R: normal L: not examined  Acromioclavicular:  Posterior: R: Too painful to assess fully  L: not examined Anterior: R: Too painful to assess fully L: not examined  Sternoclavicular: Posterior: R: Too painful to assess fully L: not examined Anterior: R: Too painful to assess fully L: not examined Superior: R: Too painful to assess fully L: not examined Inferior: R: Too painful to assess fully L: not examined  Scapulothoracic: Painful in all directions so unable to fully assess mobility;   Muscle Length Testing Deferred   SPECIAL  TESTS (all on right side)  Subacromial Impingement Hawkins-Kennedy: Positive Neer (Block scapula, PROM flexion): Positive Painful Arc (Pain from 60 to 120 degrees scaption): Negative Empty Can: Positive External Rotation Resistance: Positive Horizontal Adduction: Negative Scapular Assist: Not examined  Labral Tear Biceps Load II (120 elevation, full ER, 90 elbow flexion, full supination, resisted elbow flexion): Negative Crank (160 scaption, axial load with IR/ER): Positive Active Compression Test: Negative  Bicep Tendon Pathology Speed (shoulder flexion to 90, external rotation, full elbow extension, and forearm supination with resistance: Negative Yergason's (resisted shoulder ER and supination/biceps tendon pathology): Not examined  Shoulder Instability Sulcus Sign: Positive Anterior Apprehension: Negative    TODAY'S TREATMENT   SUBJECTIVE: Pt reports that he is doing alright today. Reports 4/10 R shoulder pain upon arrival today. No further episodes of vertigo but felt like it "wanted to start" this morning. States his BP has been elevated recently and he has been in contact with his PCP. No specific questions or concerns at start of session   PAIN: 4/10 R shoulder blade pain;   Ther-ex  Pre-exercise vitals: BP: 161/90, HR: 86, SpO2: 96%  UBE x 5 minutes (2.5 forward/2.5 backwards) for warm-up, unbilled; Supine R shoulder flexion with 6# dumbbell (DB) 3 x 15; Supine R shoulder circles (CW and CCW) with 6# DB 3 x 15 each direction; Supine R shoulder serratus punch with 6# DB 3 x 15; Attempted L sidelying R shoulder ER with multiple weights but discontinued secondary to pain; L sidelying R shoulder abduction with 6# DB 3 x 15; Prone R shoulder extension with 3# DB 2 x 15; Prone R shoulder row with 3# DB 2 x 15; Prone R shoulder horizontal abduction with 3# DB 2 x 15; Prone R shoulder low trap flexion with 3# DB 2 x 15;   Not performed today: Supine R elbow flexion  with 6# DB 2 x 15; Seated shoulder flexion with 3# DB 3 x 15 BUE; Seated shoulder scaption with 3# DB 3 x 15 BUE; Seated overhead shoulder press with 3# DB x 10, discontinued secondary to increase in flank pain; Seated "W's" with green tband 3 x 10; Seated Nautilus rows with 60# 2 x 20; Seated Nautilus lat pull down 110# x 10, decreased weight to 95# due to increase in pain 2 x 10; Seated scapular retractions 2 x 10 with tactile cues for proper technique; Standing Nautilus chest press 50# x 10, 60# 2 x 10; Standing Nautilus shoulder extension 40# 3 x 10;   PATIENT EDUCATION:  Education details: Pt educated throughout session about proper posture and technique with exercises. Improved exercise technique, movement at target joints, use of target muscles after min to mod verbal, visual, tactile cues. Person educated: Patient Education method: Explanation, verbal cues, tactile cues, and handout Education comprehension: verbalized understanding,    HOME EXERCISE  PROGRAM: Access Code: [email or web address] URL: [email or web address] Date: 09/23/2021 Prepared by: Roxana Hires  Exercises - Seated Scapular Retraction  - 1 x daily - 7 x weekly - 2 sets - 10 reps - 2s hold - Standing Shoulder Row with Anchored Resistance  - 1 x daily - 7 x weekly - 2 sets - 10 reps - 2s hold - Shoulder W - External Rotation with Resistance  - 1 x daily - 7 x weekly - 2 sets - 10 reps - 2s hold   ASSESSMENT:  CLINICAL IMPRESSION: Continued with pain-free strengthening for R shoulder today. The only exercise that has to be discontinued secondary to pain is sidelying R shoulder external rotation. Progressed prone strengthening during session and pt is able to complete with appropriate muscle fatigue. Pt encouraged to continue HEP and follow-up as scheduled. He will continue to benefit from PT services to address deficits in strength, pain, and range of motion in order to return to full function at home.     REHAB POTENTIAL: Fair secondary to multiple comorbidities and ongoing chronic neck pain  CLINICAL DECISION MAKING: Unstable/unpredictable  EVALUATION COMPLEXITY: High   GOALS: Goals reviewed with patient? Yes  SHORT TERM GOALS: Target date: 12/07/2021   Pt will be independent with HEP to improve strength and decrease shoulder pain to improve pain-free function at home and work. Baseline:  Goal status: ONGOING   LONG TERM GOALS: Target date: 01/04/2022   Pt will increase FOTO to at least 62 to demonstrate significant improvement in function at home and work related to neck pain  Baseline: 09/15/21: 50; 10/21/21: 53; 12/08/21: 72 Goal status: ACHIEVED  2.  Pt will decrease worst shoulder pain by at least 3 points on the NPRS in order to demonstrate clinically significant reduction in shoulder pain. Baseline: 09/15/21: worst: 7/10; 10/21/21: worst: 7/10, 12/08/21: 4/10; Goal status: ACHIEVED  3.  Pt will decrease QuickDASH score by at least 8% in order to demonstrate clinically significant reduction in disability related to shoulder pain        Baseline: 09/15/21: 37.5%, 10/21/21: 45.5%; 12/08/21: 63.6% Goal status: ONGOING  4. Pt will increase strength of pain-free R shoulder external rotation to at least 4/5 in order to demonstrate improvement in strength and function of RUE       Baseline: 09/15/21: 3+/5 painful, 10/21/21: 4+/5 with minimal pain, 12/08/21: 4+/5 with pain; Goal status: PARTIALLY MET   PLAN: PT FREQUENCY: 1-2x/week  PT DURATION: 8 weeks  PLANNED INTERVENTIONS: Therapeutic exercises, Therapeutic activity, Neuromuscular re-education, Balance training, Gait training, Patient/Family education, Joint manipulation, Joint mobilization, Vestibular training, Canalith repositioning, Aquatic Therapy, Dry Needling, Electrical stimulation, Spinal manipulation, Spinal mobilization, Cryotherapy, Moist heat, Traction, Ultrasound, Ionotophoresis 4mg /ml Dexamethasone, and Manual  therapy  PLAN FOR NEXT SESSION:  continue strengthening , review and modify HEP as needed  Lyndel Safe Charise Leinbach PT, DPT, GCS  Jamarcus Laduke 12/17/2021, 9:39 AM

## 2021-12-21 NOTE — Therapy (Signed)
OUTPATIENT PHYSICAL THERAPY SHOULDER TREATMENT    Patient Name: Bobby Chen MRN: [id number] DOB:[birth year], 76 y.o., male Today's Date: 12/24/2021   PT End of Session - 12/24/21 0856     Visit Number 22    Number of Visits 33    Date for PT Re-Evaluation 01/04/22    Authorization Type eval: 0/24/09, recertification 7/35/32    PT Start Time 0855    PT Stop Time 0930    PT Time Calculation (min) 35 min    Activity Tolerance Patient tolerated treatment well    Behavior During Therapy [facility] for tasks assessed/performed             Past Medical History:  Diagnosis Date   Anginal pain ([place])    Brown recluse spider bite    Cancer ([place])    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, LEFT ARM    COPD (chronic obstructive pulmonary disease) ([place])    Diabetes mellitus without complication ([place])    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[place])    Elevated lipids    Hypertension    Obesity    Pulmonary arterial hypertension ([place])    Sleep apnea    Past Surgical History:  Procedure Laterality Date   ANKLE FRACTURE SURGERY     BASAL CELL CARCINOMA EXCISION N/A    scalp   COLONOSCOPY W/ POLYPECTOMY     COLONOSCOPY WITH PROPOFOL N/A 01/18/2016   Procedure: COLONOSCOPY WITH PROPOFOL;  Surgeon: Lollie Sails, MD;  Location: [facility] ENDOSCOPY;  Service: Endoscopy;  Laterality: N/A;   COLONOSCOPY WITH PROPOFOL N/A 07/02/2018   Procedure: COLONOSCOPY WITH PROPOFOL;  Surgeon: Lollie Sails, MD;  Location: [facility] ENDOSCOPY;  Service: Endoscopy;  Laterality: N/A;   ESOPHAGOGASTRODUODENOSCOPY (EGD) WITH PROPOFOL N/A 01/18/2016   Procedure: ESOPHAGOGASTRODUODENOSCOPY (EGD) WITH PROPOFOL;  Surgeon: Lollie Sails, MD;  Location: [facility] ENDOSCOPY;  Service: Endoscopy;  Laterality: N/A;   FRACTURE SURGERY Right 1984   ankle   HERNIA REPAIR     UMBILICAL   LEG SURGERY Left    NASAL SEPTOPLASTY W/ TURBINOPLASTY Bilateral 11/06/2019   Procedure: NASAL SEPTOPLASTY WITH INFERIOR TURBINATE REDUCTION;  Surgeon: Margaretha Sheffield, MD;  Location:  [facility] ORS;  Service: ENT;  Laterality: Bilateral;   toenail removal Bilateral    Patient Active Problem List   Diagnosis Date Noted   Pain in joint, ankle and foot 10/19/2021   Lymphedema 10/19/2021   Spinal stenosis of cervical region 08/26/2021   Bilateral hearing loss 04/10/2020   Difficulty walking 04/10/2020   Sensory ataxia 04/10/2020   B12 deficiency 02/03/2020   Leg pain, right 08/26/2019   Chronic venous insufficiency 08/26/2019   Venous insufficiency 08/26/2019   PAD (peripheral artery disease) ([place]) 08/14/2019   PVD (peripheral vascular disease) ([place]) 08/14/2019   Obstructive sleep apnea 05/06/2019   Obesity, Class II, BMI 35-39.9 03/11/2019   Type 2 diabetes mellitus with stage 3a chronic kidney disease, without long-term current use of insulin ([place]) 03/11/2019   Pain in joint involving ankle and foot 11/12/2018   Nuclear sclerotic cataract of both eyes 08/30/2018   Abnormal sensation in left ear 08/20/2018   Dizziness 08/20/2018   Numbness and tingling of left side of face 08/20/2018   Essential hypertension with goal blood pressure less than 130/80 03/19/2018   Healthcare maintenance 08/07/2017   Brown recluse spider bite 05/08/2017   SCC (squamous cell carcinoma), 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/2015   Severe obesity (BMI 35.0-35.9 with comorbidity) ([place]) 06/01/2015   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[place]) 09/29/2013   Hyperlipidemia associated with type 2 diabetes  mellitus ([place]) 09/29/2013    PCP: Kirk Ruths, MD  REFERRING PROVIDER: Kirk Ruths, MD  REFERRING DIAGNOSIS: M25.511 (ICD-10-CM) - Pain in right shoulder W19.XXXA (ICD-10-CM) - Unspecified fall, initial encounter [phone] (ICD-10-CM) - Other muscle spasm M75.41 (ICD-10-CM) - Impingement syndrome of right shoulder   THERAPY DIAG: Right shoulder pain, unspecified chronicity  RATIONALE FOR EVALUATION AND TREATMENT: Rehabilitation  ONSET DATE: 06/23/21 (approximate)  FOLLOW UP APPT WITH PROVIDER: Yes, returns in  June 2023 to see [name]   From initial evaluation  SUBJECTIVE:                                                                                                                                                                                         Chief Complaint: R shoulder pain  Pertinent History Pt referred to PT for R shoulder pain by orthopedics. His pain began after a fall at the end of March 2023. He was walking down steps and started leaning forward. He responded by leaning back and he fell backwards bracing himself with his arms. He experienced immediate R shoulder pain. He denies any additional falls in the last 6 months but he did have a fall about 1 year ago. He had an evaluation of his R shoulder pain at his PCP office. Plain films of his thoracic spine and ribs were obtained which did not show any rib fractures but multilevel disc changes in his thoracic spine. He reports that prior to his fall he was receiving physical therapy for his cervical stenosis with notable improvement in his pain and mobility. He was last evaluated by physiatry on 08/26/2021 for his neck pain that radiates into his right arm. This was suspected to be due to his nerve root impingement seen on recent cervical spine MRI. There is also question of carpal tunnel syndrome. He was recommended EMG of the bilateral upper extremities and epidural steroid injection to his cervical spine. Pt states that [facility] PT told him there were "unable to see him" for his R shoulder pain. Since his fall he has had more pain along his posterior right shoulder. The pain is "under my shoulder blade" and it radiates around his right trunk. He points to his lateral R ribs, scapula, and periscapular area mostly.  He denies associated swelling, shoulder locking/catching, shoulder instability, weakness, fevers or chills, night sweats, weight loss, skin color change. He has tried acetaminophen, [place] dream cream, activity  modification, chronic medication nortriptyline/pregabalin/tramadol with continued symptoms. He is right hand dominant and is retired from being a Art gallery manager. He also was previously in the [employer]   Pain:  Pain Intensity: Present: 0/10, Best:  0/10, Worst: 7/10 Pain location: R thoracic paraspinal/periscapular region Pain Quality: dull  Radiating: Yes, around the R ribs  Numbness/Tingling: No, He reports questionable minor numbness around R ribs Focal Weakness: No, not related to current R shoulder pain Aggravating factors: leaning to the left, standing forward from a chair, bending forward "makes me feel drunk," reaching out with RUE; Relieving factors: Tylenol, topical analgesic provides temporary relief, no help with ice/heat,  24-hour pain behavior: "All day long" History of prior shoulder or neck/shoulder injury, pain, surgery, or therapy: Yes, see history Falls: Has patient fallen in last 6 months? Yes, Number of falls: 1 Dominant hand: right Imaging: Yes, Right Shoulder Radiographs - 3 views (Grashey, Axillary, Scapular Y) performed 08/31/2021: Type I acromion. Mild AC joint degenerative change with osteophyte formation. The distal clavicle, scapula, and proximal humerus are intact. Glenohumeral and acromioclavicular joint alignment appears normal. No fractures or dislocations. Well maintained glenohumeral joint space without evidence of degenerative joint disease. No soft tissue swelling or joint effusion. No destructive bony lesion is identified.  Cervical spine MRI without contrast performed 04/15/2021 IMPRESSION: Cervical spondylosis, as outlined and with findings most notably as follows: At C5-C6, a moderate-sized central disc protrusion contributes to multifactorial severe spinal canal stenosis with moderate spinal cord flattening. No definite spinal cord signal abnormality is identified, although motion degradation limits evaluation. Multifactorial bilateral neural foraminal narrowing  (moderate right, severe left). No more than mild relative spinal canal narrowing at the remaining levels. Of note, a right center disc protrusion contacts the ventral spinal cord at C6-C7. Additional sites of foraminal stenosis, as detailed and greatest on the left at C3-C4 (moderate) and on the right at C6-C7 (moderate).    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  Recent memory is intact.  Remote memory is intact.  Attention span and concentration are intact.  Expressive speech is intact.  Patient's fund of knowledge is within normal limits for educational level. Conversation is tangential with frequent diversions     Gross Musculoskeletal Assessment Tremor: None Bulk: Normal Tone: Normal Overweight  Gait Full gait assessment deferred  Posture Forward head/rounded shoulders in sitting and standing   Cervical Screen Spurlings A (ipsilateral lateral flexion/axial compression): R: Positive for reproduction of periscapular pain L: Positive Spurlings B (ipsilateral lateral flexion/contralateral rotation/axial compression): R: Positive for reproduction of periscapular pain L: Positive Repeated movement: Not performed Hoffman Sign (cervical cord compression): R: Negative L: Negative ULTT Median: R: Not examined L: Not examined ULTT Ulnar: R: Not examined L: Not examined ULTT Radial: R: Not examined L: Not examined   AROM  AROM (Normal  range in degrees) AROM 12/24/2021  Cervical  Flexion (50) 43  Extension (80) 23*  Right lateral flexion (45) 35*  Left lateral flexion (45) 35  Right rotation (85)   Left rotation (85)    Right Left  Shoulder    Flexion    Extension    Abduction    External Rotation    Internal Rotation  Hands Behind Head    Hands Behind Back        Elbow    Flexion    Extension    Pronation    Supination    (* = pain; Blank rows = not tested)  LE MMT:  MMT (out of 5) Right 12/24/2021 Left 12/24/2021  Cervical (isometric)  Flexion Weak  Extension Weak  Lateral Flexion Weak Weak  Rotation Weak Weak      Shoulder   Flexion 4 4  Extension    Abduction 4+ 4  External rotation 3+* 3+  Internal rotation 5 5  Horizontal abduction    Horizontal adduction    Lower Trapezius    Rhomboids        Elbow  Flexion 5 5  Extension 5 5  Pronation    Supination        Wrist  Flexion 5 5  Extension 5 5  Radial deviation    Ulnar deviation        MCP  Flexion 5 5  Extension 5 5  Abduction    Adduction    (* = pain; Blank rows = not tested)  Positive reproduction of concordant pain with resisted L shoulder ER  Sensation Deferred  Reflexes Deferred   Palpation  Location LEFT  RIGHT           Subocciptials  0  Cervical paraspinals  2  Upper Trapezius    Levator Scapulae    Rhomboid Major/Minor  2  Sternoclavicular joint    Acromioclavicular joint    Coracoid process    Long head of biceps  2  Supraspinatus    Infraspinatus    Subscapularis    Teres Minor    Teres Major    Pectoralis Major    Pectoralis Minor    Anterior Deltoid  1  Lateral Deltoid    Posterior Deltoid    Latissimus Dorsi    Sternocleidomastoid    (Blank rows = not tested) Graded on 0-4 scale (0 = no pain, 1 = pain, 2 = pain with wincing/grimacing/flinching, 3 = pain with withdrawal, 4 = unwilling to allow palpation), (Blank rows = not tested)   Repeated Movements Deferred  Passive  Accessory Intervertebral Motion Deferred   Accessory Motions/Glides Deferred   SPECIAL TESTS Rotator Cuff  Drop Arm Test: Negative Painful Arc (Pain from 60 to 120 degrees scaption): Negative Infraspinatus Muscle Test: Positive   Subacromial Impingement Deferred  Labral Tear Deferred  Bicep Tendon Pathology Deferred  Shoulder Instability Deferred   Beighton scale: Deferred  FROM 09/23/21 VISIT:  AROM  AROM (Normal range in degrees) AROM 09/23/2021  Cervical  Flexion (50) 43  Extension (80) 23*  Right lateral flexion (45) 35*  Left lateral flexion (45) 35  Right rotation (85)   Left rotation (85)    Right Left  Shoulder    Flexion 160* 160*  Extension    Abduction 145* 120*  External Rotation 60* 55*  Internal Rotation 86 85  Hands Behind Head    Hands Behind Back        Elbow    Flexion WNL   Extension WNL   Pronation    Supination    (* = pain; Blank rows = not tested)  Passive Accessory Intervertebral Motion Pt denies reproduction of shoulder pain with CPA C2-T7 and R UPA C2-T7 however he is tender centrally to pressure. Generally, hypomobile throughout however difficult to assess mobility secondary to pain;  Accessory Motions/Glides Glenohumeral: Posterior:  R: Painful, unable to fully assess L: not examined Inferior: R: abnormal, hypomobile and painful L: not examined Anterior: R: normal L: not examined  Acromioclavicular:  Posterior: R: Too painful to assess fully  L: not examined Anterior: R: Too painful to assess fully L: not examined  Sternoclavicular: Posterior: R: Too painful to assess fully L: not examined Anterior: R: Too painful to assess fully L: not examined Superior: R: Too painful to assess fully L: not examined Inferior: R: Too painful to assess fully L: not examined  Scapulothoracic: Painful in all directions so unable to fully assess mobility;   Muscle Length Testing Deferred   SPECIAL TESTS (all on right  side)  Subacromial Impingement Hawkins-Kennedy: Positive Neer (Block scapula, PROM flexion): Positive Painful Arc (Pain from 60 to 120 degrees scaption): Negative Empty Can: Positive External Rotation Resistance: Positive Horizontal Adduction: Negative Scapular Assist: Not examined  Labral Tear Biceps Load II (120 elevation, full ER, 90 elbow flexion, full supination, resisted elbow flexion): Negative Crank (160 scaption, axial load with IR/ER): Positive Active Compression Test: Negative  Bicep Tendon Pathology Speed (shoulder flexion to 90, external rotation, full elbow extension, and forearm supination with resistance: Negative Yergason's (resisted shoulder ER and supination/biceps tendon pathology): Not examined  Shoulder Instability Sulcus Sign: Positive Anterior Apprehension: Negative    TODAY'S TREATMENT   SUBJECTIVE: Pt reports that he is doing alright today. Denies pain upon arrival today. No further episodes of vertigo. No specific questions or concerns at start of session   PAIN: Denies   Ther-ex  UBE x 5 minutes (2.5 forward/2.5 backwards) for warm-up, unbilled; Seated Nautilus lat pull down 110# x 10, decreased weight to 95# due to increase in pain 2 x 10; Seated Nautilus rows with 60# x 25, 70# 2 x 20; Seated "W's" with green tband  3 x 20; Seated shoulder flexion with 3# DB 2 x 20 BUE; Seated shoulder scaption with 3# DB  x 20, x 15 BUE, set set discontinued secondary to increase in R shoulder pain;   Not performed today: Supine R elbow flexion with 6# DB 2 x 15; Standing Nautilus chest press 50# x 10, 60# 2 x 10; Standing Nautilus shoulder extension 40# 3 x 10; Supine R shoulder flexion with 6# dumbbell (DB) 3 x 15; Supine R shoulder circles (CW and CCW) with 6# DB 3 x 15 each direction; Supine R shoulder serratus punch with 6# DB 3 x 15; Attempted L sidelying R shoulder ER with multiple weights but discontinued secondary to pain; L sidelying R  shoulder abduction with 6# DB 3 x 15; Prone R shoulder extension with 3# DB 2 x 15; Prone R shoulder row with 3# DB 2 x 15; Prone R shoulder horizontal abduction with 3# DB 2 x 15; Prone R shoulder low trap flexion with 3# DB 2 x 15; Seated overhead shoulder press with 3# DB x 10, discontinued secondary to increase in flank pain; Seated scapular retractions 2 x 10 with tactile cues for proper technique;    PATIENT EDUCATION:  Education details: Pt educated throughout session about proper posture and technique with exercises. Improved exercise technique, movement at target joints, use of target muscles after min to mod verbal, visual, tactile cues. Person educated: Patient Education method: Explanation, verbal cues, tactile cues, and handout Education comprehension: verbalized understanding,    HOME EXERCISE PROGRAM: Access Code: [email or web address] URL: [email or web address] Date: 09/23/2021 Prepared by: Roxana Hires  Exercises - Seated Scapular Retraction  - 1 x daily - 7 x  weekly - 2 sets - 10 reps - 2s hold - Standing Shoulder Row with Anchored Resistance  - 1 x daily - 7 x weekly - 2 sets - 10 reps - 2s hold - Shoulder W - External Rotation with Resistance  - 1 x daily - 7 x weekly - 2 sets - 10 reps - 2s hold   ASSESSMENT:  CLINICAL IMPRESSION: Pt arrived late so session was abbreviated accordingly. Continued with pain-free strengthening for R shoulder today. Utilized Surveyor, minerals for progressive loading. Pt encouraged to continue HEP and follow-up as scheduled. He will continue to benefit from PT services to address deficits in strength, pain, and range of motion in order to return to full function at home.    REHAB POTENTIAL: Fair secondary to multiple comorbidities and ongoing chronic neck pain  CLINICAL DECISION MAKING: Unstable/unpredictable  EVALUATION COMPLEXITY: High   GOALS: Goals reviewed with patient? Yes  SHORT TERM GOALS: Target date: 12/07/2021    Pt will be independent with HEP to improve strength and decrease shoulder pain to improve pain-free function at home and work. Baseline:  Goal status: ONGOING   LONG TERM GOALS: Target date: 01/04/2022   Pt will increase FOTO to at least 62 to demonstrate significant improvement in function at home and work related to neck pain  Baseline: 09/15/21: 50; 10/21/21: 53; 12/08/21: 72 Goal status: ACHIEVED  2.  Pt will decrease worst shoulder pain by at least 3 points on the NPRS in order to demonstrate clinically significant reduction in shoulder pain. Baseline: 09/15/21: worst: 7/10; 10/21/21: worst: 7/10, 12/08/21: 4/10; Goal status: ACHIEVED  3.  Pt will decrease QuickDASH score by at least 8% in order to demonstrate clinically significant reduction in disability related to shoulder pain        Baseline: 09/15/21: 37.5%, 10/21/21: 45.5%; 12/08/21: 63.6% Goal status: ONGOING  4. Pt will increase strength of pain-free R shoulder external rotation to at least 4/5 in order to demonstrate improvement in strength and function of RUE       Baseline: 09/15/21: 3+/5 painful, 10/21/21: 4+/5 with minimal pain, 12/08/21: 4+/5 with pain; Goal status: PARTIALLY MET   PLAN: PT FREQUENCY: 1-2x/week  PT DURATION: 8 weeks  PLANNED INTERVENTIONS: Therapeutic exercises, Therapeutic activity, Neuromuscular re-education, Balance training, Gait training, Patient/Family education, Joint manipulation, Joint mobilization, Vestibular training, Canalith repositioning, Aquatic Therapy, Dry Needling, Electrical stimulation, Spinal manipulation, Spinal mobilization, Cryotherapy, Moist heat, Traction, Ultrasound, Ionotophoresis 28m/ml Dexamethasone, and Manual therapy  PLAN FOR NEXT SESSION:  continue strengthening , review and modify HEP as needed  JLyndel SafeHuprich PT, DPT, GCS  Mukhtar Shams 12/24/2021, 9:31 AM

## 2021-12-24 ENCOUNTER — Ambulatory Visit: Payer: Medicare Other | Attending: Sports Medicine

## 2021-12-24 DIAGNOSIS — R42 Dizziness and giddiness: Secondary | ICD-10-CM | POA: Diagnosis present

## 2021-12-24 DIAGNOSIS — M25511 Pain in right shoulder: Secondary | ICD-10-CM | POA: Insufficient documentation

## 2021-12-30 ENCOUNTER — Ambulatory Visit: Payer: Medicare Other

## 2021-12-30 DIAGNOSIS — R42 Dizziness and giddiness: Secondary | ICD-10-CM

## 2021-12-30 DIAGNOSIS — M25511 Pain in right shoulder: Secondary | ICD-10-CM | POA: Diagnosis not present

## 2021-12-30 NOTE — Therapy (Signed)
OUTPATIENT PHYSICAL THERAPY SHOULDER TREATMENT    Patient Name: Bobby Chen MRN: [id number] DOB:24-Aug-1945, 76 y.o., male Today's Date: 12/30/2021   PT End of Session - 12/30/21 1456     Visit Number 23    Number of Visits 33    Date for PT Re-Evaluation 01/04/22    Authorization Type eval: 2/50/03, recertification 10/27/86    PT Start Time 1455    PT Stop Time 1530    PT Time Calculation (min) 35 min    Activity Tolerance Patient tolerated treatment well    Behavior During Therapy [facility] for tasks assessed/performed             Past Medical History:  Diagnosis Date   Anginal pain ([place])    Brown recluse spider bite    Cancer ([place])    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, LEFT ARM    COPD (chronic obstructive pulmonary disease) ([place])    Diabetes mellitus without complication ([place])    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[place])    Elevated lipids    Hypertension    Obesity    Pulmonary arterial hypertension ([place])    Sleep apnea    Past Surgical History:  Procedure Laterality Date   ANKLE FRACTURE SURGERY     BASAL CELL CARCINOMA EXCISION N/A    scalp   COLONOSCOPY W/ POLYPECTOMY     COLONOSCOPY WITH PROPOFOL N/A 01/18/2016   Procedure: COLONOSCOPY WITH PROPOFOL;  Surgeon: Lollie Sails, MD;  Location: [facility] ENDOSCOPY;  Service: Endoscopy;  Laterality: N/A;   COLONOSCOPY WITH PROPOFOL N/A 07/02/2018   Procedure: COLONOSCOPY WITH PROPOFOL;  Surgeon: Lollie Sails, MD;  Location: [facility] ENDOSCOPY;  Service: Endoscopy;  Laterality: N/A;   ESOPHAGOGASTRODUODENOSCOPY (EGD) WITH PROPOFOL N/A 01/18/2016   Procedure: ESOPHAGOGASTRODUODENOSCOPY (EGD) WITH PROPOFOL;  Surgeon: Lollie Sails, MD;  Location: [facility] ENDOSCOPY;  Service: Endoscopy;  Laterality: N/A;   FRACTURE SURGERY Right 1984   ankle   HERNIA REPAIR     UMBILICAL   LEG SURGERY Left    NASAL SEPTOPLASTY W/ TURBINOPLASTY Bilateral 11/06/2019   Procedure: NASAL SEPTOPLASTY WITH INFERIOR TURBINATE REDUCTION;  Surgeon: Margaretha [place], MD;  Location:  [facility] ORS;  Service: ENT;  Laterality: Bilateral;   toenail removal Bilateral    Patient Active Problem List   Diagnosis Date Noted   Pain in joint, ankle and foot 10/19/2021   Lymphedema 10/19/2021   Spinal stenosis of cervical region 08/26/2021   Bilateral hearing loss 04/10/2020   Difficulty walking 04/10/2020   Sensory ataxia 04/10/2020   B12 deficiency 02/03/2020   Leg pain, right 08/26/2019   Chronic venous insufficiency 08/26/2019   Venous insufficiency 08/26/2019   PAD (peripheral artery disease) ([place]) 08/14/2019   PVD (peripheral vascular disease) ([place]) 08/14/2019   Obstructive sleep apnea 05/06/2019   Obesity, Class II, BMI 35-39.9 03/11/2019   Type 2 diabetes mellitus with stage 3a chronic kidney disease, without long-term current use of insulin ([place]) 03/11/2019   Pain in joint involving ankle and foot 11/12/2018   Nuclear sclerotic cataract of both eyes 08/30/2018   Abnormal sensation in left ear 08/20/2018   Dizziness 08/20/2018   Numbness and tingling of left side of face 08/20/2018   Essential hypertension with goal blood pressure less than 130/80 03/19/2018   Healthcare maintenance 08/07/2017   Brown recluse spider bite 05/08/2017   SCC (squamous cell carcinoma), 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/2015   Severe obesity (BMI 35.0-35.9 with comorbidity) ([place]) 06/01/2015   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[place]) 09/29/2013   Hyperlipidemia associated with type 2 diabetes  mellitus ([place]) 09/29/2013    PCP: Kirk Ruths, MD  REFERRING PROVIDER: Diamond Nickel, DO  REFERRING DIAGNOSIS: M25.511 (ICD-10-CM) - Pain in right shoulder W19.XXXA (ICD-10-CM) - Unspecified fall, initial encounter [phone] (ICD-10-CM) - Other muscle spasm M75.41 (ICD-10-CM) - Impingement syndrome of right shoulder   THERAPY DIAG: Right shoulder pain, unspecified chronicity  Dizziness and giddiness  RATIONALE FOR EVALUATION AND TREATMENT: Rehabilitation  ONSET DATE: 06/23/21 (approximate)  FOLLOW UP APPT WITH  PROVIDER: Yes, returns in June 2023 to see [name]   From initial evaluation  SUBJECTIVE:                                                                                                                                                                                         Chief Complaint: R shoulder pain  Pertinent History Pt referred to PT for R shoulder pain by orthopedics. His pain began after a fall at the end of March 2023. He was walking down steps and started leaning forward. He responded by leaning back and he fell backwards bracing himself with his arms. He experienced immediate R shoulder pain. He denies any additional falls in the last 6 months but he did have a fall about 1 year ago. He had an evaluation of his R shoulder pain at his PCP office. Plain films of his thoracic spine and ribs were obtained which did not show any rib fractures but multilevel disc changes in his thoracic spine. He reports that prior to his fall he was receiving physical therapy for his cervical stenosis with notable improvement in his pain and mobility. He was last evaluated by physiatry on 08/26/2021 for his neck pain that radiates into his right arm. This was suspected to be due to his nerve root impingement seen on recent cervical spine MRI. There is also question of carpal tunnel syndrome. He was recommended EMG of the bilateral upper extremities and epidural steroid injection to his cervical spine. Pt states that [facility] PT told him there were "unable to see him" for his R shoulder pain. Since his fall he has had more pain along his posterior right shoulder. The pain is "under my shoulder blade" and it radiates around his right trunk. He points to his lateral R ribs, scapula, and periscapular area mostly.  He denies associated swelling, shoulder locking/catching, shoulder instability, weakness, fevers or chills, night sweats, weight loss, skin color change. He has tried acetaminophen, [place] dream  cream, activity modification, chronic medication nortriptyline/pregabalin/tramadol with continued symptoms. He is right hand dominant and is retired from being a Art gallery manager. He also was previously in the [employer]   Pain:  Pain  Intensity: Present: 0/10, Best: 0/10, Worst: 7/10 Pain location: R thoracic paraspinal/periscapular region Pain Quality: dull  Radiating: Yes, around the R ribs  Numbness/Tingling: No, He reports questionable minor numbness around R ribs Focal Weakness: No, not related to current R shoulder pain Aggravating factors: leaning to the left, standing forward from a chair, bending forward "makes me feel drunk," reaching out with RUE; Relieving factors: Tylenol, topical analgesic provides temporary relief, no help with ice/heat,  24-hour pain behavior: "All day long" History of prior shoulder or neck/shoulder injury, pain, surgery, or therapy: Yes, see history Falls: Has patient fallen in last 6 months? Yes, Number of falls: 1 Dominant hand: right Imaging: Yes, Right Shoulder Radiographs - 3 views (Gr[place]hey, Axillary, Scapular Y) performed 08/31/2021: Type I acromion. Mild AC joint degenerative change with osteophyte formation. The distal clavicle, scapula, and proximal humerus are intact. Glenohumeral and acromioclavicular joint alignment appears normal. No fractures or dislocations. Well maintained glenohumeral joint space without evidence of degenerative joint disease. No soft tissue swelling or joint effusion. No destructive bony lesion is identified.  Cervical spine MRI without contrast performed 04/15/2021 IMPRESSION: Cervical spondylosis, as outlined and with findings most notably as follows: At C5-C6, a moderate-sized central disc protrusion contributes to multifactorial severe spinal canal stenosis with moderate spinal cord flattening. No definite spinal cord signal abnormality is identified, although motion degradation limits evaluation. Multifactorial bilateral neural foraminal  narrowing (moderate right, severe left). No more than mild relative spinal canal narrowing at the remaining levels. Of note, a right center disc protrusion contacts the ventral spinal cord at C6-C7. Additional sites of foraminal stenosis, as detailed and greatest on the left at C3-C4 (moderate) and on the right at C6-C7 (moderate).    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  Recent memory is intact.  Remote memory is intact.  Attention span and concentration are intact.  Expressive speech is intact.  Patient's fund of knowledge is within normal limits for educational level. Conversation is tangential with frequent diversions     Gross Musculoskeletal Assessment Tremor: None Bulk: Normal Tone: Normal Overweight  Gait Full gait assessment deferred  Posture Forward head/rounded shoulders in sitting and standing   Cervical Screen Spurlings A (ipsilateral lateral flexion/axial compression): R: Positive for reproduction of periscapular pain L: Positive Spurlings B (ipsilateral lateral flexion/contralateral rotation/axial compression): R: Positive for reproduction of periscapular pain L: Positive Repeated movement: Not performed Hoffman Sign (cervical cord compression): R: Negative L: Negative ULTT Median: R: Not examined L: Not examined ULTT Ulnar: R: Not examined L: Not examined ULTT Radial: R: Not examined L: Not examined   AROM  AROM  (Normal range in degrees) AROM 12/30/2021  Cervical  Flexion (50) 43  Extension (80) 23*  Right lateral flexion (45) 35*  Left lateral flexion (45) 35  Right rotation (85)   Left rotation (85)    Right Left  Shoulder    Flexion    Extension    Abduction    External Rotation    Internal  Rotation    Hands Behind Head    Hands Behind Back        Elbow    Flexion    Extension    Pronation    Supination    (* = pain; Blank rows = not tested)  LE MMT:  MMT (out of 5) Right 12/30/2021 Left 12/30/2021  Cervical (isometric)  Flexion Weak  Extension Weak  Lateral Flexion Weak Weak  Rotation Weak Weak      Shoulder   Flexion 4 4  Extension    Abduction 4+ 4  External rotation 3+* 3+  Internal rotation 5 5  Horizontal abduction    Horizontal adduction    Lower Trapezius    Rhomboids        Elbow  Flexion 5 5  Extension 5 5  Pronation    Supination        Wrist  Flexion 5 5  Extension 5 5  Radial deviation    Ulnar deviation        MCP  Flexion 5 5  Extension 5 5  Abduction    Adduction    (* = pain; Blank rows = not tested)  Positive reproduction of concordant pain with resisted L shoulder ER  Sensation Deferred  Reflexes Deferred   Palpation  Location LEFT  RIGHT           Subocciptials  0  Cervical paraspinals  2  Upper Trapezius    Levator Scapulae    Rhomboid Major/Minor  2  Sternoclavicular joint    Acromioclavicular joint    Coracoid process    Long head of biceps  2  Supraspinatus    Infraspinatus    Subscapularis    Teres Minor    Teres Major    Pectoralis Major    Pectoralis Minor    Anterior Deltoid  1  Lateral Deltoid    Posterior Deltoid    Latissimus Dorsi    Sternocleidomastoid    (Blank rows = not tested) Graded on 0-4 scale (0 = no pain, 1 = pain, 2 = pain with wincing/grimacing/flinching, 3 = pain with withdrawal, 4 = unwilling to allow palpation), (Blank rows = not tested)   Repeated Movements Deferred  Passive  Accessory Intervertebral Motion Deferred   Accessory Motions/Glides Deferred   SPECIAL TESTS Rotator Cuff  Drop Arm Test: Negative Painful Arc (Pain from 60 to 120 degrees scaption): Negative Infraspinatus Muscle Test: Positive   Subacromial Impingement Deferred  Labral Tear Deferred  Bicep Tendon Pathology Deferred  Shoulder Instability Deferred   Beighton scale: Deferred  FROM 09/23/21 VISIT:  AROM  AROM (Normal range in degrees) AROM 09/23/2021  Cervical  Flexion (50) 43  Extension (80) 23*  Right lateral flexion (45) 35*  Left lateral flexion (45) 35  Right rotation (85)   Left rotation (85)    Right Left  Shoulder    Flexion 160* 160*  Extension    Abduction 145* 120*  External Rotation 60* 55*  Internal Rotation 86 85  Hands Behind Head    Hands Behind Back        Elbow    Flexion WNL   Extension WNL   Pronation    Supination    (* = pain; Blank rows = not tested)  Passive Accessory Intervertebral Motion Pt denies reproduction of shoulder pain with CPA C2-T7 and R UPA C2-T7 however he is tender centrally to pressure. Generally, hypomobile throughout however difficult to assess mobility secondary to pain;  Accessory Motions/Glides Glenohumeral: Posterior: R: Painful, unable to fully assess L: not examined Inferior: R: abnormal, hypomobile and painful L: not examined Anterior: R: normal L: not examined  Acromioclavicular:  Posterior: R: Too painful to assess fully  L: not examined Anterior: R: Too painful to assess fully L: not examined  Sternoclavicular: Posterior: R: Too painful to assess fully L: not examined Anterior: R: Too painful to assess fully L: not examined Superior: R: Too painful to assess fully L: not examined Inferior: R: Too painful to assess fully L: not examined  Scapulothoracic: Painful in all directions so unable to fully assess mobility;   Muscle Length Testing Deferred   SPECIAL TESTS (all on right  side)  Subacromial Impingement Hawkins-Kennedy: Positive Neer (Block scapula, PROM flexion): Positive Painful Arc (Pain from 60 to 120 degrees scaption): Negative Empty Can: Positive External Rotation Resistance: Positive Horizontal Adduction: Negative Scapular Assist: Not examined  Labral Tear Biceps Load II (120 elevation, full ER, 90 elbow flexion, full supination, resisted elbow flexion): Negative Crank (160 scaption, axial load with IR/ER): Positive Active Compression Test: Negative  Bicep Tendon Pathology Speed (shoulder flexion to 90, external rotation, full elbow extension, and forearm supination with resistance: Negative Yergason's (resisted shoulder ER and supination/biceps tendon pathology): Not examined  Shoulder Instability Sulcus Sign: Positive Anterior Apprehension: Negative    TODAY'S TREATMENT   SUBJECTIVE: Pt reports that he is doing ok. Reports orthostatic episodes with standing up too fast. Denies falls. No R shoulder pain currently.    PAIN: Denies   There.ex: UBE x 5 minutes (2.5 forward/2.5 backwards) for warm-up, unbilled  Seated Nautilus lat pull down 95#, 3 x 12  Seated Nautilus rows with 70# 2 x 20  Seated shoulder flexion with 3# DB 1 x 20 BUE, 2 x12, 4# DB's  Seated GTB B shoulder ER:   Standing alternating punches with 2# DB's for serratus anterior strengthening. 2x12/UE      PATIENT EDUCATION:  Education details: Pt educated throughout session about proper posture and technique with exercises. Improved exercise technique, movement at target joints, use of target muscles after min to mod verbal, visual, tactile cues. Person educated: Patient Education method: Explanation, verbal cues, tactile cues, and handout Education comprehension: verbalized understanding,    HOME EXERCISE PROGRAM: Access Code: [email or web address] URL: [email or web address] Date: 09/23/2021 Prepared by: Roxana Hires  Exercises - Seated Scapular  Retraction  - 1 x daily - 7 x weekly - 2 sets - 10 reps - 2s hold - Standing Shoulder Row with Anchored Resistance  - 1 x daily - 7 x weekly - 2 sets - 10 reps - 2s hold - Shoulder W - External Rotation with Resistance  - 1 x daily - 7 x weekly - 2 sets - 10 reps - 2s hold   ASSESSMENT:  CLINICAL IMPRESSION: Pt arrived late so session was abbreviated accordingly. Continuing PT POC with pain free R shoulder. Pt required some mild re-direction to stay on task but continued with strong motivation throughout session. Pt will continue to benefit from skilled PT services to address deficits in strength, pain and decreased AROM to return to full function at home.   REHAB POTENTIAL: Fair secondary to multiple comorbidities and ongoing chronic neck pain  CLINICAL DECISION MAKING: Unstable/unpredictable  EVALUATION COMPLEXITY: High   GOALS: Goals reviewed with patient? Yes  SHORT TERM GOALS: Target date: 12/07/2021   Pt will be independent with HEP to improve strength and decrease shoulder pain to improve pain-free function at home and work.  Baseline:  Goal status: ONGOING   LONG TERM GOALS: Target date: 01/04/2022   Pt will increase FOTO to at least 62 to demonstrate significant improvement in function at home and work related to neck pain  Baseline: 09/15/21: 50; 10/21/21: 53; 12/08/21: 72 Goal status: ACHIEVED  2.  Pt will decrease worst shoulder pain by at least 3 points on the NPRS in order to demonstrate clinically significant reduction in shoulder pain. Baseline: 09/15/21: worst: 7/10; 10/21/21: worst: 7/10, 12/08/21: 4/10; Goal status: ACHIEVED  3.  Pt will decrease QuickDASH score by at least 8% in order to demonstrate clinically significant reduction in disability related to shoulder pain        Baseline: 09/15/21: 37.5%, 10/21/21: 45.5%; 12/08/21: 63.6% Goal status: ONGOING  4. Pt will increase strength of pain-free R shoulder external rotation to at least 4/5 in order to demonstrate  improvement in strength and function of RUE       Baseline: 09/15/21: 3+/5 painful, 10/21/21: 4+/5 with minimal pain, 12/08/21: 4+/5 with pain; Goal status: PARTIALLY MET   PLAN: PT FREQUENCY: 1-2x/week  PT DURATION: 8 weeks  PLANNED INTERVENTIONS: Therapeutic exercises, Therapeutic activity, Neuromuscular re-education, Balance training, Gait training, Patient/Family education, Joint manipulation, Joint mobilization, Vestibular training, Canalith repositioning, Aquatic Therapy, Dry Needling, Electrical stimulation, Spinal manipulation, Spinal mobilization, Cryotherapy, Moist heat, Traction, Ultrasound, Ionotophoresis 42m/ml Dexamethasone, and Manual therapy  PLAN FOR NEXT SESSION:  continue strengthening, review and modify HEP as needed  MSalem Caster Fairly IV, PT, DPT Physical Therapist- [facility] 12/30/2021, 4:10 PM

## 2022-01-06 NOTE — Therapy (Unsigned)
OUTPATIENT PHYSICAL THERAPY SHOULDER TREATMENT/RECERTIFICATION/DISCHARGE   Patient Name: Bobby Chen MRN: [id number] DOB:10/30/1945, 76 y.o., male Today's Date: 01/08/2022   PT End of Session - 01/07/22 1026     Visit Number 24    Number of Visits 33    Date for PT Re-Evaluation 01/14/22    Authorization Type eval: 7/00/17, recertification 4/94/49    PT Start Time 1015    PT Stop Time 1100    PT Time Calculation (min) 45 min    Activity Tolerance Patient tolerated treatment well    Behavior During Therapy [facility] for tasks assessed/performed             Past Medical History:  Diagnosis Date   Anginal pain ([place])    Brown recluse spider bite    Cancer ([place])    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, LEFT ARM    COPD (chronic obstructive pulmonary disease) ([place])    Diabetes mellitus without complication ([place])    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[place])    Elevated lipids    Hypertension    Obesity    Pulmonary arterial hypertension ([place])    Sleep apnea    Past Surgical History:  Procedure Laterality Date   ANKLE FRACTURE SURGERY     BASAL CELL CARCINOMA EXCISION N/A    scalp   COLONOSCOPY W/ POLYPECTOMY     COLONOSCOPY WITH PROPOFOL N/A 01/18/2016   Procedure: COLONOSCOPY WITH PROPOFOL;  Surgeon: Lollie Sails, MD;  Location: [facility] ENDOSCOPY;  Service: Endoscopy;  Laterality: N/A;   COLONOSCOPY WITH PROPOFOL N/A 07/02/2018   Procedure: COLONOSCOPY WITH PROPOFOL;  Surgeon: Lollie Sails, MD;  Location: [facility] ENDOSCOPY;  Service: Endoscopy;  Laterality: N/A;   ESOPHAGOGASTRODUODENOSCOPY (EGD) WITH PROPOFOL N/A 01/18/2016   Procedure: ESOPHAGOGASTRODUODENOSCOPY (EGD) WITH PROPOFOL;  Surgeon: Lollie Sails, MD;  Location: [facility] ENDOSCOPY;  Service: Endoscopy;  Laterality: N/A;   FRACTURE SURGERY Right 1984   ankle   HERNIA REPAIR     UMBILICAL   LEG SURGERY Left    NASAL SEPTOPLASTY W/ TURBINOPLASTY Bilateral 11/06/2019   Procedure: NASAL SEPTOPLASTY WITH INFERIOR TURBINATE REDUCTION;  Surgeon:  Margaretha Sheffield, MD;  Location: [facility] ORS;  Service: ENT;  Laterality: Bilateral;   toenail removal Bilateral    Patient Active Problem List   Diagnosis Date Noted   Pain in joint, ankle and foot 10/19/2021   Lymphedema 10/19/2021   Spinal stenosis of cervical region 08/26/2021   Bilateral hearing loss 04/10/2020   Difficulty walking 04/10/2020   Sensory ataxia 04/10/2020   B12 deficiency 02/03/2020   Leg pain, right 08/26/2019   Chronic venous insufficiency 08/26/2019   Venous insufficiency 08/26/2019   PAD (peripheral artery disease) ([place]) 08/14/2019   PVD (peripheral vascular disease) ([place]) 08/14/2019   Obstructive sleep apnea 05/06/2019   Obesity, Class II, BMI 35-39.9 03/11/2019   Type 2 diabetes mellitus with stage 3a chronic kidney disease, without long-term current use of insulin ([place]) 03/11/2019   Pain in joint involving ankle and foot 11/12/2018   Nuclear sclerotic cataract of both eyes 08/30/2018   Abnormal sensation in left ear 08/20/2018   Dizziness 08/20/2018   Numbness and tingling of left side of face 08/20/2018   Essential hypertension with goal blood pressure less than 130/80 03/19/2018   Healthcare maintenance 08/07/2017   Brown recluse spider bite 05/08/2017   SCC (squamous cell carcinoma), 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/2015   Severe obesity (BMI 35.0-35.9 with comorbidity) ([place]) 06/01/2015   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[place]) 09/29/2013   Hyperlipidemia associated with type 2 diabetes mellitus (  [place]) 09/29/2013    PCP: Kirk Ruths, MD  REFERRING PROVIDER: Diamond Nickel, DO  REFERRING DIAGNOSIS: M25.511 (ICD-10-CM) - Pain in right shoulder W19.XXXA (ICD-10-CM) - Unspecified fall, initial encounter [phone] (ICD-10-CM) - Other muscle spasm M75.41 (ICD-10-CM) - Impingement syndrome of right shoulder   THERAPY DIAG: Right shoulder pain, unspecified chronicity  RATIONALE FOR EVALUATION AND TREATMENT: Rehabilitation  ONSET DATE: 06/23/21 (approximate)  FOLLOW UP APPT WITH  PROVIDER: Yes, returns in June 2023 to see [name]   From initial evaluation  SUBJECTIVE:                                                                                                                                                                                         Chief Complaint: R shoulder pain  Pertinent History Pt referred to PT for R shoulder pain by orthopedics. His pain began after a fall at the end of March 2023. He was walking down steps and started leaning forward. He responded by leaning back and he fell backwards bracing himself with his arms. He experienced immediate R shoulder pain. He denies any additional falls in the last 6 months but he did have a fall about 1 year ago. He had an evaluation of his R shoulder pain at his PCP office. Plain films of his thoracic spine and ribs were obtained which did not show any rib fractures but multilevel disc changes in his thoracic spine. He reports that prior to his fall he was receiving physical therapy for his cervical stenosis with notable improvement in his pain and mobility. He was last evaluated by physiatry on 08/26/2021 for his neck pain that radiates into his right arm. This was suspected to be due to his nerve root impingement seen on recent cervical spine MRI. There is also question of carpal tunnel syndrome. He was recommended EMG of the bilateral upper extremities and epidural steroid injection to his cervical spine. Pt states that [facility] PT told him there were "unable to see him" for his R shoulder pain. Since his fall he has had more pain along his posterior right shoulder. The pain is "under my shoulder blade" and it radiates around his right trunk. He points to his lateral R ribs, scapula, and periscapular area mostly.  He denies associated swelling, shoulder locking/catching, shoulder instability, weakness, fevers or chills, night sweats, weight loss, skin color change. He has tried acetaminophen, [place] dream  cream, activity modification, chronic medication nortriptyline/pregabalin/tramadol with continued symptoms. He is right hand dominant and is retired from being a Art gallery manager. He also was previously in the [employer]   Pain:  Pain Intensity: Present: 0/10, Best: 0/10,  Worst: 7/10 Pain location: R thoracic paraspinal/periscapular region Pain Quality: dull  Radiating: Yes, around the R ribs  Numbness/Tingling: No, He reports questionable minor numbness around R ribs Focal Weakness: No, not related to current R shoulder pain Aggravating factors: leaning to the left, standing forward from a chair, bending forward "makes me feel drunk," reaching out with RUE; Relieving factors: Tylenol, topical analgesic provides temporary relief, no help with ice/heat,  24-hour pain behavior: "All day long" History of prior shoulder or neck/shoulder injury, pain, surgery, or therapy: Yes, see history Falls: Has patient fallen in last 6 months? Yes, Number of falls: 1 Dominant hand: right Imaging: Yes, Right Shoulder Radiographs - 3 views (Grashey, Axillary, Scapular Y) performed 08/31/2021: Type I acromion. Mild AC joint degenerative change with osteophyte formation. The distal clavicle, scapula, and proximal humerus are intact. Glenohumeral and acromioclavicular joint alignment appears normal. No fractures or dislocations. Well maintained glenohumeral joint space without evidence of degenerative joint disease. No soft tissue swelling or joint effusion. No destructive bony lesion is identified.  Cervical spine MRI without contrast performed 04/15/2021 IMPRESSION: Cervical spondylosis, as outlined and with findings most notably as follows: At C5-C6, a moderate-sized central disc protrusion contributes to multifactorial severe spinal canal stenosis with moderate spinal cord flattening. No definite spinal cord signal abnormality is identified, although motion degradation limits evaluation. Multifactorial bilateral neural foraminal  narrowing (moderate right, severe left). No more than mild relative spinal canal narrowing at the remaining levels. Of note, a right center disc protrusion contacts the ventral spinal cord at C6-C7. Additional sites of foraminal stenosis, as detailed and greatest on the left at C3-C4 (moderate) and on the right at C6-C7 (moderate).    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  Recent memory is intact.  Remote memory is intact.  Attention span and concentration are intact.  Expressive speech is intact.  Patient's fund of knowledge is within normal limits for educational level. Conversation is tangential with frequent diversions     Gross Musculoskeletal Assessment Tremor: None Bulk: Normal Tone: Normal Overweight  Gait Full gait assessment deferred  Posture Forward head/rounded shoulders in sitting and standing   Cervical Screen Spurlings A (ipsilateral lateral flexion/axial compression): R: Positive for reproduction of periscapular pain L: Positive Spurlings B (ipsilateral lateral flexion/contralateral rotation/axial compression): R: Positive for reproduction of periscapular pain L: Positive Repeated movement: Not performed Hoffman Sign (cervical cord compression): R: Negative L: Negative ULTT Median: R: Not examined L: Not examined ULTT Ulnar: R: Not examined L: Not examined ULTT Radial: R: Not examined L: Not examined   AROM  AROM  (Normal range in degrees) AROM 01/08/2022  Cervical  Flexion (50) 43  Extension (80) 23*  Right lateral flexion (45) 35*  Left lateral flexion (45) 35  Right rotation (85)   Left rotation (85)    Right Left  Shoulder    Flexion    Extension    Abduction    External Rotation    Internal Rotation    Hands  Behind Head    Hands Behind Back        Elbow    Flexion    Extension    Pronation    Supination    (* = pain; Blank rows = not tested)  LE MMT:  MMT (out of 5) Right 01/08/2022 Left 01/08/2022  Cervical (isometric)  Flexion Weak  Extension Weak  Lateral Flexion Weak Weak  Rotation Weak Weak      Shoulder   Flexion 4 4  Extension    Abduction 4+ 4  External rotation 3+* 3+  Internal rotation 5 5  Horizontal abduction    Horizontal adduction    Lower Trapezius    Rhomboids        Elbow  Flexion 5 5  Extension 5 5  Pronation    Supination        Wrist  Flexion 5 5  Extension 5 5  Radial deviation    Ulnar deviation        MCP  Flexion 5 5  Extension 5 5  Abduction    Adduction    (* = pain; Blank rows = not tested)  Positive reproduction of concordant pain with resisted L shoulder ER  Sensation Deferred  Reflexes Deferred   Palpation  Location LEFT  RIGHT           Subocciptials  0  Cervical paraspinals  2  Upper Trapezius    Levator Scapulae    Rhomboid Major/Minor  2  Sternoclavicular joint    Acromioclavicular joint    Coracoid process    Long head of biceps  2  Supraspinatus    Infraspinatus    Subscapularis    Teres Minor    Teres Major    Pectoralis Major    Pectoralis Minor    Anterior Deltoid  1  Lateral Deltoid    Posterior Deltoid    Latissimus Dorsi    Sternocleidomastoid    (Blank rows = not tested) Graded on 0-4 scale (0 = no pain, 1 = pain, 2 = pain with wincing/grimacing/flinching, 3 = pain with withdrawal, 4 = unwilling to allow palpation), (Blank rows = not tested)   Repeated  Movements Deferred  Passive Accessory Intervertebral Motion Deferred   Accessory Motions/Glides Deferred   SPECIAL TESTS Rotator Cuff  Drop Arm Test: Negative Painful Arc (Pain from 60 to 120 degrees scaption): Negative Infraspinatus Muscle Test: Positive   Subacromial Impingement Deferred  Labral Tear Deferred  Bicep Tendon Pathology Deferred  Shoulder Instability Deferred   Beighton scale: Deferred  FROM 09/23/21 VISIT:  AROM  AROM (Normal range in degrees) AROM 09/23/2021  Cervical  Flexion (50) 43  Extension (80) 23*  Right lateral flexion (45) 35*  Left lateral flexion (45) 35  Right rotation (85)   Left rotation (85)    Right Left  Shoulder    Flexion 160* 160*  Extension    Abduction 145* 120*  External Rotation 60* 55*  Internal Rotation 86 85  Hands Behind Head    Hands Behind Back        Elbow    Flexion WNL   Extension WNL   Pronation    Supination    (* = pain; Blank rows = not tested)  Passive Accessory Intervertebral Motion Pt denies reproduction of shoulder pain with CPA C2-T7 and R UPA C2-T7 however he is tender centrally to pressure. Generally, hypomobile throughout however difficult to assess mobility secondary to pain;  Accessory Motions/Glides Glenohumeral: Posterior: R:  Painful, unable to fully assess L: not examined Inferior: R: abnormal, hypomobile and painful L: not examined Anterior: R: normal L: not examined  Acromioclavicular:  Posterior: R: Too painful to assess fully  L: not examined Anterior: R: Too painful to assess fully L: not examined  Sternoclavicular: Posterior: R: Too painful to assess fully L: not examined Anterior: R: Too painful to assess fully L: not examined Superior: R: Too painful to assess fully L: not examined Inferior: R: Too painful to assess fully L: not examined  Scapulothoracic: Painful in all directions so unable to fully assess mobility;   Muscle Length Testing Deferred   SPECIAL  TESTS (all on right side)  Subacromial Impingement Hawkins-Kennedy: Positive Neer (Block scapula, PROM flexion): Positive Painful Arc (Pain from 60 to 120 degrees scaption): Negative Empty Can: Positive External Rotation Resistance: Positive Horizontal Adduction: Negative Scapular Assist: Not examined  Labral Tear Biceps Load II (120 elevation, full ER, 90 elbow flexion, full supination, resisted elbow flexion): Negative Crank (160 scaption, axial load with IR/ER): Positive Active Compression Test: Negative  Bicep Tendon Pathology Speed (shoulder flexion to 90, external rotation, full elbow extension, and forearm supination with resistance: Negative Yergason's (resisted shoulder ER and supination/biceps tendon pathology): Not examined  Shoulder Instability Sulcus Sign: Positive Anterior Apprehension: Negative    TODAY'S TREATMENT   SUBJECTIVE: Pt reports that he is doing well today. No significant changes since the last therapy session. Denies falls. No R shoulder pain currently. Symptoms have not changed notably over the last few weeks   PAIN: Denies   There.ex: UBE x 5 minutes (2.5 forward/2.5 backwards) for warm-up, unbilled Supine R shoulder flexion with 6# dumbbell (DB) 3 x 15; Supine R shoulder circles (CW and CCW) with 6# DB 3 x 15 each direction; Supine R shoulder serratus punch with 6# DB 3 x 15; L sidelying R shoulder ER with 2# 3 x 15; L sidelying R shoulder abduction with 4# DB 3 x 15;  Updated outcome measures:  FOTO: 64,  Worst pain: 5/10; R shoulder ER strength: 4+/5 with pain; QuickDASH: 20.5%   Not performed: Seated Nautilus lat pull down 95#, 3 x 12 Seated Nautilus rows with 70# 2 x 20 Seated shoulder flexion with 3# DB 1 x 20 BUE, 2 x12, 4# DB's Seated GTB B shoulder ER:  Standing alternating punches with 2# DB's for serratus anterior strengthening. 2x12/UE Prone R shoulder extension with 3# DB 2 x 15; Prone R shoulder row with 3# DB 2 x  15; Prone R shoulder horizontal abduction with 3# DB 2 x 15; Prone R shoulder low trap flexion with 3# DB 2 x 15;    PATIENT EDUCATION:  Education details: Pt educated throughout session about proper posture and technique with exercises. Improved exercise technique, movement at target joints, use of target muscles after min to mod verbal, visual, tactile cues, Discharge; Person educated: Patient Education method: Explanation, verbal cues, tactile cues Education comprehension: verbalized understanding and returned demonstration   HOME EXERCISE PROGRAM: Access Code: [email or web address] URL: [email or web address] Date: 09/23/2021 Prepared by: Roxana Hires  Exercises - Seated Scapular Retraction  - 1 x daily - 7 x weekly - 2 sets - 10 reps - 2s hold - Standing Shoulder Row with Anchored Resistance  - 1 x daily - 7 x weekly - 2 sets - 10 reps - 2s hold - Shoulder W - External Rotation with Resistance  - 1 x daily - 7 x weekly - 2 sets - 10 reps - 2s hold  ASSESSMENT:  CLINICAL IMPRESSION:  Updated outcome measures and goals with patient during visit today. He is independent with his HEP. His QuickDASH is significantly improved today at 20.5% and his FOTO improved considerably from 50 at initial evaluation to 64 today. Subjective outcome measures are difficult for patient due to cognition and have varied widely between updates. His R shoulder ER strength is 4+/5 but remains painful. At this point his worst pain only increases to 5/10 compared to 7/10 at the initial evaluation. Overall patients has not made progress over the last few weeks and will be discharged with instructions to follow-up with Dr. Cari Caraway and Dr. Candelaria Stagers.  REHAB POTENTIAL: Fair secondary to multiple comorbidities and ongoing chronic neck pain  CLINICAL DECISION MAKING: Unstable/unpredictable  EVALUATION COMPLEXITY: High   GOALS: Goals reviewed with patient? Yes  SHORT TERM GOALS: Target date: 12/07/2021    Pt will be independent with HEP to improve strength and decrease shoulder pain to improve pain-free function at home and work. Baseline:  Goal status: ONGOING   LONG TERM GOALS: Target date: 01/04/2022   Pt will increase FOTO to at least 62 to demonstrate significant improvement in function at home and work related to neck pain  Baseline: 09/15/21: 50; 10/21/21: 53; 12/08/21: 72; 01/07/22: 64 Goal status: ACHIEVED  2.  Pt will decrease worst shoulder pain by at least 3 points on the NPRS in order to demonstrate clinically significant reduction in shoulder pain. Baseline: 09/15/21: worst: 7/10; 10/21/21: worst: 7/10, 12/08/21: 4/10; 01/07/22: 5/10; Goal status: ONGOING  3.  Pt will decrease QuickDASH score by at least 8% in order to demonstrate clinically significant reduction in disability related to shoulder pain        Baseline: 09/15/21: 37.5%, 10/21/21: 45.5%; 12/08/21: 63.6%; 01/08/22: 20.5% Goal status: ACHIEVED  4. Pt will increase strength of pain-free R shoulder external rotation to at least 4/5 in order to demonstrate improvement in strength and function of RUE       Baseline: 09/15/21: 3+/5 painful, 10/21/21: 4+/5 with minimal pain, 12/08/21: 4+/5 with pain; 01/07/22: 4+/5 with pain Goal status: PARTIALLY MET   PLAN: PT FREQUENCY: 1x/week  PT DURATION: 1 weeks  PLANNED INTERVENTIONS: Therapeutic exercises, Therapeutic activity, Neuromuscular re-education, Balance training, Gait training, Patient/Family education, Joint manipulation, Joint mobilization, Vestibular training, Canalith repositioning, Aquatic Therapy, Dry Needling, Electrical stimulation, Spinal manipulation, Spinal mobilization, Cryotherapy, Moist heat, Traction, Ultrasound, Ionotophoresis 28m/ml Dexamethasone, and Manual therapy  PLAN FOR NEXT SESSION:  Discharge  JPhillips GroutPT, DPT, GCS  Physical Therapist[facility] 01/08/2022, 11:26 AM

## 2022-01-07 ENCOUNTER — Ambulatory Visit: Payer: Medicare Other

## 2022-01-07 DIAGNOSIS — M25511 Pain in right shoulder: Secondary | ICD-10-CM | POA: Diagnosis not present

## 2022-01-14 ENCOUNTER — Other Ambulatory Visit: Payer: Self-pay

## 2022-01-14 ENCOUNTER — Emergency Department
Admission: EM | Admit: 2022-01-14 | Discharge: 2022-01-14 | Disposition: A | Discharge status: Home / Self Care | Payer: Medicare Other | Attending: Emergency Medicine | Admitting: Emergency Medicine

## 2022-01-14 ENCOUNTER — Emergency Department: Payer: Medicare Other

## 2022-01-14 DIAGNOSIS — R0789 Other chest pain: Secondary | ICD-10-CM | POA: Insufficient documentation

## 2022-01-14 DIAGNOSIS — I2510 Atherosclerotic heart disease of native coronary artery without angina pectoris: Secondary | ICD-10-CM | POA: Insufficient documentation

## 2022-01-14 DIAGNOSIS — I509 Heart failure, unspecified: Secondary | ICD-10-CM | POA: Diagnosis not present

## 2022-01-14 DIAGNOSIS — I110 Hypertensive heart disease with heart failure: Secondary | ICD-10-CM | POA: Insufficient documentation

## 2022-01-14 LAB — CBC
HCT: 44.8 % (ref 39.0–52.0)
Hemoglobin: 14.3 g/dL (ref 13.0–17.0)
MCH: 28.8 pg (ref 26.0–34.0)
MCHC: 31.9 g/dL (ref 30.0–36.0)
MCV: 90.3 fL (ref 80.0–100.0)
Platelets: 242 10*3/uL (ref 150–400)
RBC: 4.96 MIL/uL (ref 4.22–5.81)
RDW: 12.9 % (ref 11.5–15.5)
WBC: 8.5 10*3/uL (ref 4.0–10.5)
nRBC: 0 % (ref 0.0–0.2)

## 2022-01-14 LAB — BASIC METABOLIC PANEL
Anion gap: 9 (ref 5–15)
BUN: 17 mg/dL (ref 8–23)
CO2: 29 mmol/L (ref 22–32)
Calcium: 9.4 mg/dL (ref 8.9–10.3)
Chloride: 101 mmol/L (ref 98–111)
Creatinine, Ser: 1.28 mg/dL — ABNORMAL HIGH (ref 0.61–1.24)
GFR, Estimated: 59 mL/min — ABNORMAL LOW (ref >60)
Glucose, Bld: 105 mg/dL — ABNORMAL HIGH (ref 70–99)
Potassium: 3.7 mmol/L (ref 3.5–5.1)
Sodium: 139 mmol/L (ref 135–145)

## 2022-01-14 LAB — TROPONIN I (HIGH SENSITIVITY): Troponin I (High Sensitivity): 7 ng/L (ref <18)

## 2022-01-14 NOTE — ED Triage Notes (Signed)
Pt arrives with c/o centralized chest pain/pressure that started this morning. Per pt, radiates to the left side of the chest. Pt denies n/v or SOB.

## 2022-01-14 NOTE — ED Provider Notes (Addendum)
[facility] Provider Note   Event Date/Time   First MD Initiated Contact with Patient 01/14/22 1458     (approximate) History  Chest Pain  HPI Bobby Chen is a 76 y.o. male with a stated past medical history of CHF, CAD, hypertension who presents via private vehicle after he was at an eye appointment today and when getting up from the chair he felt a pop along the left side of the sternal border that radiated to both right and the left side of his chest and was present for only a few seconds before resolving spontaneously.  Patient states that he has never had pain similar to this in the past.  Patient denies any exacerbating or relieving factors for this pain.  Patient denies any subsequent episodes of this pain ROS: Patient currently denies any vision changes, tinnitus, difficulty speaking, facial droop, sore throat, chest pain, shortness of breath, abdominal pain, nausea/vomiting/diarrhea, dysuria, or weakness/numbness/paresthesias in any extremity   Physical Exam  Triage Vital Signs: ED Triage Vitals  Enc Vitals Group     BP 01/14/22 1312 (!) 173/96     Pulse Rate 01/14/22 1312 72     Resp 01/14/22 1312 19     Temp 01/14/22 1312 98.3 F (36.8 C)     Temp Source 01/14/22 1312 Oral     SpO2 01/14/22 1312 99 %     Weight 01/14/22 1311 230 lb (104.3 kg)     Height --      Head Circumference --      Peak Flow --      Pain Score 01/14/22 1311 0     Pain Loc --      Pain Edu? --      Excl. in [place]? --    Most recent vital signs: Vitals:   01/14/22 1430 01/14/22 1500  BP: (!) 175/102 (!) 168/100  Pulse: 79 76  Resp: (!) 21 (!) 22  Temp:    SpO2: 95% 95%   General: Awake, oriented x4. CV:  Good peripheral perfusion.  Resp:  Normal effort.  Abd:  No distention.  Other:  Elderly overweight Caucasian male laying in bed in no acute distress ED Results / Procedures / Treatments  Labs (all labs ordered are listed, but only abnormal results are  displayed) Labs Reviewed  BASIC METABOLIC PANEL - Abnormal; Notable for the following components:      Result Value   Glucose, Bld 105 (*)    Creatinine, Ser 1.28 (*)    GFR, Estimated 59 (*)    All other components within normal limits  CBC  TROPONIN I (HIGH SENSITIVITY)  TROPONIN I (HIGH SENSITIVITY)   EKG ED ECG REPORT I, Naaman Plummer, the attending physician, personally viewed and interpreted this ECG. Date: 01/14/2022 EKG Time: 1308 Rate: 73 Rhythm: normal sinus rhythm QRS Axis: normal Intervals: normal ST/T Wave abnormalities: normal Narrative Interpretation: no evidence of acute ischemia RADIOLOGY ED MD interpretation: 2 view chest x-ray interpreted by me shows no evidence of acute abnormalities including no pneumonia, pneumothorax, or widened mediastinum -Agree with radiology assessment Official radiology report(s): DG Chest 2 View  Result Date: 01/14/2022 CLINICAL DATA:  Chest pain starting this morning. EXAM: CHEST - 2 VIEW COMPARISON:  Chest x-ray dated 03/31/2011 FINDINGS: Stable cardiomegaly. Lungs are clear. No pleural effusion or pneumothorax is seen. No acute-appearing osseous abnormality. IMPRESSION: 1. No active cardiopulmonary disease. No evidence of pneumonia or pulmonary edema. 2. Stable cardiomegaly. Electronically Signed  By: Franki Cabot M.D.   On: 01/14/2022 13:28   PROCEDURES: Critical Care performed: No .1-3 Lead EKG Interpretation  Performed by: Naaman Plummer, MD Authorized by: Naaman Plummer, MD     Interpretation: normal     ECG rate:  75   ECG rate assessment: normal     Rhythm: sinus rhythm     Ectopy: none     Conduction: normal    MEDICATIONS ORDERED IN ED: Medications - No data to display IMPRESSION / MDM / [place] / ED COURSE  I reviewed the triage vital signs and the nursing notes.                             The patient is on the cardiac monitor to evaluate for evidence of arrhythmia and/or significant heart  rate changes. Patient's presentation is most consistent with acute presentation with potential threat to life or bodily function. This patient presents with atypical chest pain, most likely secondary to musculoskeletal injury. Differential diagnosis includes rib fracture, costochondritis, sternal fracture. Low suspicion for ACS, acute PE (PERC negative), pericarditis / myocarditis, thoracic aortic dissection, pneumothorax, pneumonia or other acute infectious process. Presentation not consistent with other acute, emergent causes of chest pain at this time. No indication for cardiac enzyme testing. Plan to order CXR to evaluate for acute cardiopulmonary causes.  Plan: EKG, CXR, pain control  Dispo: Discharge home with home care   FINAL CLINICAL IMPRESSION(S) / ED DIAGNOSES   Final diagnoses:  Atypical chest pain   Rx / DC Orders   ED Discharge Orders     None      Note:  This document was prepared using Dragon voice recognition software and may include unintentional dictation errors.   Naaman Plummer, MD 01/14/22 Randol Kern    Naaman Plummer, MD 01/14/22 [phone]

## 2022-01-19 ENCOUNTER — Telehealth (INDEPENDENT_AMBULATORY_CARE_PROVIDER_SITE_OTHER): Payer: Self-pay

## 2022-01-19 NOTE — Telephone Encounter (Signed)
Pt called concerning lymph pump that was ordered in July.  I gave them the number to [place] and told him to call back with any new questions or concerns. He stated understanding

## 2022-02-21 ENCOUNTER — Encounter (INDEPENDENT_AMBULATORY_CARE_PROVIDER_SITE_OTHER): Payer: Self-pay

## 2022-03-10 ENCOUNTER — Ambulatory Visit (INDEPENDENT_AMBULATORY_CARE_PROVIDER_SITE_OTHER): Payer: Medicare Other | Admitting: Neurosurgery

## 2022-03-10 ENCOUNTER — Encounter: Payer: Self-pay | Admitting: Neurosurgery

## 2022-03-10 VITALS — BP 183/121 | HR 79 | Ht 67.0 in | Wt 223.4 lb

## 2022-03-10 DIAGNOSIS — G959 Disease of spinal cord, unspecified: Secondary | ICD-10-CM

## 2022-03-10 DIAGNOSIS — M4802 Spinal stenosis, cervical region: Secondary | ICD-10-CM

## 2022-03-10 NOTE — Progress Notes (Signed)
Progress Note: Referring Physician:  Kirk Ruths, MD [facility] [place],  [place] [address]  Primary Physician:  Kirk Ruths, MD  Chief Complaint:  f/u of cervical myelopathy   History of Present Illness: 03/10/2022 He continues to have relatively stable findings.  He has had multiple falls over the past several months.  Most of these are mechanical.  He has intermittent numbness or tingling in his hands  12/02/2021 Mr. Charter returns to clinic.  He is having some issues with imbalance.  He was having vertigo, but he underwent Epley maneuvers at physical therapy office and feels better now.  He has no other symptoms of myelopathy.  10/28/21 from [name]: ZAYLAN KISSOON is a 76 y.o. male who presents for 3 month follow up of cervical stenosis and watch for symptoms of cervical myelopathy.  He was seen in early April and was doing fairly well at that time however he suffered a fall shortly after and has had increased right scapular pain worse with reaching forward and overhead he describes as sharp in nature.  In by his PCP and x-rays were obtained which were largely normal however his symptoms have persisted.  He also states that he has had more difficulty getting up from a seated position since this fall.  He states that he has had an increased number of falls over the last year but attributes this to his blood pressure and a recent diagnosis of vertigo.  He is currently scheduled to start physical therapy next week.  He denies any new or worsening radicular arm symptoms.  He does have a history of carpal tunnel and has constant numbness and tingling in his hands although states this has worsened since his fall in April.  LOV:  07/27/21 EFE FAZZINO is a 76 y.o. male with known cervical stenosis and neck who presents for 6 week follow up after PT. he reports increased range of motion in his neck however he does continue to have some  right-sided neck pain and interscapular pain when rotating his head to the right. Physical therapy is working on this. He denies any radiating arm symptoms, numbness or tingling into his hands, changes in dexterity, or weakness.  HPI by Dr. Izora Ribas: 05/31/2021 Mr. Mattis Featherly is here today with a chief complaint of neck pain/stiffness and limited ROM. He also reports pain that radiates down the middle of his upper back and into his ears bilaterally. He denies numbness, tingling, or trouble with dexterity. He reports he has had balance issues since breaking his right ankle in 1984.   He has been having issues since August 2022. He has pain towards the top of his neck and has difficulty with rotating to the left.    Bowel/Bladder Dysfunction: none   Conservative measures:  Physical therapy: has not participated Multimodal medical therapy including regular antiinflammatories:  Robaxin, Lyrica, Ultram Injections: has not received epidural steroid injections   Past Surgery: none   MACLIN GUERRETTE has no symptoms of cervical myelopathy.   The symptoms are causing a significant impact on the patient's life.    HPI by [name], PA: 04/28/2021: Telephone call: "I spoke with Mr. Castellana at length regarding his MRI results and concern for stenosis at C5-6. His primary complaint continues to be stiffness and neck pain with limited ROM to the left. He continues to be without any numbness, tingling, imbalance, or trouble with dexterity.  We did discuss  further options regarding his cervical stenosis and I expressed that this was unlikely the cause of his neck pain but could manifest as myelopathy in the future. We discussed the symptoms of cervical myelopathy.  We discussed treatment options such as PT which his insurance still will not cover according to him, and injections which he is not interested in. He would like to meet with Dr. Izora Ribas about his cervical stenosis and discuss surgical options.  He  was encouraged to call the office in the interim with any questions or concerns." 04/01/2021 Mr. Rashid Whitenight is a 76 y.o with a history of diabetes, hypertension, chronic pain, here today at the request of ENT for further evaluation of his neck.  He states that he has had neck pain that radiates down the middle of his upper back and into his ears bilaterally since August of this year without any inciting event.  He was recently seen by ENT who referred him to neurosurgery for further evaluation of "pitched nerves" in his cervical spine.  He denies any radiating arm pain.  He states that turning his head from side to side particularly when driving is more difficult and will occasionally cause the pain in his neck.  He also endorses some pain with looking up and down.  His primary complaint however is dizziness which seems to occur somewhat reasonably as well as ringing in his ears.  He underwent physical therapy for his right lower extremity and was referred to physical therapy for his neck and for gait training however his insurance not cover anymore sessions this year.  He denies any upper extremity weakness.  Of note he endorses several episodes of dried blood in his left ear.  He denies any trauma or using any penetrating objects.  Exam: There were no vitals filed for this visit.   NEUROLOGICAL:  General: In no acute distress.   Awake, alert, oriented to person, place, and time.  Pupils equal round and reactive to light.  Facial tone is symmetric.  Tongue protrusion is midline.  There is no pronator drift.  ROM of spine: full.  Palpation of spine: nontender.    Strength: Side Biceps Triceps Deltoid Interossei Grip Wrist Ext. Wrist Flex.  R '5 5 5 5 5 5 5  '$ L '5 5 5 5 5 5 5    '$ MSK: Pain with abduction of the right shoulder  Imaging: 04/15/21 MRI C spine IMPRESSION: Cervical spondylosis, as outlined and with findings most notably as follows.   At C5-C6, a moderate-sized central disc  protrusion contributes to multifactorial severe spinal canal stenosis with moderate spinal cord flattening. No definite spinal cord signal abnormality is identified, although motion degradation limits evaluation. Multifactorial bilateral neural foraminal narrowing (moderate right, severe left).   No more than mild relative spinal canal narrowing at the remaining levels. Of note, a right center disc protrusion contacts the ventral spinal cord at C6-C7.   Additional sites of foraminal stenosis, as detailed and greatest on the left at C3-C4 (moderate) and on the right at C6-C7 (moderate).     Electronically Signed   By: Kellie Simmering D.O.   On: 04/15/2021 12:34  MRI C spine 11/26/21 IMPRESSION: 1. Overall no significant interval change since 04/15/2021. No abnormal enhancement. 2. Stable degenerative changes most advanced at C5-C6 where there is unchanged severe spinal canal stenosis and severe left and moderate right neural foraminal stenosis. No definite cord signal abnormality. 3. Unchanged moderate to severe left neural foraminal stenosis at C3-C4.  Electronically Signed   By: Valetta Mole M.D.   On: 11/26/2021 13:44  MRI T spine 11/13/21 IMPRESSION: 1. Prominent spondylitic spurring and ligamentum flavum thickening at T3-4 to T9-10. Nodular thickening of the ligamentum flavum flattens the cord from behind at T3-4 and T9-10. No convincing cord signal abnormality. 2. Diffusely patent foramina.     Electronically Signed   By: Jorje Guild M.D.   On: 11/14/2021 05:58 I have personally reviewed the images and agree with the above interpretation.  Assessment and Plan: Mr. Cardoza is a pleasant 76 y.o. male with known cervical stenosis.   I do not feel that he is overtly myelopathic at this time.  I have recommended continue to watch his symptoms.  I will see him back in 4-6 months to monitor his symptomatology.   I spent a total of 15 minutes in both face-to-face and  non-face-to-face activities for this visit on the date of this encounter.  This note was generated in part with voice recognition software and I apologize for any typographical errors that were not detected and corrected.  Meade Maw MD Neurosurgery

## 2022-04-20 ENCOUNTER — Ambulatory Visit (INDEPENDENT_AMBULATORY_CARE_PROVIDER_SITE_OTHER): Payer: Medicare Other | Admitting: Vascular Surgery

## 2022-04-20 ENCOUNTER — Encounter (INDEPENDENT_AMBULATORY_CARE_PROVIDER_SITE_OTHER): Payer: Self-pay | Admitting: Nurse Practitioner

## 2022-04-20 VITALS — BP 147/91 | HR 72 | Resp 16 | Wt 216.8 lb

## 2022-04-20 DIAGNOSIS — I890 Lymphedema, not elsewhere classified: Secondary | ICD-10-CM | POA: Diagnosis not present

## 2022-04-20 NOTE — Progress Notes (Unsigned)
SUBJECTIVE:  Patient ID: Bobby Chen, male    DOB: 30-Sep-1945, 76 y.o.   MRN: [id number] Chief Complaint  Patient presents with   Follow-up    6 month follow up    Patient returns today in follow up of right leg swelling on referral from his neurologist.  He has had several falls over the past several months and this seems to have correlated with decreased activity.  The right leg is always been worse than the left.  This dates back to a major ankle fracture almost 40 years ago which likely was the nidus for his lymphedema in that leg.  He also had a brown recluse spider bite on that right leg which likely was a contributor as well.  He was last seen by Eulogio Ditch NP  a little over 6 months ago where some mild venous disease was seen but this was largely felt to be lymphedema.  He continues to do well with compression socks and did not require anything further. No open wounds or infection.  No fevers or chills.  No chest pain or shortness of breath.   No studies were completed today. Patient on follow up had very little swelling to bilateral lower extremities. A lymphedema pump was ordered for him. They called the patient 8 times before he realized it was the company needing to come measure him. He states after being measured they never returned with a pump. Patient feels he does not need the lymphedema pump at this time. I agree with this plan. I had a long discussion with him urging him to continue with the conservative measures to control his lymphedema.      Bobby Chen is a 76 y.o. 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, LEFT ARM    COPD (chronic obstructive pulmonary disease) ([place])    Diabetes mellitus without complication (HCC)    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[place])    Elevated lipids    Hypertension    Obesity    Pulmonary arterial hypertension ([place])    Sleep apnea     Past Surgical  History:  Procedure Laterality Date   ANKLE FRACTURE SURGERY     BASAL CELL CARCINOMA EXCISION N/A    scalp   COLONOSCOPY W/ POLYPECTOMY     COLONOSCOPY WITH PROPOFOL N/A 01/18/2016   Procedure: COLONOSCOPY WITH PROPOFOL;  Surgeon: Lollie Sails, MD;  Location: [facility] ENDOSCOPY;  Service: Endoscopy;  Laterality: N/A;   COLONOSCOPY WITH PROPOFOL N/A 07/02/2018   Procedure: COLONOSCOPY WITH PROPOFOL;  Surgeon: Lollie Sails, MD;  Location[facility] ENDOSCOPY;  Service: Endoscopy;  Laterality: N/A;   ESOPHAGOGASTRODUODENOSCOPY (EGD) WITH PROPOFOL N/A 01/18/2016   Procedure: ESOPHAGOGASTRODUODENOSCOPY (EGD) WITH PROPOFOL;  Surgeon: Lollie Sails, MD;  Location: [facility] ENDOSCOPY;  Service: Endoscopy;  Laterality: N/A;   FRACTURE SURGERY Right 1984   ankle   HERNIA REPAIR     UMBILICAL   LEG SURGERY Left    NASAL SEPTOPLASTY W/ TURBINOPLASTY Bilateral 11/06/2019   Procedure: NASAL SEPTOPLASTY WITH INFERIOR TURBINATE REDUCTION;  Surgeon: Margaretha Sheffield, MD;  Location: [facility] ORS;  Service: ENT;  Laterality: Bilateral;   toenail removal Bilateral     Social History   Socioeconomic History   Marital status: Married    Spouse name: Not on file   Number of children: Not on file   Years of  education: Not on file   Highest education level: Not on file  Occupational History   Not on file  Tobacco Use   Smoking status: Never   Smokeless tobacco: Never  Vaping Use   Vaping Use: Never used  Substance and Sexual Activity   Alcohol use: No   Drug use: No   Sexual activity: Not on file  Other Topics Concern   Not on file  Social History Narrative   Not on file   Social Determinants of Health   Financial Resource Strain: Not on file  Food Insecurity: Not on file  Transportation Needs: Not on file  Physical Activity: Not on file  Stress: Not on file  Social Connections: Not on file  Intimate Partner Violence: Not on file    Family History  Problem Relation Age of Onset   Ovarian  cancer Mother    Ulcers Father     Allergies  Allergen Reactions   Amoxil [Amoxicillin] Nausea And Vomiting   Aspirin Buf(Cacarb-Mgcarb-Mgo)     "knots" on the back of head   Aspirin Buffered     Other reaction(s): Other (See Comments)     Review of Systems  Cardiovascular:  Positive for leg swelling.  Neurological:  Positive for weakness.     Review of Systems: Negative Unless Checked Constitutional: '[]'$ Weight loss  '[]'$ Fever  '[]'$ Chills Cardiac: '[]'$ Chest pain   '[]'$  Atrial Fibrillation  '[]'$ Palpitations   '[]'$ Shortness of breath when laying flat   '[]'$ Shortness of breath with exertion. '[]'$ Shortness of breath at rest Vascular:  '[]'$ Pain in legs with walking   '[]'$ Pain in legs with standing '[]'$ Pain in legs when laying flat   '[]'$ Claudication    '[]'$ Pain in feet when laying flat    '[]'$ History of DVT   '[]'$ Phlebitis   '[x]'$ Swelling in legs   '[]'$ Varicose veins   '[]'$ Non-healing ulcers Pulmonary:   '[]'$ Uses home oxygen   '[]'$ Productive cough   '[]'$ Hemoptysis   '[]'$ Wheeze  '[]'$ COPD   '[]'$ Asthma Neurologic:  '[]'$ Dizziness   '[]'$ Seizures  '[]'$ Blackouts '[]'$ History of stroke   '[]'$ History of TIA  '[]'$ Aphasia   '[]'$ Temporary Blindness   '[]'$ Weakness or numbness in arm   '[]'$ Weakness or numbness in leg Musculoskeletal:   '[x]'$ Joint swelling   '[x]'$ Joint pain   '[x]'$ Low back pain  '[]'$  History of Knee Replacement '[x]'$ Arthritis '[]'$ back Surgeries  '[]'$  Spinal Stenosis    Hematologic:  '[]'$ Easy bruising  '[]'$ Easy bleeding   '[]'$ Hypercoagulable state   '[]'$ Anemic Gastrointestinal:  '[]'$ Diarrhea   '[]'$ Vomiting  '[]'$ Gastroesophageal reflux/heartburn   '[]'$ Difficulty swallowing. '[]'$ Abdominal pain Genitourinary:  '[]'$ Chronic kidney disease   '[]'$ Difficult urination  '[]'$ Anuric   '[]'$ Blood in urine '[]'$ Frequent urination  '[]'$ Burning with urination   '[]'$ Hematuria Skin:  '[]'$ Rashes   '[]'$ Ulcers '[]'$ Wounds Psychological:  '[]'$ History of anxiety   '[]'$  History of major depression  '[]'$  Memory Difficulties      OBJECTIVE:   Physical Exam  BP (!) 147/91 (BP Location: Left Arm)   Pulse 72   Resp 16   Wt 216 lb 12.8  oz (98.3 kg)   BMI 33.96 kg/m   Gen: WD/WN, NAD Head: [place]/AT, No temporalis wasting.  Ear/Nose/Throat: Hearing grossly intact, nares w/o erythema or drainage Eyes: PER, EOMI, sclera nonicteric.  Neck: Supple, no masses.  No JVD.  Pulmonary:  Good air movement, no use of accessory muscles.  Cardiac: RRR Vascular: Very little right leg swelling Vessel Right Left  Radial Palpable Palpable  Brachial     Femoral    Popliteal    Dorsalis Pedis  Posterior Tibial Palpable Palpable   Gastrointestinal: soft, non-distended. No guarding/no peritoneal signs.  Musculoskeletal: M/S 5/5 throughout.  No deformity or atrophy.  Neurologic: Pain and light touch intact in extremities.  Symmetrical.  Speech is fluent. Motor exam as listed above. Psychiatric: Judgment intact, Mood & affect appropriate for pt's clinical situation. Dermatologic: No Venous rashes. No Ulcers Noted.  No changes consistent with cellulitis. Lymph : No Cervical lymphadenopathy, no lichenification or skin changes of chronic lymphedema.       ASSESSMENT AND PLAN:   1. Lymphedema Recommend:   No surgery or intervention at this point in time.     I have reviewed my discussion with the patient regarding lymphedema and why it  causes symptoms.  Patient will continue wearing graduated compression on a daily basis. The patient should put the compression on first thing in the morning and removing them in the evening. The patient should not sleep in the compression.    In addition, behavioral modification throughout the day will be continued.  This will include frequent elevation (such as in a recliner), use of over the counter pain medications as needed and exercise such as walking.   The systemic causes for chronic edema such as liver, kidney and cardiac etiologies do not appear to have significant changed over the past year.       Patient should follow-up in one year or as needed.     2. Type 2 diabetes mellitus with stage  3a chronic kidney disease, without long-term current use of insulin (HCC) Continue hypoglycemic medications as already ordered, these medications have been reviewed and there are no changes at this time.   Hgb A1C to be monitored as already arranged by primary service    3. Essential hypertension with goal blood pressure less than 130/80 Continue antihypertensive medications as already ordered, these medications have been reviewed and there are no changes at this time.   Current Outpatient Medications on File Prior to Visit  Medication Sig Dispense Refill   acetaminophen (TYLENOL) 500 MG tablet Take 500 mg by mouth every 6 (six) hours as needed.     albuterol (PROVENTIL HFA;VENTOLIN HFA) 108 (90 Base) MCG/ACT inhaler Inhale 2 puffs into the lungs every 4 (four) hours as needed for wheezing or shortness of breath.     Bioflavonoid Products (ESTER C PO) Take 500 mg by mouth daily.     brimonidine-timolol (COMBIGAN) 0.2-0.5 % ophthalmic solution Place 1 drop into both eyes every 12 (twelve) hours.     budesonide (PULMICORT FLEXHALER) 180 MCG/ACT inhaler Inhale into the lungs.     chlorhexidine (PERIDEX) 0.12 % solution SMARTSIG:By Mouth     citalopram (CELEXA) 10 MG tablet Take 1 tablet by mouth daily.     cyclobenzaprine (FLEXERIL) 10 MG tablet Take 10 mg by mouth in the morning, at noon, and at bedtime.     Dulaglutide (TRULICITY) 3 BP/1.0CH SOPN Inject into the skin once a week.     empagliflozin (JARDIANCE) 10 MG TABS tablet Take 1 tablet by mouth daily.     fluticasone (FLONASE) 50 MCG/ACT nasal spray Place 2 sprays into both nostrils daily. 16 g 0   losartan (COZAAR) 100 MG tablet Take 100 mg by mouth daily.     metFORMIN (GLUCOPHAGE-XR) 500 MG 24 hr tablet Take 1,000 mg by mouth 2 (two) times daily.      montelukast (SINGULAIR) 10 MG tablet      nortriptyline (PAMELOR) 10 MG capsule Take 50 mg by mouth.  pantoprazole (PROTONIX) 40 MG tablet Take 40 mg by mouth daily as needed  (heartburn).      pregabalin (LYRICA) 50 MG capsule Take 50 mg by mouth 2 (two) times daily.     traMADol (ULTRAM) 50 MG tablet Take 1 tablet (50 mg total) by mouth every 6 (six) hours as needed. 15 tablet 0   VITAMIN A PO Take 1 tablet by mouth daily.     No current facility-administered medications on file prior to visit.    There are no Patient Instructions on file for this visit. No follow-ups on file.   Drema Pry, NP  This note was completed with Sales executive.  Any errors are purely unintentional.

## 2022-06-07 ENCOUNTER — Other Ambulatory Visit: Payer: Self-pay

## 2022-06-07 ENCOUNTER — Ambulatory Visit
Admission: RE | Admit: 2022-06-07 | Discharge: 2022-06-07 | Disposition: A | Discharge status: Home / Self Care | Payer: Medicare Other | Source: Ambulatory Visit | Attending: Internal Medicine | Admitting: Internal Medicine

## 2022-06-07 VITALS — BP 145/87 | HR 67 | Temp 97.7°F | Resp 20

## 2022-06-07 DIAGNOSIS — R519 Headache, unspecified: Secondary | ICD-10-CM | POA: Diagnosis not present

## 2022-06-07 DIAGNOSIS — H9203 Otalgia, bilateral: Secondary | ICD-10-CM | POA: Diagnosis not present

## 2022-06-07 NOTE — ED Provider Notes (Signed)
[facility]    CSN: [id number] Arrival date & time: 06/07/22  1346      History   Chief Complaint Chief Complaint  Patient presents with   Otalgia   Appointment    1400    HPI Bobby Chen is a 77 y.o. male presenting for onset of sharp right-sided headaches and ear pain as well as pain in the left ear occasionally yesterday.  Patient says he thinks he has had some clear drainage from the right ear.  He also reports hearing issues but says that is a chronic issue.  He notes that he has a history of nerve damage of the nerve of his scalp due to history of combat.  He says he was told this by the neurologist.  Patient has taken Tylenol for his headache and ear pain and says that it has helped.  He says that he has had this same exact pain in the past and been told that it was related to the chronic nerve problem.  Patient wants to check to make sure he does not have any earwax buildup.  He reports that he has had a problem with that in the past and had to have the ears cleaned out about a year ago.  He denies any cough, congestion, sore throat, sinus pain.  No dizziness, weakness, severe headaches, vision changes, numbness/tingling, extremity weakness, facial drooping, difficulty speaking or walking.  History of vertigo and is afraid that he could get vertigo again.  His medical history is significant for diabetes with diabetic neuritis, hypertension, obesity, pulmonary artery hypertension, skin cancer, angina, peripheral vascular disease, hearing loss, stage III kidney disease, obstructive sleep apnea, hyperlipidemia.  HPI  Past Medical History:  Diagnosis Date   Anginal pain ([place])    Brown recluse spider bite    Cancer ([place])    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, LEFT ARM    COPD (chronic obstructive pulmonary disease) ([place])    Diabetes mellitus without complication ([place])    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[place])    Elevated lipids    Hypertension    Obesity    Pulmonary arterial hypertension ([place])     Sleep apnea     Patient Active Problem List   Diagnosis Date Noted   Pain in joint, ankle and foot 10/19/2021   Lymphedema 10/19/2021   Spinal stenosis of cervical region 08/26/2021   Bilateral hearing loss 04/10/2020   Difficulty walking 04/10/2020   Sensory ataxia 04/10/2020   B12 deficiency 02/03/2020   Leg pain, right 08/26/2019   Chronic venous insufficiency 08/26/2019   Venous insufficiency 08/26/2019   PAD (peripheral artery disease) ([place]) 08/14/2019   PVD (peripheral vascular disease) ([place]) 08/14/2019   Obstructive sleep apnea 05/06/2019   Obesity, Class II, BMI 35-39.9 03/11/2019   Type 2 diabetes mellitus with stage 3a chronic kidney disease, without long-term current use of insulin ([place]) 03/11/2019   Pain in joint involving ankle and foot 11/12/2018   Nuclear sclerotic cataract of both eyes 08/30/2018   Abnormal sensation in left ear 08/20/2018   Dizziness 08/20/2018   Numbness and tingling of left side of face 08/20/2018   Essential hypertension with goal blood pressure less than 130/80 03/19/2018   Healthcare maintenance 08/07/2017   Brown recluse spider bite 05/08/2017   SCC (squamous cell carcinoma), 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/2015   Severe obesity (BMI 35.0-35.9 with comorbidity) ([place]) 06/01/2015   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/29/2013   Hyperlipidemia associated with type 2 diabetes mellitus ([place]) 09/29/2013    Past Surgical  History:  Procedure Laterality Date   ANKLE FRACTURE SURGERY     BASAL CELL CARCINOMA EXCISION N/A    scalp   COLONOSCOPY W/ POLYPECTOMY     COLONOSCOPY WITH PROPOFOL N/A 01/18/2016   Procedure: COLONOSCOPY WITH PROPOFOL;  Surgeon: Lollie Sails, MD;  Location: [facility] ENDOSCOPY;  Service: Endoscopy;  Laterality: N/A;   COLONOSCOPY WITH PROPOFOL N/A 07/02/2018   Procedure: COLONOSCOPY WITH PROPOFOL;  Surgeon: Lollie Sails, MD;  Location: [facility] ENDOSCOPY;  Service: Endoscopy;  Laterality: N/A;   ESOPHAGOGASTRODUODENOSCOPY (EGD) WITH PROPOFOL  N/A 01/18/2016   Procedure: ESOPHAGOGASTRODUODENOSCOPY (EGD) WITH PROPOFOL;  Surgeon: Lollie Sails, MD;  Location: [facility] ENDOSCOPY;  Service: Endoscopy;  Laterality: N/A;   FRACTURE SURGERY Right 1984   ankle   HERNIA REPAIR     UMBILICAL   LEG SURGERY Left    NASAL SEPTOPLASTY W/ TURBINOPLASTY Bilateral 11/06/2019   Procedure: NASAL SEPTOPLASTY WITH INFERIOR TURBINATE REDUCTION;  Surgeon: Margaretha Sheffield, MD;  Location: [facility] ORS;  Service: ENT;  Laterality: Bilateral;   toenail removal Bilateral        Home Medications    Prior to Admission medications   Medication Sig Start Date End Date Taking? Authorizing Provider  acetaminophen (TYLENOL) 500 MG tablet Take 500 mg by mouth every 6 (six) hours as needed.    [provider]  albuterol (PROVENTIL HFA;VENTOLIN HFA) 108 (90 Base) MCG/ACT inhaler Inhale 2 puffs into the lungs every 4 (four) hours as needed for wheezing or shortness of breath.    [provider]  Bioflavonoid Products (ESTER C PO) Take 500 mg by mouth daily.    [provider]  brimonidine-timolol (COMBIGAN) 0.2-0.5 % ophthalmic solution Place 1 drop into both eyes every 12 (twelve) hours. 01/29/22   [provider]  budesonide (PULMICORT FLEXHALER) 180 MCG/ACT inhaler Inhale into the lungs. 12/29/20 04/20/22  [provider]  chlorhexidine (PERIDEX) 0.12 % solution SMARTSIG:By Mouth 07/01/21   [provider]  citalopram (CELEXA) 10 MG tablet Take 1 tablet by mouth daily. 11/06/20   [provider]  cyclobenzaprine (FLEXERIL) 10 MG tablet Take 10 mg by mouth in the morning, at noon, and at bedtime.    [provider]  Dulaglutide (TRULICITY) 3 0000000 SOPN Inject into the skin once a week.    [provider]  empagliflozin (JARDIANCE) 10 MG TABS tablet Take 1 tablet by mouth daily. 02/25/22 02/25/23  [provider]  fluticasone (FLONASE) 50 MCG/ACT nasal spray Place 2 sprays into both  nostrils daily. 05/31/18   Lorin Picket, PA-C  losartan (COZAAR) 100 MG tablet Take 100 mg by mouth daily. 11/10/21 11/10/22  [provider]  metFORMIN (GLUCOPHAGE-XR) 500 MG 24 hr tablet Take 1,000 mg by mouth 2 (two) times daily.     [provider]  montelukast (SINGULAIR) 10 MG tablet  12/10/20   [provider]  nortriptyline (PAMELOR) 10 MG capsule Take 50 mg by mouth. 08/18/21   [provider]  pantoprazole (PROTONIX) 40 MG tablet Take 40 mg by mouth daily as needed (heartburn).  01/09/17   [provider]  pregabalin (LYRICA) 50 MG capsule Take 50 mg by mouth 2 (two) times daily. 08/18/21   [provider]  traMADol (ULTRAM) 50 MG tablet Take 1 tablet (50 mg total) by mouth every 6 (six) hours as needed. 03/13/21   Marney Setting, NP  VITAMIN A PO Take 1 tablet by mouth daily.    [provider]  Family History Family History  Problem Relation Age of Onset   Ovarian cancer Mother    Ulcers Father     Social History Social History   Tobacco Use   Smoking status: Never   Smokeless tobacco: Never  Vaping Use   Vaping Use: Never used  Substance Use Topics   Alcohol use: No   Drug use: No     Allergies   Amoxil [amoxicillin], Aspirin buf(cacarb-mgcarb-mgo), and Aspirin buffered   Review of Systems Review of Systems  Constitutional:  Negative for fatigue and fever.  HENT:  Positive for ear pain. Negative for congestion, ear discharge, hearing loss, rhinorrhea and sore throat.   Eyes:  Negative for photophobia and visual disturbance.  Respiratory:  Negative for cough and shortness of breath.   Cardiovascular:  Negative for chest pain.  Gastrointestinal:  Negative for nausea and vomiting.  Skin:  Negative for rash and wound.  Neurological:  Positive for headaches. Negative for dizziness, tremors, seizures, syncope, facial asymmetry, speech difficulty, weakness, light-headedness and numbness.      Physical Exam Triage Vital Signs ED Triage Vitals  Enc Vitals Group     BP      Pulse      Resp      Temp      Temp src      SpO2      Weight      Height      Head Circumference      Peak Flow      Pain Score      Pain Loc      Pain Edu?      Excl. in [place]?    No data found.  Updated Vital Signs BP (!) 145/87 (BP Location: Left Arm)   Pulse 67   Temp 97.7 F (36.5 C) (Oral)   Resp 20   SpO2 96%      Physical Exam Vitals and nursing note reviewed.  Constitutional:      General: He is not in acute distress.    Appearance: Normal appearance. He is well-developed. He is not ill-appearing.  HENT:     Head: Normocephalic and atraumatic.     Right Ear: Tympanic membrane, ear canal and external ear normal.     Left Ear: Tympanic membrane, ear canal and external ear normal.     Nose: Nose normal.     Mouth/Throat:     Mouth: Mucous membranes are moist.     Pharynx: Oropharynx is clear.  Eyes:     General: No scleral icterus.    Extraocular Movements: Extraocular movements intact.     Conjunctiva/sclera: Conjunctivae normal.     Pupils: Pupils are equal, round, and reactive to light.  Cardiovascular:     Rate and Rhythm: Normal rate and regular rhythm.     Heart sounds: Normal heart sounds.  Pulmonary:     Effort: Pulmonary effort is normal. No respiratory distress.     Breath sounds: Normal breath sounds.  Musculoskeletal:     Cervical back: Neck supple.  Skin:    General: Skin is warm and dry.     Capillary Refill: Capillary refill takes less than 2 seconds.     Findings: No rash.  Neurological:     General: No focal deficit present.     Mental Status: He is alert and oriented to person, place, and time. Mental status is at baseline.     Cranial Nerves: No cranial nerve deficit.     Motor:  No weakness.     Gait: Gait normal.  Psychiatric:        Mood and Affect: Mood normal.        Behavior: Behavior normal.      UC Treatments / Results   Labs (all labs ordered are listed, but only abnormal results are displayed) Labs Reviewed - No data to display  EKG   Radiology No results found.  Procedures Procedures (including critical care time)  Medications Ordered in UC Medications - No data to display  Initial Impression / Assessment and Plan / UC Course  I have reviewed the triage vital signs and the nursing notes.  Pertinent labs & imaging results that were available during my care of the patient were reviewed by me and considered in my medical decision making (see chart for details).   77 year old male presents for bilateral ear pain which is worse on the right side right-sided headache since yesterday.  This is not out of the ordinary for him as he has a history of headaches like this due to previous nerve damage from combat.  Patient wants to make sure he does not have cerumen impaction as he has had ear pain related to cerumen impaction as well.  No recent illnesses.  He is denying any severe headaches, vision changes, balance or speech problems, numbness/tingling.  Has taken Tylenol which has helped.  His vitals are able.  He is overall well-appearing.  Normal cranial nerve exam.  No evidence of rash of his scalp or face.  Ears are clear without evidence of infection, impaction of cerumen.  No nasal congestion.  Throat is clear.  Chest clear to auscultation heart regular rate and rhythm.  Unsure as to the cause of his symptoms but it seems more related to a headache.  No evidence of obvious ear problem/condition upon my exam.  I have advised him to continue with the Tylenol, rest and fluids.  Very close monitoring and advised him to go to the ER if the headache worsens or he starts to have any red flag signs or symptoms which are outlined in the handout.  Otherwise, advised him to follow-up with his ENT specialist and neurologist if symptoms continue.  He reports to me that he has had CT scans and MRIs due to this problem in  the past and they have been essentially normal.  He does agree to go to the ER if anything worsens.   Final Clinical Impressions(s) / UC Diagnoses   Final diagnoses:  Acute nonintractable headache, unspecified headache type  Otalgia of both ears     Discharge Instructions      HEADACHE: You were seen in clinic today for headache. Rest and take meds as directed. If at any point, the headache becomes very severe, is associated with fever, is associated with neck pain/stiffness, you feel like passing out, the headache is different from any you've have had before, there are vision changes/issues with speech/issues with balance, or numbness/weakness in a part of the body, you should be seen urgently or emergently for more serious causes of headache.  Try Meclizine over the counter if vertigo. Follow up with your PCP and neurologist.      ED Prescriptions   None    PDMP not reviewed this encounter.   Danton Clap, PA-C 06/07/22 [phone]

## 2022-06-07 NOTE — ED Triage Notes (Addendum)
Bilateral ear pain started yesterday.  Right ear feels like there is an ice pick being poked in ear.  Patient reports there is an area on scalp above right ear that is very painful, sharp shooting pain.    Has called ENT x 2 and neither could see him   Has had tylenol and tramadol

## 2022-06-07 NOTE — Discharge Instructions (Signed)
HEADACHE: You were seen in clinic today for headache. Rest and take meds as directed. If at any point, the headache becomes very severe, is associated with fever, is associated with neck pain/stiffness, you feel like passing out, the headache is different from any you've have had before, there are vision changes/issues with speech/issues with balance, or numbness/weakness in a part of the body, you should be seen urgently or emergently for more serious causes of headache.  Try Meclizine over the counter if vertigo. Follow up with your PCP and neurologist.

## 2022-06-19 IMAGING — MR MR CERVICAL SPINE W/O CM
5 series · 37 of 48 positions shown · non-contrast
Comparison: Report from cervical spine radiographs 02/15/2021
(images unavailable). Ultrasound of the neck soft tissues
02/19/2020. Neck CT 04/19/2018.

CLINICAL DATA: Cervicalgia. Dizziness. Additional history provided:
Patient reports posterior neck lump around back of skull, pain
radiating from left ear into spine and back into right ear.

EXAM:
MRI CERVICAL SPINE WITHOUT CONTRAST
TECHNIQUE: Multiplanar, multisequence MR imaging of the cervical spine was
performed. No intravenous contrast was administered.

[Series 13: T2 · sagittal · 3.0mm · 0.69mm/px · 7 of 13 slices shown (1 of 2)]
[im 1/13]
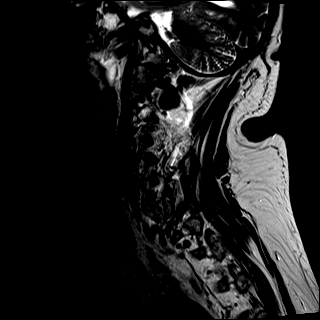
[im 3/13]
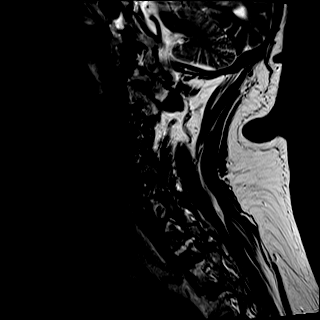
[im 5/13]
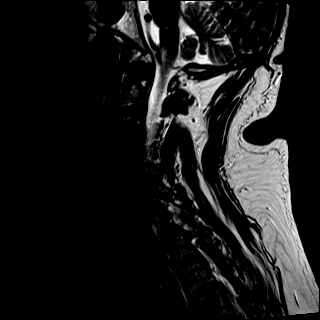
[im 7/13]
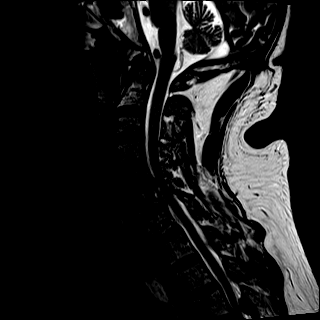
[im 9/13]
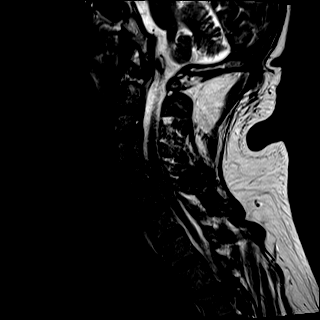
[im 11/13]
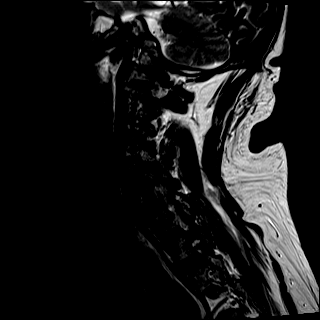
[im 13/13]
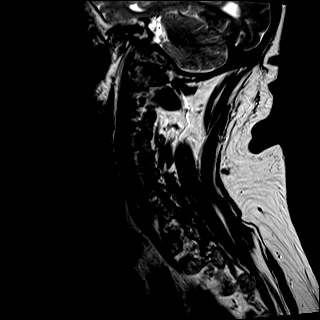

[Series 14: T1 · sagittal · 3.0mm · 0.86mm/px · 7 of 13 slices shown]
[im 1/13]
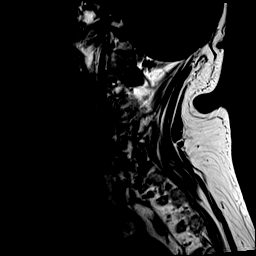
[im 3/13]
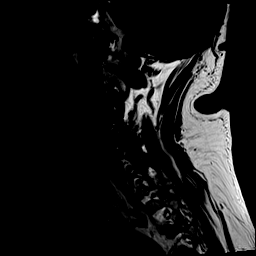
[im 5/13]
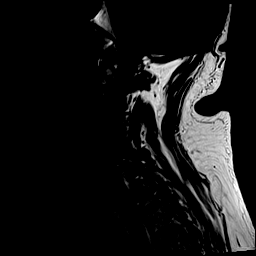
[im 7/13]
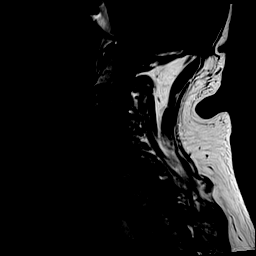
[im 9/13]
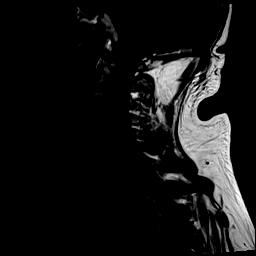
[im 11/13]
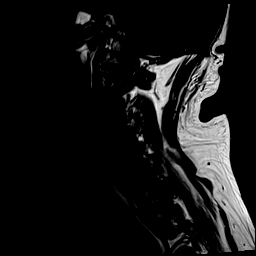
[im 13/13]
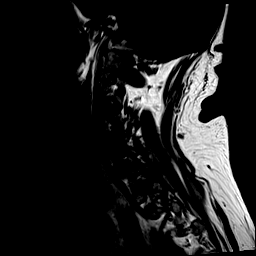

[Series 15: STIR · sagittal · 3.0mm · 0.69mm/px · 6 of 13 slices shown]
[im 1/13]
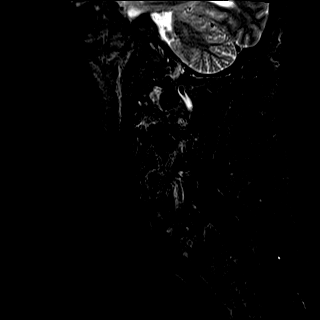
[im 3/13]
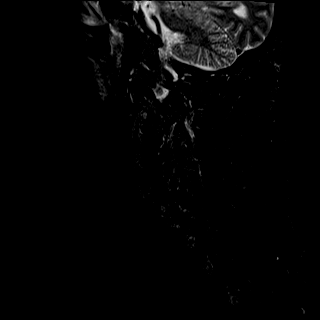
[im 5/13]
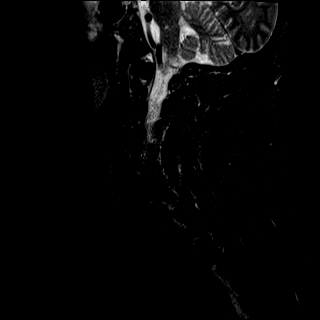
[im 8/13]
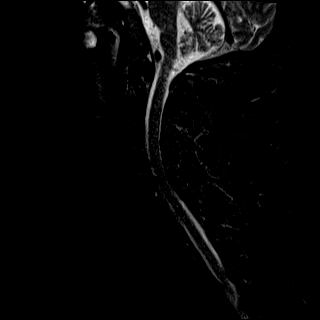
[im 10/13]
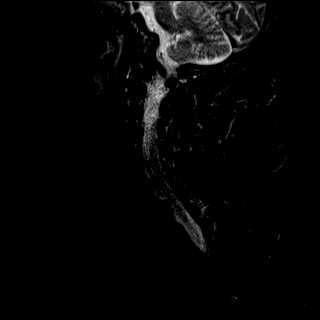
[im 13/13]
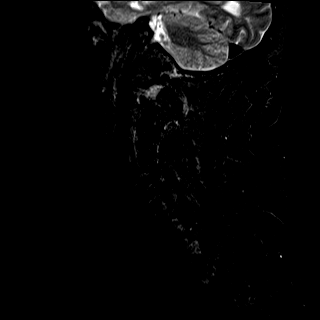

[Series 16: T2 · axial · 3.0mm · 0.62mm/px · z∈[-205,-103]mm · 9 of 29 slices shown (2 of 2)]
[im 1/29]
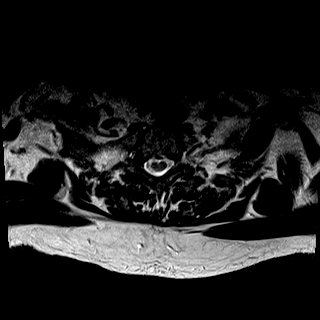
[im 3/29]
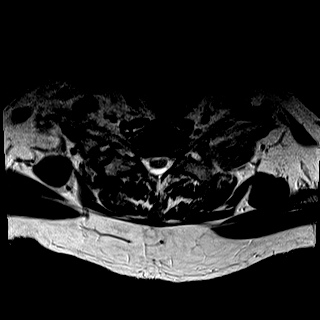
[im 5/29]
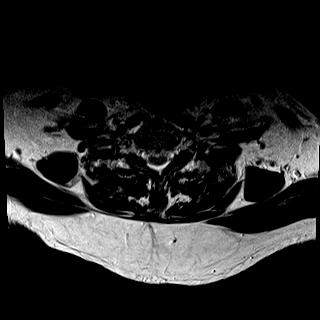
[im 9/29]
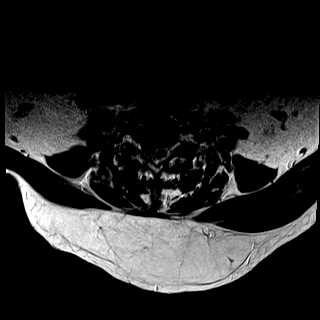
[im 13/29]
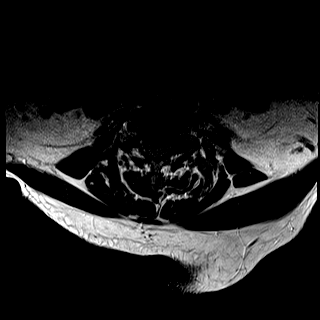
[im 16/29]
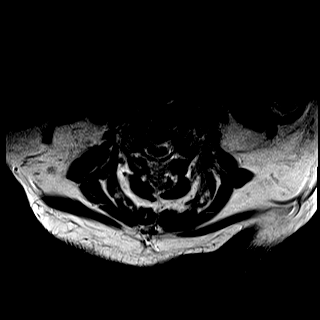
[im 20/29]
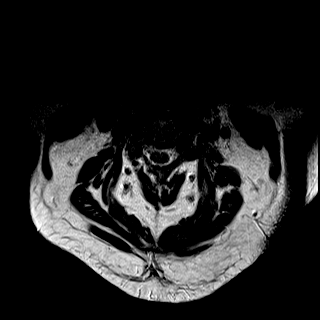
[im 24/29]
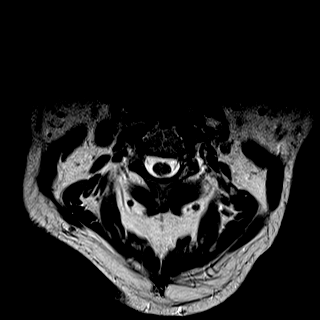
[im 29/29]
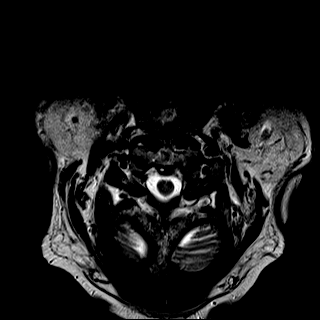

[Series 17: mpgr ax · axial · 3.0mm · 0.35mm/px · z∈[-194,-92]mm · 8 of 29 slices shown]
[im 1/29]
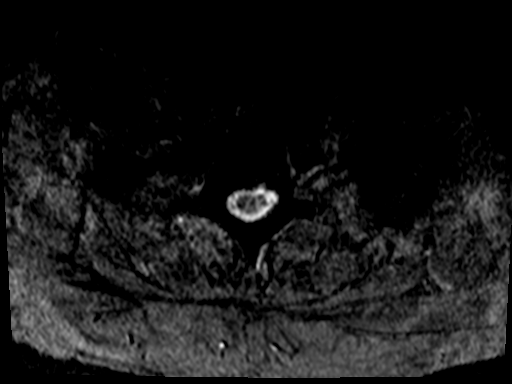
[im 5/29]
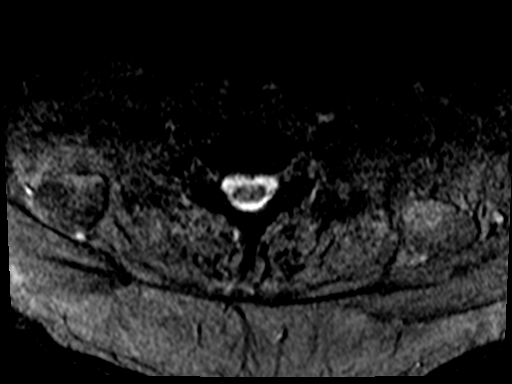
[im 9/29]
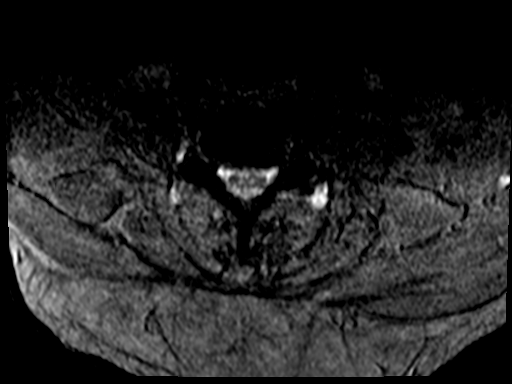
[im 13/29]
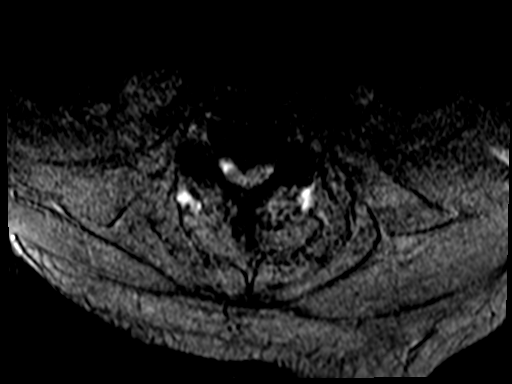
[im 16/29]
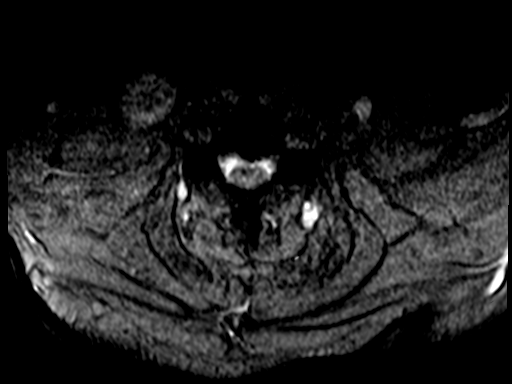
[im 20/29]
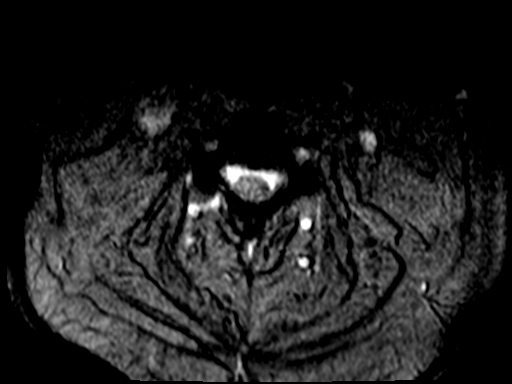
[im 24/29]
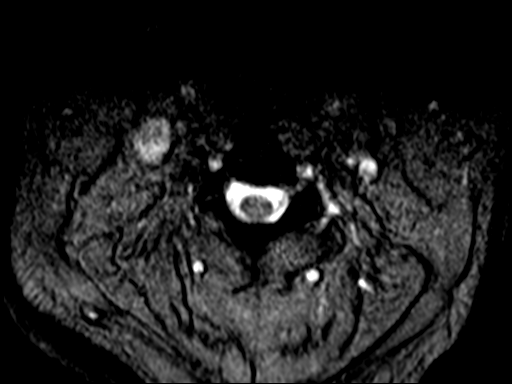
[im 29/29]
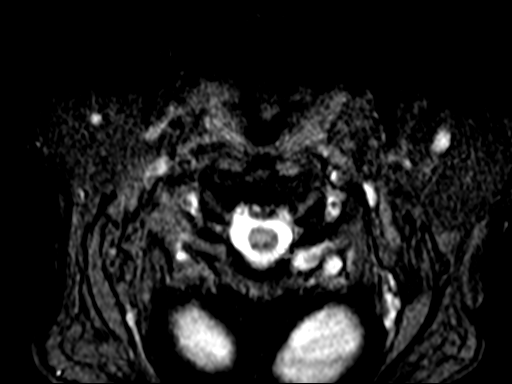

[37 of 48 positions shown; findings below may reference images not displayed]

FINDINGS: Mild-to-moderate intermittent motion degradation.

Alignment: No significant spondylolisthesis.

Vertebrae: Vertebral body height is maintained. Within the
limitations of motion degradation, no significant marrow edema or
focal suspicious osseous lesion is identified.

Cord: Within the limitations of motion degradation, no definite
signal abnormality is identified within the cervical spinal cord.
Spinal cord flattening at C5-C6 as described below.

Posterior Fossa, vertebral arteries, paraspinal tissues: Posterior
fossa better assessed on same-day brain MRI. Flow voids preserved
within the imaged cervical vertebral arteries. Paraspinal soft
tissues unremarkable.

Disc levels:

No more than mild disc degeneration within the cervical spine.

C2-C3: No significant disc herniation or spinal canal stenosis.
Uncovertebral hypertrophy on the left. Mild relative left neural
foraminal narrowing.

C3-C4: Disc bulge with bilateral uncovertebral hypertrophy no
significant spinal canal stenosis. Bilateral neural foraminal
narrowing (mild right, moderate left).

C4-C5: Disc bulge with superimposed small central disc protrusion.
Mild facet arthrosis. Minimal partial effacement of the ventral
thecal sac (without spinal cord mass effect). No significant
foraminal narrowing.

C5-C6: Disc bulge. Superimposed moderate-sized central disc
protrusion eccentric to the right. Uncovertebral hypertrophy
(greater on the left). Facet arthrosis. Severe spinal canal stenosis
with moderate spinal cord flattening. No definite spinal cord signal
abnormality is identified, although motion degradation limits
evaluation. Bilateral neural foraminal narrowing (moderate right,
severe left).

C6-C7: Disc bulge. Superimposed small right center disc protrusion.
Bilateral uncovertebral hypertrophy. The disc protrusion results in
mild focal effacement of the ventral thecal sac, contacting the
right ventral aspect of the spinal cord. Bilateral neural foraminal
narrowing (moderate right, mild left).

C7-T1: Slight disc bulge. No significant spinal canal or foraminal
stenosis.
IMPRESSION: Cervical spondylosis, as outlined and with findings most notably as
follows.

At C5-C6, a moderate-sized central disc protrusion contributes to
multifactorial severe spinal canal stenosis with moderate spinal
cord flattening. No definite spinal cord signal abnormality is
identified, although motion degradation limits evaluation.
Multifactorial bilateral neural foraminal narrowing (moderate right,
severe left).

No more than mild relative spinal canal narrowing at the remaining
levels. Of note, a right center disc protrusion contacts the ventral
spinal cord at C6-C7.

Additional sites of foraminal stenosis, as detailed and greatest on
the left at C3-C4 (moderate) and on the right at C6-C7 (moderate).

## 2022-08-09 ENCOUNTER — Encounter: Payer: Self-pay | Admitting: Neurosurgery

## 2022-08-09 ENCOUNTER — Ambulatory Visit (INDEPENDENT_AMBULATORY_CARE_PROVIDER_SITE_OTHER): Payer: Medicare Other | Admitting: Neurosurgery

## 2022-08-09 VITALS — BP 150/78 | Wt 220.4 lb

## 2022-08-09 DIAGNOSIS — M4802 Spinal stenosis, cervical region: Secondary | ICD-10-CM | POA: Diagnosis not present

## 2022-08-09 NOTE — Progress Notes (Signed)
Progress Note: Referring Physician:  Lauro Regulus, MD [address] [facility] [place],  [place] [address]  Primary Physician:  Lauro Regulus, MD  Chief Complaint:  f/u of cervical myelopathy   History of Present Illness: 08/09/2022 Bobby Chen presents today for reevaluation.  He might have some slight increase in symptoms, but he is overall stable.  His most pressing concern is getting access to his diabetes medication.  03/10/2022 He continues to have relatively stable findings.  He has had multiple falls over the past several months.  Most of these are mechanical.  He has intermittent numbness or tingling in his hands  12/02/2021 Bobby Chen returns to clinic.  He is having some issues with imbalance.  He was having vertigo, but he underwent Epley maneuvers at physical therapy office and feels better now.  He has no other symptoms of myelopathy.  10/28/21 from [facility] note: Bobby Chen is a 77 y.o. male who presents for 3 month follow up of cervical stenosis and watch for symptoms of cervical myelopathy.  He was seen in early April and was doing fairly well at that time however he suffered a fall shortly after and has had increased right scapular pain worse with reaching forward and overhead he describes as sharp in nature.  In by his PCP and x-rays were obtained which were largely normal however his symptoms have persisted.  He also states that he has had more difficulty getting up from a seated position since this fall.  He states that he has had an increased number of falls over the last year but attributes this to his blood pressure and a recent diagnosis of vertigo.  He is currently scheduled to start physical therapy next week.  He denies any new or worsening radicular arm symptoms.  He does have a history of carpal tunnel and has constant numbness and tingling in his hands although states this has worsened since his fall in April.  LOV:   07/27/21 Bobby Chen is a 77 y.o. male with known cervical stenosis and neck who presents for 6 week follow up after PT. he reports increased range of motion in his neck however he does continue to have some right-sided neck pain and interscapular pain when rotating his head to the right. Physical therapy is working on this. He denies any radiating arm symptoms, numbness or tingling into his hands, changes in dexterity, or weakness.  HPI by Dr. Myer Haff: 05/31/2021 Bobby Chen is here today with a chief complaint of neck pain/stiffness and limited ROM. He also reports pain that radiates down the middle of his upper back and into his ears bilaterally. He denies numbness, tingling, or trouble with dexterity. He reports he has had balance issues since breaking his right ankle in 1984.   He has been having issues since August 2022. He has pain towards the top of his neck and has difficulty with rotating to the left.    Bowel/Bladder Dysfunction: none   Conservative measures:  Physical therapy: has not participated Multimodal medical therapy including regular antiinflammatories:  Robaxin, Lyrica, Ultram Injections: has not received epidural steroid injections   Past Surgery: none   Bobby Chen has no symptoms of cervical myelopathy.   The symptoms are causing a significant impact on the patient's life.    HPI by Manning Charity, PA: 04/28/2021: Telephone call: "I spoke with Bobby Chen at length regarding his MRI results and concern for stenosis at  C5-6. His primary complaint continues to be stiffness and neck pain with limited ROM to the left. He continues to be without any numbness, tingling, imbalance, or trouble with dexterity.  We did discuss further options regarding his cervical stenosis and I expressed that this was unlikely the cause of his neck pain but could manifest as myelopathy in the future. We discussed the symptoms of cervical myelopathy.  We discussed treatment options such as  PT which his insurance still will not cover according to him, and injections which he is not interested in. He would like to meet with Dr. Myer Haff about his cervical stenosis and discuss surgical options.  He was encouraged to call the office in the interim with any questions or concerns." 04/01/2021 Bobby Chen is a 77 y.o with a history of diabetes, hypertension, chronic pain, here today at the request of ENT for further evaluation of his neck.  He states that he has had neck pain that radiates down the middle of his upper back and into his ears bilaterally since August of this year without any inciting event.  He was recently seen by ENT who referred him to neurosurgery for further evaluation of "pitched nerves" in his cervical spine.  He denies any radiating arm pain.  He states that turning his head from side to side particularly when driving is more difficult and will occasionally cause the pain in his neck.  He also endorses some pain with looking up and down.  His primary complaint however is dizziness which seems to occur somewhat reasonably as well as ringing in his ears.  He underwent physical therapy for his right lower extremity and was referred to physical therapy for his neck and for gait training however his insurance not cover anymore sessions this year.  He denies any upper extremity weakness.  Of note he endorses several episodes of dried blood in his left ear.  He denies any trauma or using any penetrating objects.  Exam: Vitals:   08/09/22 1004  BP: (!) 162/80     NEUROLOGICAL:  General: In no acute distress.   Awake, alert, oriented to person, place, and time.  Pupils equal round and reactive to light.  Facial tone is symmetric.  Tongue protrusion is midline.  There is no pronator drift.  ROM of spine: full.  Palpation of spine: nontender.    Strength: Side Biceps Triceps Deltoid Interossei Grip Wrist Ext. Wrist Flex.  R L No  hyperreflexia noted.  Imaging: 04/15/21 MRI C spine IMPRESSION: Cervical spondylosis, as outlined and with findings most notably as follows.   At C5-C6, a moderate-sized central disc protrusion contributes to multifactorial severe spinal canal stenosis with moderate spinal cord flattening. No definite spinal cord signal abnormality is identified, although motion degradation limits evaluation. Multifactorial bilateral neural foraminal narrowing (moderate right, severe left).   No more than mild relative spinal canal narrowing at the remaining levels. Of note, a right center disc protrusion contacts the ventral spinal cord at C6-C7.   Additional sites of foraminal stenosis, as detailed and greatest on the left at C3-C4 (moderate) and on the right at C6-C7 (moderate).     Electronically Signed   By: Jackey Loge D.O.   On: 04/15/2021 12:34  MRI C spine 11/26/21 IMPRESSION: 1. Overall no significant interval change since 04/15/2021. No abnormal enhancement. 2. Stable degenerative changes most advanced at C5-C6 where there  is unchanged severe spinal canal stenosis and severe left and moderate right neural foraminal stenosis. No definite cord signal abnormality. 3. Unchanged moderate to severe left neural foraminal stenosis at C3-C4.     Electronically Signed   By: Lesia Hausen M.D.   On: 11/26/2021 13:44  MRI T spine 11/13/21 IMPRESSION: 1. Prominent spondylitic spurring and ligamentum flavum thickening at T3-4 to T9-10. Nodular thickening of the ligamentum flavum flattens the cord from behind at T3-4 and T9-10. No convincing cord signal abnormality. 2. Diffusely patent foramina.     Electronically Signed   By: Tiburcio Pea M.D.   On: 11/14/2021 05:58 I have personally reviewed the images and agree with the above interpretation.  Assessment and Plan: Bobby Chen is a pleasant 77 y.o. male with known cervical stenosis.   I do not feel that he is overtly myelopathic  at this time.  I have recommended continue to watch his symptoms.  I will see him back in 2-3 months to monitor his symptomatology.   I spent a total of 15 minutes in both face-to-face and non-face-to-face activities for this visit on the date of this encounter.   Venetia Night MD Neurosurgery

## 2022-08-19 ENCOUNTER — Ambulatory Visit
Admission: EM | Admit: 2022-08-19 | Discharge: 2022-08-19 | Disposition: A | Discharge status: Home / Self Care | Payer: Medicare Other | Attending: Physician Assistant | Admitting: Physician Assistant

## 2022-08-19 DIAGNOSIS — S91209A Unspecified open wound of unspecified toe(s) with damage to nail, initial encounter: Secondary | ICD-10-CM

## 2022-08-19 MED ORDER — CEFDINIR 300 MG PO CAPS: 300.0000 mg | ORAL_CAPSULE | Freq: Two times a day (BID) | ORAL | 0 refills | Status: AC

## 2022-08-19 MED ORDER — MUPIROCIN 2 % EX OINT: 1.0000 | TOPICAL_OINTMENT | Freq: Two times a day (BID) | CUTANEOUS | 0 refills | Status: DC

## 2022-08-19 NOTE — ED Provider Notes (Signed)
[facility]    CSN: [id number] Arrival date & time: 08/19/22  1329      History   Chief Complaint Chief Complaint  Patient presents with   Nail Problem    HPI Bobby Chen is a 77 y.o. male with history of type 2 diabetes, hypertension, obesity, sleep apnea, COPD, skin cancers, peripheral vascular disease and hyperlipidemia.  Today patient is presenting for injury to the right second toe.  He says that he was getting out of bed and his covers wrapped around his toenail and pulled it mostly off.  He says that he pulled to the rest of the way off.  He reports that he has had bleeding ever since.  This was about 12 hours ago.  He has cleaned the toe.  He believes he is up-to-date with his tetanus immunization.  He says the pain has improved and the bleeding has slowed down a little.  No other concerns.  HPI  Past Medical History:  Diagnosis Date   Anginal pain (HCC)    Brown recluse spider bite    Cancer (HCC)    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, LEFT ARM    COPD (chronic obstructive pulmonary disease) (HCC)    Diabetes mellitus without complication (HCC)    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HCC)    Elevated lipids    Hypertension    Obesity    Pulmonary arterial hypertension (HCC)    Sleep apnea     Patient Active Problem List   Diagnosis Date Noted   Pain in joint, ankle and foot 10/19/2021   Lymphedema 10/19/2021   Spinal stenosis of cervical region 08/26/2021   Bilateral hearing loss 04/10/2020   Difficulty walking 04/10/2020   Sensory ataxia 04/10/2020   B12 deficiency 02/03/2020   Leg pain, right 08/26/2019   Chronic venous insufficiency 08/26/2019   Venous insufficiency 08/26/2019   PAD (peripheral artery disease) (HCC) 08/14/2019   PVD (peripheral vascular disease) (HCC) 08/14/2019   Obstructive sleep apnea 05/06/2019   Obesity, Class II, BMI 35-39.9 03/11/2019   Type 2 diabetes mellitus with stage 3a chronic kidney disease, without long-term current use of insulin (HCC)  03/11/2019   Pain in joint involving ankle and foot 11/12/2018   Nuclear sclerotic cataract of both eyes 08/30/2018   Abnormal sensation in left ear 08/20/2018   Dizziness 08/20/2018   Numbness and tingling of left side of face 08/20/2018   Essential hypertension with goal blood pressure less than 130/80 03/19/2018   Healthcare maintenance 08/07/2017   Brown recluse spider bite 05/08/2017   SCC (squamous cell carcinoma), 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/2015   Severe obesity (BMI 35.0-35.9 with comorbidity) (HCC) 06/01/2015   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   Hyperlipidemia associated with type 2 diabetes mellitus (HCC) 09/29/2013    Past Surgical History:  Procedure Laterality Date   ANKLE FRACTURE SURGERY     BASAL CELL CARCINOMA EXCISION N/A    scalp   COLONOSCOPY W/ POLYPECTOMY     COLONOSCOPY WITH PROPOFOL N/A 01/18/2016   Procedure: COLONOSCOPY WITH PROPOFOL;  Surgeon: Christena Deem, MD;  Location: [facility] ENDOSCOPY;  Service: Endoscopy;  Laterality: N/A;   COLONOSCOPY WITH PROPOFOL N/A 07/02/2018   Procedure: COLONOSCOPY WITH PROPOFOL;  Surgeon: Christena Deem, MD;  Location: [facility] ENDOSCOPY;  Service: Endoscopy;  Laterality: N/A;   ESOPHAGOGASTRODUODENOSCOPY (EGD) WITH PROPOFOL N/A 01/18/2016   Procedure: ESOPHAGOGASTRODUODENOSCOPY (EGD) WITH PROPOFOL;  Surgeon: Christena Deem, MD;  Location: [facility] ENDOSCOPY;  Service: Endoscopy;  Laterality: N/A;   FRACTURE SURGERY Right 1984  ankle   HERNIA REPAIR     UMBILICAL   LEG SURGERY Left    NASAL SEPTOPLASTY W/ TURBINOPLASTY Bilateral 11/06/2019   Procedure: NASAL SEPTOPLASTY WITH INFERIOR TURBINATE REDUCTION;  Surgeon: Vernie Murders, MD;  Location: [facility] ORS;  Service: ENT;  Laterality: Bilateral;   toenail removal Bilateral        Home Medications    Prior to Admission medications   Medication Sig Start Date End Date Taking? Authorizing Provider  acetaminophen (TYLENOL) 500 MG tablet Take 500 mg by mouth every 6 (six) hours as  needed.   Yes [provider]  albuterol (PROVENTIL HFA;VENTOLIN HFA) 108 (90 Base) MCG/ACT inhaler Inhale 2 puffs into the lungs every 4 (four) hours as needed for wheezing or shortness of breath.   Yes [provider]  atorvastatin (LIPITOR) 40 MG tablet Take 1 tablet by mouth at bedtime. 07/04/22  Yes [provider]  Bioflavonoid Products (ESTER C PO) Take 500 mg by mouth daily.   Yes [provider]  brimonidine-timolol (COMBIGAN) 0.2-0.5 % ophthalmic solution Place 1 drop into both eyes every 12 (twelve) hours. 01/29/22  Yes [provider]  chlorhexidine (PERIDEX) 0.12 % solution SMARTSIG:By Mouth 07/01/21  Yes [provider]  citalopram (CELEXA) 10 MG tablet Take 1 tablet by mouth daily. 11/06/20  Yes [provider]  cyclobenzaprine (FLEXERIL) 10 MG tablet Take 10 mg by mouth in the morning, at noon, and at bedtime.   Yes [provider]  Dulaglutide (TRULICITY) 3 MG/0.5ML SOPN Inject into the skin once a week.   Yes [provider]  empagliflozin (JARDIANCE) 10 MG TABS tablet Take 1 tablet by mouth daily. 02/25/22 02/25/23 Yes [provider]  fluticasone (FLONASE) 50 MCG/ACT nasal spray Place 2 sprays into both nostrils daily. 05/31/18  Yes Lutricia Feil, PA-C  losartan (COZAAR) 100 MG tablet Take 100 mg by mouth daily. 11/10/21 11/10/22 Yes [provider]  metFORMIN (GLUCOPHAGE-XR) 500 MG 24 hr tablet Take 1,000 mg by mouth 2 (two) times daily.    Yes [provider]  montelukast (SINGULAIR) 10 MG tablet  12/10/20  Yes [provider]  nortriptyline (PAMELOR) 10 MG capsule Take 50 mg by mouth. 08/18/21  Yes [provider]  pantoprazole (PROTONIX) 40 MG tablet Take 40 mg by mouth daily as needed (heartburn).  01/09/17  Yes [provider]  pregabalin (LYRICA) 50 MG capsule Take 50 mg by mouth 2 (two) times daily. 08/18/21  Yes [provider]  traMADol  (ULTRAM) 50 MG tablet Take 1 tablet (50 mg total) by mouth every 6 (six) hours as needed. 03/13/21  Yes Maple Mirza L, NP  VITAMIN A PO Take 1 tablet by mouth daily.   Yes [provider]  budesonide (PULMICORT FLEXHALER) 180 MCG/ACT inhaler Inhale into the lungs. 12/29/20 04/20/22  [provider]  cefdinir (OMNICEF) 300 MG capsule Take 1 capsule (300 mg total) by mouth 2 (two) times daily for 7 days. 08/19/22 08/26/22 Yes Shirlee Latch, PA-C  mupirocin ointment (BACTROBAN) 2 % Apply 1 Application topically 2 (two) times daily. 08/19/22  Yes Shirlee Latch, PA-C    Family History Family History  Problem Relation Age of Onset   Ovarian cancer Mother    Ulcers Father     Social History Social History   Tobacco Use   Smoking status: Never   Smokeless tobacco: Never  Vaping Use   Vaping Use: Never used  Substance Use Topics   Alcohol  use: No   Drug use: No     Allergies   Amoxil [amoxicillin], Aspirin buf(cacarb-mgcarb-mgo), and Aspirin buffered   Review of Systems Review of Systems  Musculoskeletal:  Negative for arthralgias, gait problem and joint swelling.  Skin:  Positive for wound. Negative for color change.  Neurological:  Negative for weakness and numbness.     Physical Exam Triage Vital Signs ED Triage Vitals  Enc Vitals Group     BP 08/19/22 1344 (!) 151/78     Pulse Rate 08/19/22 1344 70     Resp --      Temp 08/19/22 1344 98.3 F (36.8 C)     Temp Source 08/19/22 1344 Oral     SpO2 08/19/22 1344 94 %     Weight 08/19/22 1337 211 lb (95.7 kg)     Height 08/19/22 1337 5\' 7"  (1.702 m)     Head Circumference --      Peak Flow --      Pain Score 08/19/22 1337 3     Pain Loc --      Pain Edu? --      Excl. in GC? --    No data found.  Updated Vital Signs BP (!) 151/78 (BP Location: Left Arm)   Pulse 70   Temp 98.3 F (36.8 C) (Oral)   Ht 5\' 7"  (1.702 m)   Wt 211 lb (95.7 kg)   SpO2 94%   BMI 33.05 kg/m    Physical  Exam Vitals and nursing note reviewed.  Constitutional:      General: He is not in acute distress.    Appearance: Normal appearance. He is well-developed. He is not ill-appearing.  HENT:     Head: Normocephalic and atraumatic.  Eyes:     General: No scleral icterus.    Conjunctiva/sclera: Conjunctivae normal.  Cardiovascular:     Rate and Rhythm: Normal rate and regular rhythm.     Pulses: Normal pulses.     Heart sounds: No murmur heard. Pulmonary:     Effort: Pulmonary effort is normal. No respiratory distress.  Musculoskeletal:     Cervical back: Neck supple.     Comments: Right second toe: Toenail is completely absent.  Nailbed is tender to palpation and mild erythema to distal toe.  Full range of motion.  Skin:    General: Skin is warm and dry.     Capillary Refill: Capillary refill takes less than 2 seconds.  Neurological:     General: No focal deficit present.     Mental Status: He is alert. Mental status is at baseline.     Motor: No weakness.     Gait: Gait normal.  Psychiatric:        Mood and Affect: Mood normal.        Behavior: Behavior normal.      UC Treatments / Results  Labs (all labs ordered are listed, but only abnormal results are displayed) Labs Reviewed - No data to display  EKG   Radiology No results found.  Procedures Procedures (including critical care time)  Medications Ordered in UC Medications - No data to display  Initial Impression / Assessment and Plan / UC Course  I have reviewed the triage vital signs and the nursing notes.  Pertinent labs & imaging results that were available during my care of the patient were reviewed by me and considered in my medical decision making (see chart for details).   77 year old male presents for toenail injury that  occurred about 12 hours ago.  He pulled his toenail off.  Reports some discomfort and bleeding.  On exam the toenail is completely absent and the distal toe is slightly erythematous with  a tender nailbed.  Area cleaned with chlorhexidine and saline soak.  Dried well then applied bacitracin and covered with nonstick pad and Coban.  Tetanus immunization verified to be in October 2021.  Advised patient of good wound care.  Explained to him that it is imperative to clean the area well and change bandages regularly.  The nail should grow back.  Will treat him with antibiotic (cefdinir) prophylactically since he is a diabetic.  Advised him to take Tylenol as needed for any pain and elevate the extremity.  Reviewed returning for any signs of infection.   Final Clinical Impressions(s) / UC Diagnoses   Final diagnoses:  Avulsion of toenail, initial encounter     Discharge Instructions      -Clean the area with soap and water every day and bandaged the toe.  Change bandage regularly. - Apply the mupirocin ointment twice a day as instructed. - I sent an oral antibiotic to try to prevent infection.  However, if you notice increased swelling, redness, pustular drainage, worsening pain, fever, red streak up the foot you should return or go to ER for reevaluation for potential infection. - You can take Tylenol as needed for pain and use ice and elevate the extremity.     ED Prescriptions     Medication Sig Dispense Auth. Provider   cefdinir (OMNICEF) 300 MG capsule Take 1 capsule (300 mg total) by mouth 2 (two) times daily for 7 days. 14 capsule Eusebio Friendly B, PA-C   mupirocin ointment (BACTROBAN) 2 % Apply 1 Application topically 2 (two) times daily. 22 g Shirlee Latch, PA-C      PDMP not reviewed this encounter.   Shirlee Latch, PA-C 08/19/22 1435

## 2022-08-19 NOTE — Discharge Instructions (Addendum)
-  Clean the area with soap and water every day and bandaged the toe.  Change bandage regularly. - Apply the mupirocin ointment twice a day as instructed. - I sent an oral antibiotic to try to prevent infection.  However, if you notice increased swelling, redness, pustular drainage, worsening pain, fever, red streak up the foot you should return or go to ER for reevaluation for potential infection. - You can take Tylenol as needed for pain and use ice and elevate the extremity.

## 2022-08-19 NOTE — ED Triage Notes (Signed)
Pt c/o right 2nd toe injury.  Pt was getting out of bed when his covers wrapped around his toenail and ripped his toenail off at 1am.

## 2022-08-24 ENCOUNTER — Ambulatory Visit: Payer: Medicare Other

## 2022-08-24 ENCOUNTER — Ambulatory Visit
Admission: EM | Admit: 2022-08-24 | Discharge: 2022-08-24 | Disposition: A | Discharge status: Home / Self Care | Payer: Medicare Other | Attending: Family Medicine | Admitting: Family Medicine

## 2022-08-24 ENCOUNTER — Ambulatory Visit (INDEPENDENT_AMBULATORY_CARE_PROVIDER_SITE_OTHER): Payer: Medicare Other

## 2022-08-24 DIAGNOSIS — M79674 Pain in right toe(s): Secondary | ICD-10-CM | POA: Diagnosis not present

## 2022-08-24 DIAGNOSIS — L03818 Cellulitis of other sites: Secondary | ICD-10-CM | POA: Diagnosis not present

## 2022-08-24 MED ORDER — DOXYCYCLINE HYCLATE 100 MG PO CAPS: 100.0000 mg | ORAL_CAPSULE | Freq: Two times a day (BID) | ORAL | 0 refills | Status: DC

## 2022-08-24 MED ORDER — ACETAMINOPHEN 325 MG PO TABS
975.0000 mg | ORAL_TABLET | Freq: Once | ORAL | Status: AC
  Administered 2022-08-24: 975 mg via ORAL

## 2022-08-24 NOTE — ED Triage Notes (Signed)
Pt c/o continued pain along 2nd right toe. Pt states that the inside of his toes are hurting between his 1st and 2nd toe. Pt states that his toe is Pink and Swollen.   Pt states that he has been soaking his foot at night and has 4 more doses of his antibiotic left -Omnicef.  Pt took a tylenol at 12:30am and pain stopped around 2am.

## 2022-08-24 NOTE — ED Provider Notes (Addendum)
[facility]    CSN: [id number] Arrival date & time: 08/24/22  1108      History   Chief Complaint Chief Complaint  Patient presents with   Toe Injury    HPI  HPI Bobby Chen is a 77 y.o. male.   Bobby Chen presents for toe pain that started after he pulled his toenail off by accident.  He had bleeding on the floor.  He has been having pain with movement of his first and second toe on the right. The toe is now red and swollen. He is applying a salve and taking the antibiotics previously prescribed to his earlier urgent care visit.     Past Medical History:  Diagnosis Date   Anginal pain (HCC)    Brown recluse spider bite    Cancer (HCC)    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, LEFT ARM    COPD (chronic obstructive pulmonary disease) (HCC)    Diabetes mellitus without complication (HCC)    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HCC)    Elevated lipids    Hypertension    Obesity    Pulmonary arterial hypertension (HCC)    Sleep apnea     Patient Active Problem List   Diagnosis Date Noted   Pain in joint, ankle and foot 10/19/2021   Lymphedema 10/19/2021   Spinal stenosis of cervical region 08/26/2021   Bilateral hearing loss 04/10/2020   Difficulty walking 04/10/2020   Sensory ataxia 04/10/2020   B12 deficiency 02/03/2020   Leg pain, right 08/26/2019   Chronic venous insufficiency 08/26/2019   Venous insufficiency 08/26/2019   PAD (peripheral artery disease) (HCC) 08/14/2019   PVD (peripheral vascular disease) (HCC) 08/14/2019   Obstructive sleep apnea 05/06/2019   Obesity, Class II, BMI 35-39.9 03/11/2019   Type 2 diabetes mellitus with stage 3a chronic kidney disease, without long-term current use of insulin (HCC) 03/11/2019   Pain in joint involving ankle and foot 11/12/2018   Nuclear sclerotic cataract of both eyes 08/30/2018   Abnormal sensation in left ear 08/20/2018   Dizziness 08/20/2018   Numbness and tingling of left side of face 08/20/2018   Essential hypertension with goal  blood pressure less than 130/80 03/19/2018   Healthcare maintenance 08/07/2017   Brown recluse spider bite 05/08/2017   SCC (squamous cell carcinoma), 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/2015   Severe obesity (BMI 35.0-35.9 with comorbidity) (HCC) 06/01/2015   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   Hyperlipidemia associated with type 2 diabetes mellitus (HCC) 09/29/2013    Past Surgical History:  Procedure Laterality Date   ANKLE FRACTURE SURGERY     BASAL CELL CARCINOMA EXCISION N/A    scalp   COLONOSCOPY W/ POLYPECTOMY     COLONOSCOPY WITH PROPOFOL N/A 01/18/2016   Procedure: COLONOSCOPY WITH PROPOFOL;  Surgeon: Christena Deem, MD;  Location: [facility] ENDOSCOPY;  Service: Endoscopy;  Laterality: N/A;   COLONOSCOPY WITH PROPOFOL N/A 07/02/2018   Procedure: COLONOSCOPY WITH PROPOFOL;  Surgeon: Christena Deem, MD;  Location: [facility] ENDOSCOPY;  Service: Endoscopy;  Laterality: N/A;   ESOPHAGOGASTRODUODENOSCOPY (EGD) WITH PROPOFOL N/A 01/18/2016   Procedure: ESOPHAGOGASTRODUODENOSCOPY (EGD) WITH PROPOFOL;  Surgeon: Christena Deem, MD;  Location[facility] ENDOSCOPY;  Service: Endoscopy;  Laterality: N/A;   FRACTURE SURGERY Right 1984   ankle   HERNIA REPAIR     UMBILICAL   LEG SURGERY Left    NASAL SEPTOPLASTY W/ TURBINOPLASTY Bilateral 11/06/2019   Procedure: NASAL SEPTOPLASTY WITH INFERIOR TURBINATE REDUCTION;  Surgeon: Vernie Murders, MD;  Location: [facility] ORS;  Service: ENT;  Laterality:  Bilateral;   toenail removal Bilateral        Home Medications    Prior to Admission medications   Medication Sig Start Date End Date Taking? Authorizing Provider  acetaminophen (TYLENOL) 500 MG tablet Take 500 mg by mouth every 6 (six) hours as needed.   Yes [provider]  albuterol (PROVENTIL HFA;VENTOLIN HFA) 108 (90 Base) MCG/ACT inhaler Inhale 2 puffs into the lungs every 4 (four) hours as needed for wheezing or shortness of breath.   Yes [provider]  atorvastatin (LIPITOR) 40 MG tablet Take 1  tablet by mouth at bedtime. 07/04/22  Yes [provider]  Bioflavonoid Products (ESTER C PO) Take 500 mg by mouth daily.   Yes [provider]  brimonidine-timolol (COMBIGAN) 0.2-0.5 % ophthalmic solution Place 1 drop into both eyes every 12 (twelve) hours. 01/29/22  Yes [provider]  cefdinir (OMNICEF) 300 MG capsule Take 1 capsule (300 mg total) by mouth 2 (two) times daily for 7 days. 08/19/22 08/26/22 Yes Shirlee Latch, PA-C  chlorhexidine (PERIDEX) 0.12 % solution SMARTSIG:By Mouth 07/01/21  Yes [provider]  citalopram (CELEXA) 10 MG tablet Take 1 tablet by mouth daily. 11/06/20  Yes [provider]  cyclobenzaprine (FLEXERIL) 10 MG tablet Take 10 mg by mouth in the morning, at noon, and at bedtime.   Yes [provider]  doxycycline (VIBRAMYCIN) 100 MG capsule Take 1 capsule (100 mg total) by mouth 2 (two) times daily. 08/24/22  Yes Irl Bodie, DO  Dulaglutide (TRULICITY) 3 MG/0.5ML SOPN Inject into the skin once a week.   Yes [provider]  empagliflozin (JARDIANCE) 10 MG TABS tablet Take 1 tablet by mouth daily. 02/25/22 02/25/23 Yes [provider]  fluticasone (FLONASE) 50 MCG/ACT nasal spray Place 2 sprays into both nostrils daily. 05/31/18  Yes Lutricia Feil, PA-C  losartan (COZAAR) 100 MG tablet Take 100 mg by mouth daily. 11/10/21 11/10/22 Yes [provider]  metFORMIN (GLUCOPHAGE-XR) 500 MG 24 hr tablet Take 1,000 mg by mouth 2 (two) times daily.    Yes [provider]  montelukast (SINGULAIR) 10 MG tablet  12/10/20  Yes [provider]  mupirocin ointment (BACTROBAN) 2 % Apply 1 Application topically 2 (two) times daily. 08/19/22  Yes Shirlee Latch, PA-C  nortriptyline (PAMELOR) 10 MG capsule Take 50 mg by mouth. 08/18/21  Yes [provider]  pantoprazole (PROTONIX) 40 MG tablet Take 40 mg by mouth daily as needed (heartburn).  01/09/17  Yes [provider]   pregabalin (LYRICA) 50 MG capsule Take 50 mg by mouth 2 (two) times daily. 08/18/21  Yes [provider]  traMADol (ULTRAM) 50 MG tablet Take 1 tablet (50 mg total) by mouth every 6 (six) hours as needed. 03/13/21  Yes Maple Mirza L, NP  VITAMIN A PO Take 1 tablet by mouth daily.   Yes [provider]  budesonide (PULMICORT FLEXHALER) 180 MCG/ACT inhaler Inhale into the lungs. 12/29/20 04/20/22  [provider]    Family History Family History  Problem Relation Age of Onset   Ovarian cancer Mother    Ulcers Father     Social History Social History   Tobacco Use   Smoking status: Never   Smokeless tobacco: Never  Vaping Use   Vaping Use: Never used  Substance Use Topics   Alcohol use: No   Drug use: No     Allergies   Amoxil [amoxicillin], Aspirin buf(cacarb-mgcarb-mgo), and Aspirin buffered  Review of Systems Review of Systems: :negative unless otherwise stated in HPI.      Physical Exam Triage Vital Signs ED Triage Vitals  Enc Vitals Group     BP 08/24/22 1127 (!) 160/85     Pulse Rate 08/24/22 1127 84     Resp --      Temp 08/24/22 1127 98 F (36.7 C)     Temp Source 08/24/22 1127 Oral     SpO2 08/24/22 1127 96 %     Weight 08/24/22 1125 211 lb (95.7 kg)     Height 08/24/22 1125 5\' 7"  (1.702 m)     Head Circumference --      Peak Flow --      Pain Score 08/24/22 1125 6     Pain Loc --      Pain Edu? --      Excl. in GC? --    No data found.  Updated Vital Signs BP (!) 160/85 (BP Location: Left Arm)   Pulse 84   Temp 98 F (36.7 C) (Oral)   Ht 5\' 7"  (1.702 m)   Wt 95.7 kg   SpO2 96%   BMI 33.05 kg/m   Visual Acuity Right Eye Distance:   Left Eye Distance:   Bilateral Distance:    Right Eye Near:   Left Eye Near:    Bilateral Near:     Physical Exam GEN: well appearing male in no acute distress  CVS: well perfused  RESP: speaking in full sentences without pause, no respiratory distress  MSK: Right foot:  Great toe non-tender to palpation, normal range of motion, second toe erythematous and mildly edematous, tenderness to palpation at the MTP and IP joint, absent toenail, yellow fluid expressed with palpation, onychomycosis of great toe bilaterally SKIN: warm, dry, see MSK above   UC Treatments / Results  Labs (all labs ordered are listed, but only abnormal results are displayed) Labs Reviewed - No data to display  EKG   Radiology DG Toe 2nd Right  Result Date: 08/24/2022 CLINICAL DATA:  Continued pain between first and second toes. Toe is pink and swollen. EXAM: RIGHT SECOND TOE; RIGHT GREAT TOE COMPARISON:  None Available. FINDINGS: Great toe: There is no acute fracture or dislocation. Bony alignment is normal. The joint spaces are preserved. There is no erosive or destructive change. The soft tissues are unremarkable. There is no soft tissue gas or radiopaque foreign body. Second toe: There is no acute fracture or dislocation. Bony alignment is normal. The joint spaces are preserved. There is no erosive or destructive change. The soft tissues are unremarkable there is no soft tissue gas or radiopaque foreign body. IMPRESSION: No acute finding in the great toe or second toe. Electronically Signed   By: Lesia Hausen M.D.   On: 08/24/2022 13:17   DG Toe Great Right  Result Date: 08/24/2022 CLINICAL DATA:  Continued pain between first and second toes. Toe is pink and swollen. EXAM: RIGHT SECOND TOE; RIGHT GREAT TOE COMPARISON:  None Available. FINDINGS: Great toe: There is no acute fracture or dislocation. Bony alignment is normal. The joint spaces are preserved. There is no erosive or destructive change. The soft tissues are unremarkable. There is no soft tissue gas or radiopaque foreign body. Second toe: There is no acute fracture or dislocation. Bony alignment is normal. The joint spaces are preserved. There is no erosive or destructive change. The soft tissues are unremarkable there is no soft  tissue gas or radiopaque foreign  body. IMPRESSION: No acute finding in the great toe or second toe. Electronically Signed   By: Lesia Hausen M.D.   On: 08/24/2022 13:17     Procedures Procedures (including critical care time)  Medications Ordered in UC Medications  acetaminophen (TYLENOL) tablet 975 mg (975 mg Oral Given 08/24/22 1243)    Initial Impression / Assessment and Plan / UC Course  I have reviewed the triage vital signs and the nursing notes.  Pertinent labs & imaging results that were available during my care of the patient were reviewed by me and considered in my medical decision making (see chart for details).      Pt is a 77 y.o.  male with toe pain after a toenail avulsion. Obtained right great toe and 2nd toe plain films.  Personally interpreted by me were unremarkable for fracture or dislocation. Radiologist report reviewed and additionally notes no bony erosion, gas or no soft tissue swelling. Given 975 mg of Tylenol for pain.   Patient to gradually return to normal activities, as tolerated and continue ordinary activities within the limits permitted by pain. Continue Tylenol PRN.  Recommended Voltaren gel for pain. Switch to doxy for MRSA coverage. Counseled patient on red flag symptoms and when to seek immediate care. Follow up with his podiatrist.   Patient to follow up with orthopedic provider, if symptoms do not improve with conservative treatment.  Return and ED precautions given. Understanding voiced. Discussed MDM, treatment plan and plan for follow-up with patient/parent who agrees with plan.   Final Clinical Impressions(s) / UC Diagnoses   Final diagnoses:  Toe pain, right  Cellulitis of other specified site     Discharge Instructions      You xray did not show any fractured or dislocation bones nor did if find any infections of the bones in your toes.  We will switch your antibiotic to doxycycline.  Apply some voltargen gel to your toe for additional  pain relief. Follow up with your podiatrist.        ED Prescriptions     Medication Sig Dispense Auth. Provider   doxycycline (VIBRAMYCIN) 100 MG capsule Take 1 capsule (100 mg total) by mouth 2 (two) times daily. 14 capsule Preciosa Bundrick, DO      PDMP not reviewed this encounter.       Katha Cabal, DO 08/24/22 1516

## 2022-08-24 NOTE — Discharge Instructions (Addendum)
You xray did not show any fractured or dislocation bones nor did if find any infections of the bones in your toes.  We will switch your antibiotic to doxycycline.  Apply some voltargen gel to your toe for additional pain relief. Follow up with your podiatrist.

## 2022-11-08 ENCOUNTER — Ambulatory Visit: Payer: Medicare Other | Admitting: Neurosurgery

## 2022-11-08 ENCOUNTER — Encounter: Payer: Self-pay | Admitting: Neurosurgery

## 2022-11-08 VITALS — BP 142/88 | Ht 67.0 in | Wt 212.0 lb

## 2022-11-08 DIAGNOSIS — M4802 Spinal stenosis, cervical region: Secondary | ICD-10-CM | POA: Diagnosis not present

## 2022-11-08 NOTE — Progress Notes (Signed)
Progress Note: Referring Physician:  No referring provider defined for this encounter.  Primary Physician:  Lauro Regulus, MD  Chief Complaint:  f/u of cervical myelopathy   History of Present Illness: 11/08/2022 He has no new symptoms today.  He continues to have some issues with his balance which are stable.  08/09/2022 Mr. Bobby Chen presents today for reevaluation.  He might have some slight increase in symptoms, but he is overall stable.  His most pressing concern is getting access to his diabetes medication.  03/10/2022 He continues to have relatively stable findings.  He has had multiple falls over the past several months.  Most of these are mechanical.  He has intermittent numbness or tingling in his Chen  12/02/2021 Mr. Wymer returns to clinic.  He is having some issues with imbalance.  He was having vertigo, but he underwent Epley maneuvers at physical therapy office and feels better now.  He has no other symptoms of myelopathy.  10/28/21 from [facility] note: Bobby Chen is a 77 y.o. male who presents for 3 month follow up of cervical stenosis and watch for symptoms of cervical myelopathy.  He was seen in early April and was doing fairly well at that time however he suffered a fall shortly after and has had increased right scapular pain worse with reaching forward and overhead he describes as sharp in nature.  In by his PCP and x-rays were obtained which were largely normal however his symptoms have persisted.  He also states that he has had more difficulty getting up from a seated position since this fall.  He states that he has had an increased number of falls over the last year but attributes this to his blood pressure and a recent diagnosis of vertigo.  He is currently scheduled to start physical therapy next week.  He denies any new or worsening radicular arm symptoms.  He does have a history of carpal tunnel and has constant numbness and tingling in his Chen although  states this has worsened since his fall in April.  LOV:  07/27/21 Bobby BOLANDER is a 77 y.o. male with known cervical stenosis and neck who presents for 6 week follow up after PT. he reports increased range of motion in his neck however he does continue to have some right-sided neck pain and interscapular pain when rotating his head to the right. Physical therapy is working on this. He denies any radiating arm symptoms, numbness or tingling into his Chen, changes in dexterity, or weakness.  HPI by Dr. Myer Haff: 05/31/2021 Bobby Chen is here today with a chief complaint of neck pain/stiffness and limited ROM. He also reports pain that radiates down the middle of his upper back and into his ears bilaterally. He denies numbness, tingling, or trouble with dexterity. He reports he has had balance issues since breaking his right ankle in 1984.   He has been having issues since August 2022. He has pain towards the top of his neck and has difficulty with rotating to the left.    Bowel/Bladder Dysfunction: none   Conservative measures:  Physical therapy: has not participated Multimodal medical therapy including regular antiinflammatories:  Robaxin, Lyrica, Ultram Injections: has not received epidural steroid injections   Past Surgery: none   Bobby Chen has no symptoms of cervical myelopathy.   The symptoms are causing a significant impact on the patient's life.    HPI by Manning Charity, PA: 04/28/2021: Telephone call: "I spoke with Bobby Chen  at length regarding his MRI results and concern for stenosis at C5-6. His primary complaint continues to be stiffness and neck pain with limited ROM to the left. He continues to be without any numbness, tingling, imbalance, or trouble with dexterity.  We did discuss further options regarding his cervical stenosis and I expressed that this was unlikely the cause of his neck pain but could manifest as myelopathy in the future. We discussed the symptoms of  cervical myelopathy.  We discussed treatment options such as PT which his insurance still will not cover according to him, and injections which he is not interested in. He would like to meet with Dr. Myer Haff about his cervical stenosis and discuss surgical options.  He was encouraged to call the office in the interim with any questions or concerns." 04/01/2021 Bobby Chen is a 77 y.o with a history of diabetes, hypertension, chronic pain, here today at the request of ENT for further evaluation of his neck.  He states that he has had neck pain that radiates down the middle of his upper back and into his ears bilaterally since August of this year without any inciting event.  He was recently seen by ENT who referred him to neurosurgery for further evaluation of "pitched nerves" in his cervical spine.  He denies any radiating arm pain.  He states that turning his head from side to side particularly when driving is more difficult and will occasionally cause the pain in his neck.  He also endorses some pain with looking up and down.  His primary complaint however is dizziness which seems to occur somewhat reasonably as well as ringing in his ears.  He underwent physical therapy for his right lower extremity and was referred to physical therapy for his neck and for gait training however his insurance not cover anymore sessions this year.  He denies any upper extremity weakness.  Of note he endorses several episodes of dried blood in his left ear.  He denies any trauma or using any penetrating objects.  Exam: Vitals:   11/08/22 0946  BP: (!) 150/90     NEUROLOGICAL:  General: In no acute distress.   Awake, alert, oriented to person, place, and time.  Pupils equal round and reactive to light.  Facial tone is symmetric.  Tongue protrusion is midline.  There is no pronator drift.  ROM of spine: full.  Palpation of spine: nontender.    Strength: Side Biceps Triceps Deltoid Interossei Grip Wrist Ext.  Wrist Flex.  R 5 5 5 5 5 5 5   L 5 5 5 5 5 5 5     No hyperreflexia noted.  Imaging: 04/15/21 MRI C spine IMPRESSION: Cervical spondylosis, as outlined and with findings most notably as follows.   At C5-C6, a moderate-sized central disc protrusion contributes to multifactorial severe spinal canal stenosis with moderate spinal cord flattening. No definite spinal cord signal abnormality is identified, although motion degradation limits evaluation. Multifactorial bilateral neural foraminal narrowing (moderate right, severe left).   No more than mild relative spinal canal narrowing at the remaining levels. Of note, a right center disc protrusion contacts the ventral spinal cord at C6-C7.   Additional sites of foraminal stenosis, as detailed and greatest on the left at C3-C4 (moderate) and on the right at C6-C7 (moderate).     Electronically Signed   By: Jackey Loge D.O.   On: 04/15/2021 12:34  MRI C spine 11/26/21 IMPRESSION: 1. Overall no significant interval change since 04/15/2021. No abnormal  enhancement. 2. Stable degenerative changes most advanced at C5-C6 where there is unchanged severe spinal canal stenosis and severe left and moderate right neural foraminal stenosis. No definite cord signal abnormality. 3. Unchanged moderate to severe left neural foraminal stenosis at C3-C4.     Electronically Signed   By: Lesia Hausen M.D.   On: 11/26/2021 13:44  MRI T spine 11/13/21 IMPRESSION: 1. Prominent spondylitic spurring and ligamentum flavum thickening at T3-4 to T9-10. Nodular thickening of the ligamentum flavum flattens the cord from behind at T3-4 and T9-10. No convincing cord signal abnormality. 2. Diffusely patent foramina.     Electronically Signed   By: Tiburcio Pea M.D.   On: 11/14/2021 05:58 I have personally reviewed the images and agree with the above interpretation.  Assessment and Plan: Bobby Chen is a pleasant 77 y.o. male with known cervical  stenosis.  He is not currently overtly myelopathic.  His symptoms appear stable.  He is not interested in moving forward with intervention at this time.  I agree with him on this.  We discussed warning signs.  He will return to clinic if he has any worsening symptoms.  In the meantime, he will continue to be careful about falls.  I have advised him to talk with his primary doctor regarding his blood pressure.  I spent a total of 15 minutes in both face-to-face and non-face-to-face activities for this visit on the date of this encounter.   Venetia Night MD Neurosurgery

## 2023-04-21 ENCOUNTER — Ambulatory Visit (INDEPENDENT_AMBULATORY_CARE_PROVIDER_SITE_OTHER): Payer: Medicare Other | Admitting: Nurse Practitioner

## 2023-04-21 ENCOUNTER — Encounter (INDEPENDENT_AMBULATORY_CARE_PROVIDER_SITE_OTHER): Payer: Self-pay | Admitting: Nurse Practitioner

## 2023-04-21 VITALS — BP 116/72 | HR 74 | Resp 16 | Wt 208.0 lb

## 2023-04-21 DIAGNOSIS — I890 Lymphedema, not elsewhere classified: Secondary | ICD-10-CM

## 2023-04-21 DIAGNOSIS — I10 Essential (primary) hypertension: Secondary | ICD-10-CM

## 2023-04-21 DIAGNOSIS — E1122 Type 2 diabetes mellitus with diabetic chronic kidney disease: Secondary | ICD-10-CM

## 2023-04-21 DIAGNOSIS — N1831 Chronic kidney disease, stage 3a: Secondary | ICD-10-CM

## 2023-04-23 ENCOUNTER — Encounter (INDEPENDENT_AMBULATORY_CARE_PROVIDER_SITE_OTHER): Payer: Self-pay | Admitting: Nurse Practitioner
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
## 2023-08-16 ENCOUNTER — Encounter: Payer: Self-pay | Admitting: Ophthalmology

## 2023-08-16 NOTE — Discharge Instructions (Signed)

## 2023-08-16 NOTE — Anesthesia Preprocedure Evaluation (Addendum)
 Anesthesia Evaluation  Patient identified by MRN, date of birth, ID band Patient awake    Reviewed: Allergy & Precautions, H&P , NPO status , Patient's Chart, lab work & pertinent test results  Airway Mallampati: IV  TM Distance: >3 FB Neck ROM: Full   Comment: Uses CPAP, has "dry mouth all the time" Dental no notable dental hx. (+) Caps   Pulmonary neg pulmonary ROS, sleep apnea , COPD   Pulmonary exam normal breath sounds clear to auscultation       Cardiovascular hypertension, + angina  + CAD and + Peripheral Vascular Disease  Normal cardiovascular exam Rhythm:Regular Rate:Normal  Office note: 07-11-23   Past medical history significant for CAD as noted by coronary artery calcification on CT chest, hypertension, hyperlipidemia, diabetes, obesity. Echo 01/2021 normal biventricular systolic function with no significant valvular abnormality. Stress test 2017 with no ischemia.  02-04-21 echo INTERPRETATION  NORMAL LEFT VENTRICULAR SYSTOLIC FUNCTION  NORMAL RIGHT VENTRICULAR SYSTOLIC FUNCTION  MILD VALVULAR REGURGITATION (See above)  NO VALVULAR STENOSIS  Normal right heart pressures     Neuro/Psych negative neurological ROS  negative psych ROS   GI/Hepatic negative GI ROS, Neg liver ROS, PUD,,,  Endo/Other  diabetes    Renal/GU Renal diseasenegative Renal ROS  negative genitourinary   Musculoskeletal negative musculoskeletal ROS (+)    Abdominal   Peds negative pediatric ROS (+)  Hematology negative hematology ROS (+)   Anesthesia Other Findings Mild intermittent reactive airway disease without complication COPD (chronic obstructive pulmonary disease)  Hypertension  Elevated lipids Brown recluse spider bite  Sleep apnea Obesity  Pulmonary arterial hypertension  Anginal pain  Diabetic neuritis  Cancer Vertigo Coronary artery disease involving native coronary artery of native heart without angina  pectoris  Aortic atherosclerosis  Erosive esophagitis  OSA on CPAP Mild pulmonary hypertension  Reflux esophagitis Type 2 diabetes mellitus with stage 3a chronic kidney disease, without long-term current use of insulin     Reproductive/Obstetrics negative OB ROS                              Anesthesia Physical Anesthesia Plan  ASA: 3  Anesthesia Plan: MAC   Post-op Pain Management:    Induction: Intravenous  PONV Risk Score and Plan:   Airway Management Planned: Natural Airway and Nasal Cannula  Additional Equipment:   Intra-op Plan:   Post-operative Plan:   Informed Consent: I have reviewed the patients History and Physical, chart, labs and discussed the procedure including the risks, benefits and alternatives for the proposed anesthesia with the patient or authorized representative who has indicated his/her understanding and acceptance.     Dental Advisory Given  Plan Discussed with: Anesthesiologist, CRNA and Surgeon  Anesthesia Plan Comments: (Patient consented for risks of anesthesia including but not limited to:  - adverse reactions to medications - damage to eyes, teeth, lips or other oral mucosa - nerve damage due to positioning  - sore throat or hoarseness - Damage to heart, brain, nerves, lungs, other parts of body or loss of life  Patient voiced understanding and assent.)        Anesthesia Quick Evaluation

## 2023-08-21 ENCOUNTER — Encounter: Payer: Self-pay | Admitting: Ophthalmology

## 2023-08-21 ENCOUNTER — Ambulatory Visit
Admission: RE | Admit: 2023-08-21 | Discharge: 2023-08-21 | Disposition: A | Discharge status: Home / Self Care | Attending: Ophthalmology | Admitting: Ophthalmology

## 2023-08-21 ENCOUNTER — Ambulatory Visit: Admitting: Anesthesiology

## 2023-08-21 ENCOUNTER — Encounter
Admission: RE | Disposition: A | Discharge status: Home / Self Care | Payer: Self-pay | Source: Home / Self Care | Attending: Ophthalmology

## 2023-08-21 ENCOUNTER — Other Ambulatory Visit: Payer: Self-pay

## 2023-08-21 DIAGNOSIS — Z7985 Long-term (current) use of injectable non-insulin antidiabetic drugs: Secondary | ICD-10-CM | POA: Insufficient documentation

## 2023-08-21 DIAGNOSIS — E669 Obesity, unspecified: Secondary | ICD-10-CM | POA: Insufficient documentation

## 2023-08-21 DIAGNOSIS — Z7984 Long term (current) use of oral hypoglycemic drugs: Secondary | ICD-10-CM | POA: Insufficient documentation

## 2023-08-21 DIAGNOSIS — I2510 Atherosclerotic heart disease of native coronary artery without angina pectoris: Secondary | ICD-10-CM | POA: Diagnosis not present

## 2023-08-21 DIAGNOSIS — I129 Hypertensive chronic kidney disease with stage 1 through stage 4 chronic kidney disease, or unspecified chronic kidney disease: Secondary | ICD-10-CM | POA: Insufficient documentation

## 2023-08-21 DIAGNOSIS — E1136 Type 2 diabetes mellitus with diabetic cataract: Secondary | ICD-10-CM | POA: Diagnosis not present

## 2023-08-21 DIAGNOSIS — H401111 Primary open-angle glaucoma, right eye, mild stage: Secondary | ICD-10-CM | POA: Diagnosis present

## 2023-08-21 DIAGNOSIS — E1122 Type 2 diabetes mellitus with diabetic chronic kidney disease: Secondary | ICD-10-CM | POA: Insufficient documentation

## 2023-08-21 DIAGNOSIS — N1831 Chronic kidney disease, stage 3a: Secondary | ICD-10-CM | POA: Diagnosis not present

## 2023-08-21 DIAGNOSIS — H2511 Age-related nuclear cataract, right eye: Secondary | ICD-10-CM | POA: Insufficient documentation

## 2023-08-21 DIAGNOSIS — Z6832 Body mass index (BMI) 32.0-32.9, adult: Secondary | ICD-10-CM | POA: Diagnosis not present

## 2023-08-21 DIAGNOSIS — G4733 Obstructive sleep apnea (adult) (pediatric): Secondary | ICD-10-CM | POA: Diagnosis not present

## 2023-08-21 DIAGNOSIS — J449 Chronic obstructive pulmonary disease, unspecified: Secondary | ICD-10-CM | POA: Insufficient documentation

## 2023-08-21 DIAGNOSIS — I700 Atherosclerosis of aorta: Secondary | ICD-10-CM | POA: Insufficient documentation

## 2023-08-21 HISTORY — DX: Atherosclerosis of aorta: I70.0

## 2023-08-21 HISTORY — DX: Ulcer of esophagus without bleeding: K22.10

## 2023-08-21 HISTORY — DX: Chronic kidney disease, stage 3a: N18.31

## 2023-08-21 HISTORY — DX: Obstructive sleep apnea (adult) (pediatric): G47.33

## 2023-08-21 HISTORY — DX: Atherosclerotic heart disease of native coronary artery without angina pectoris: I25.10

## 2023-08-21 HISTORY — DX: Pulmonary hypertension, unspecified: I27.20

## 2023-08-21 HISTORY — DX: Gastro-esophageal reflux disease with esophagitis, without bleeding: K21.00

## 2023-08-21 HISTORY — DX: Dizziness and giddiness: R42

## 2023-08-21 HISTORY — DX: Type 2 diabetes mellitus with diabetic chronic kidney disease: E11.22

## 2023-08-21 HISTORY — PX: EXTRACTION, CATARACT, WITH IOL AND MICRO STENT INSERTION: SHX7464

## 2023-08-21 LAB — GLUCOSE, CAPILLARY: Glucose-Capillary: 122 mg/dL — ABNORMAL HIGH (ref 70–99)

## 2023-08-21 SURGERY — EXTRACTION, CATARACT, WITH IOL AND MICRO STENT INSERTION
Anesthesia: Monitor Anesthesia Care | Site: Eye | Laterality: Right

## 2023-08-21 MED ORDER — PHENYLEPHRINE HCL 10 % OP SOLN
OPHTHALMIC | Status: AC
  Filled 2023-08-21: qty 5

## 2023-08-21 MED ORDER — FENTANYL CITRATE (PF) 100 MCG/2ML IJ SOLN
INTRAMUSCULAR | Status: AC
Start: 2023-08-21 — End: ?
  Filled 2023-08-21: qty 2

## 2023-08-21 MED ORDER — LIDOCAINE HCL (PF) 2 % IJ SOLN
INTRAOCULAR | Status: DC | PRN
  Administered 2023-08-21: 4 mL via INTRAOCULAR

## 2023-08-21 MED ORDER — MIDAZOLAM HCL 2 MG/2ML IJ SOLN
INTRAMUSCULAR | Status: DC | PRN
Start: 2023-08-21 — End: 2023-08-21
  Administered 2023-08-21: 2 mg via INTRAVENOUS

## 2023-08-21 MED ORDER — CYCLOPENTOLATE HCL 2 % OP SOLN
OPHTHALMIC | Status: AC
  Filled 2023-08-21: qty 2

## 2023-08-21 MED ORDER — SIGHTPATH DOSE#1 BSS IO SOLN
INTRAOCULAR | Status: DC | PRN
  Administered 2023-08-21: 84 mL via OPHTHALMIC

## 2023-08-21 MED ORDER — PHENYLEPHRINE HCL 10 % OP SOLN
1.0000 [drp] | OPHTHALMIC | Status: AC
  Administered 2023-08-21 (×3): 1 [drp] via OPHTHALMIC

## 2023-08-21 MED ORDER — MIDAZOLAM HCL 2 MG/2ML IJ SOLN
INTRAMUSCULAR | Status: AC
  Filled 2023-08-21: qty 2

## 2023-08-21 MED ORDER — CYCLOPENTOLATE HCL 2 % OP SOLN
1.0000 [drp] | OPHTHALMIC | Status: AC
  Administered 2023-08-21 (×3): 1 [drp] via OPHTHALMIC

## 2023-08-21 MED ORDER — SIGHTPATH DOSE#1 BSS IO SOLN
INTRAOCULAR | Status: DC | PRN
  Administered 2023-08-21: 15 mL via INTRAOCULAR

## 2023-08-21 MED ORDER — ARMC OPHTHALMIC DILATING DROPS
OPHTHALMIC | Status: AC
  Filled 2023-08-21: qty 0.5

## 2023-08-21 MED ORDER — TETRACAINE HCL 0.5 % OP SOLN
OPHTHALMIC | Status: AC
  Filled 2023-08-21: qty 4

## 2023-08-21 MED ORDER — FENTANYL CITRATE (PF) 100 MCG/2ML IJ SOLN
INTRAMUSCULAR | Status: DC | PRN
  Administered 2023-08-21 (×2): 50 ug via INTRAVENOUS

## 2023-08-21 MED ORDER — MOXIFLOXACIN HCL 0.5 % OP SOLN
OPHTHALMIC | Status: DC | PRN
  Administered 2023-08-21: .2 mL via OPHTHALMIC

## 2023-08-21 MED ORDER — TETRACAINE HCL 0.5 % OP SOLN
1.0000 [drp] | OPHTHALMIC | Status: AC
  Administered 2023-08-21 (×3): 1 [drp] via OPHTHALMIC

## 2023-08-21 MED ORDER — SIGHTPATH DOSE#1 NA HYALUR & NA CHOND-NA HYALUR IO KIT
PACK | INTRAOCULAR | Status: DC | PRN
  Administered 2023-08-21: 1 via OPHTHALMIC

## 2023-08-21 SURGICAL SUPPLY — 13 items
CATARACT SUITE SIGHTPATH (MISCELLANEOUS) ×1 IMPLANT
DISSECTOR HYDRO NUCLEUS 50X22 (MISCELLANEOUS) ×1 IMPLANT
FEE CATARACT SUITE SIGHTPATH (MISCELLANEOUS) ×1 IMPLANT
GLOVE PI ULTRA LF STRL 7.5 (GLOVE) ×1 IMPLANT
GLOVE SURG POLYISOPRENE 8.5 (GLOVE) ×1 IMPLANT
GLOVE SURG PROTEXIS BL SZ6.5 (GLOVE) ×1 IMPLANT
GLOVE SURG SYN 6.5 PF PI BL (GLOVE) ×1 IMPLANT
GLOVE SURG SYN 8.5 PF PI BL (GLOVE) ×1 IMPLANT
LENS IOL TECNIS EYHANCE 19.5 (Intraocular Lens) IMPLANT
MICROSTENT IVANTIS HYDRUS (Stent) IMPLANT
NDL FILTER BLUNT 18X1 1/2 (NEEDLE) ×1 IMPLANT
NEEDLE FILTER BLUNT 18X1 1/2 (NEEDLE) ×1 IMPLANT
SYR 3ML LL SCALE MARK (SYRINGE) ×1 IMPLANT

## 2023-08-21 NOTE — Op Note (Signed)
 OPERATIVE NOTE  LUSTER STREETMAN [id number] 08/21/2023  PREOPERATIVE DIAGNOSIS:   1.  Mild PRIMARY open angle glaucoma, right eye. H40.1111  2.  Nuclear sclerotic cataract right eye.  H25.11   POSTOPERATIVE DIAGNOSIS:    same.   PROCEDURE:   1.  Placement of trabecular bypass stent (hydrus) and phacoemusification with posterior chamber intraocular lens placement of the right eye  CPT [phone]   LENS: Implant Name Type Inv. Item Serial No. Manufacturer Lot No. LRB No. Used Action  LENS IOL TECNIS EYHANCE 19.5 - [id number] Intraocular Lens LENS IOL TECNIS EYHANCE 19.5 [id number] [employer]  Right 1 Implanted  MICROSTENT IVANTIS HYDRUS - [id number] Stent MICROSTENT IVANTIS HYDRUS  [employer] [id number] Right 1 Implanted      Procedure(s): EXTRACTION, CATARACT, WITH IOL AND MICRO STENT INSERTION 5.85 00:37.9 (Right)    SURGEON:  Dusty Gin, MD, MPH  ANESTHESIOLOGIST: Anesthesiologist: Emilie Harden, MD CRNA: Coley Davis, CRNA   ANESTHESIA:  MAC and intracameral preservative-free lidocaine  4%.  ESTIMATED BLOOD LOSS: less than 1 mL.   COMPLICATIONS:  None.   DESCRIPTION OF PROCEDURE:  The patient was identified in the holding room and transported to the operating room.   The patient was placed in the supine position under the operating microscope.  The right eye was prepped and draped in the usual sterile ophthalmic fashion.   A 1.0 millimeter clear-corneal paracentesis was made at the 10:30 position and a second paracentesis at 7:00.  0.5 ml of preservative-free 1% lidocaine  with epinephrine  was injected into the anterior chamber.  The anterior chamber was filled with viscoelastic.  A 2.4 millimeter keratome was used to make a near-clear corneal incision at the 8:00 position.   Attention was turned to the hydrus.  The patients head was turned to the left and the microscope was tilted to 035 degrees.  Ocular instruments/Glaukos OAL/H2 gonioprism was used coupled with viscoelastic  on the cornea was used to visualize the trabecular meshwork. The hydrus was opened and introduced into the eye.  The meshwork was engaged with the tip of the injector and the hydrus stent was deployed into Schlemm's canal at 4:00.  The stent was well seated and in good position.  Next, attention was turned to the phacoemulsification A curvilinear capsulorrhexis was made with a cystotome and capsulorrhexis forceps.  Balanced salt solution was used to hydrodissect and hydrodelineate the nucleus.   Phacoemulsification was then used in stop and chop fashion to remove the lens nucleus and epinucleus.  The remaining cortex was then removed using the irrigation and aspiration handpiece. Viscoelastic was then placed into the capsular bag to distend it for lens placement.  A lens was then injected into the capsular bag.  The remaining viscoelastic was aspirated.   Wounds were hydrated with balanced salt solution.  The anterior chamber was inflated to a physiologic pressure with balanced salt solution.   Intracameral vigamox 0.1 mL undiluted was injected into the eye and a drop placed onto the ocular surface.  No wound leaks were noted. The patient was taken to the recovery room in stable condition without complications of anesthesia or surgery   Dusty Gin 08/21/2023, 11:26 AM

## 2023-08-21 NOTE — H&P (Signed)
 [facility]   Primary Care Physician:  Jimmy Moulding, MD Ophthalmologist: Dr. Dusty Gin  Pre-Procedure History & Physical: HPI:  Bobby Chen is a 78 y.o. male here for cataract surgery.   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, LEFT ARM    COPD (chronic obstructive pulmonary disease) (HCC)    Coronary artery disease involving native coronary artery of native heart without angina pectoris    Diabetes mellitus without complication (HCC)    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HCC)    Elevated lipids    Erosive esophagitis    Hypertension    Mild pulmonary hypertension (HCC)    Obesity    OSA on CPAP    Pulmonary arterial hypertension (HCC)    Reflux esophagitis    Sleep apnea    CPAP   Type 2 diabetes mellitus with stage 3a chronic kidney disease, without long-term current use of insulin (HCC)    Vertigo    No issues since crystals resettled    Past Surgical History:  Procedure Laterality Date   ANKLE FRACTURE SURGERY     BASAL CELL CARCINOMA EXCISION N/A    scalp   COLONOSCOPY W/ POLYPECTOMY     COLONOSCOPY WITH PROPOFOL  N/A 01/18/2016   Procedure: COLONOSCOPY WITH PROPOFOL ;  Surgeon: Deveron Fly, MD;  Location: [facility] ENDOSCOPY;  Service: Endoscopy;  Laterality: N/A;   COLONOSCOPY WITH PROPOFOL  N/A 07/02/2018   Procedure: COLONOSCOPY WITH PROPOFOL ;  Surgeon: Deveron Fly, MD;  Location: [facility] ENDOSCOPY;  Service: Endoscopy;  Laterality: N/A;   ESOPHAGOGASTRODUODENOSCOPY (EGD) WITH PROPOFOL  N/A 01/18/2016   Procedure: ESOPHAGOGASTRODUODENOSCOPY (EGD) WITH PROPOFOL ;  Surgeon: Deveron Fly, MD;  Location: [facility] ENDOSCOPY;  Service: Endoscopy;  Laterality: N/A;   FRACTURE SURGERY Right 1984   ankle   HERNIA REPAIR     UMBILICAL   LEG SURGERY Left    NASAL SEPTOPLASTY W/ TURBINOPLASTY Bilateral 11/06/2019   Procedure: NASAL SEPTOPLASTY WITH  INFERIOR TURBINATE REDUCTION;  Surgeon: Mellody Sprout, MD;  Location: [facility] ORS;  Service: ENT;  Laterality: Bilateral;   toenail removal Bilateral     Prior to Admission medications   Medication Sig Start Date End Date Taking? Authorizing Provider  acetaminophen  (TYLENOL ) 500 MG tablet Take 500 mg by mouth every 6 (six) hours as needed.   Yes [provider]  albuterol (PROVENTIL HFA;VENTOLIN HFA) 108 (90 Base) MCG/ACT inhaler Inhale 2 puffs into the lungs every 4 (four) hours as needed for wheezing or shortness of breath.   Yes [provider]  amLODipine (NORVASC) 5 MG tablet Take 5 mg by mouth daily. 03/14/22  Yes [provider]  atorvastatin (LIPITOR) 40 MG tablet Take 1 tablet by mouth at bedtime. 07/04/22  Yes [provider]  brimonidine-timolol (COMBIGAN) 0.2-0.5 % ophthalmic solution Place 1 drop into both eyes every 12 (twelve) hours. 01/29/22  Yes [provider]  chlorhexidine  (PERIDEX ) 0.12 % solution SMARTSIG:By Mouth 07/01/21  Yes [provider]  citalopram (CELEXA) 10 MG tablet Take 1 tablet by mouth daily. 11/06/20  Yes [provider]  Cyanocobalamin (VITAMIN B-12 PO) Take by mouth daily.   Yes [provider]  cyclobenzaprine (FLEXERIL) 10 MG tablet Take 10 mg by mouth 3 (three) times daily as needed.   Yes [provider]  Dulaglutide (TRULICITY) 3 MG/0.5ML SOPN Inject into the skin once  a week.   Yes [provider]  fluticasone  (FLONASE ) 50 MCG/ACT nasal spray Place 2 sprays into both nostrils daily. 05/31/18  Yes Georgiann Kirsch, PA-C  Garlic 10 MG CAPS Take by mouth daily.   Yes [provider]  glipiZIDE (GLUCOTROL) 10 MG tablet Take 10 mg by mouth daily before breakfast.   Yes [provider]  losartan (COZAAR) 100 MG tablet Take 100 mg by mouth daily. 11/10/21 08/16/23 Yes [provider]  metFORMIN (GLUCOPHAGE-XR) 500 MG 24 hr tablet Take 1,000 mg by mouth 2  (two) times daily.    Yes [provider]  montelukast (SINGULAIR) 10 MG tablet  12/10/20  Yes [provider]  nortriptyline (PAMELOR) 10 MG capsule Take 50 mg by mouth. 08/18/21  Yes [provider]  pantoprazole (PROTONIX) 40 MG tablet Take 40 mg by mouth daily as needed (heartburn).  01/09/17  Yes [provider]  pregabalin (LYRICA) 50 MG capsule Take 50 mg by mouth 2 (two) times daily. 08/18/21  Yes [provider]  Propylene Glycol (SYSTANE BALANCE) 0.6 % SOLN Apply 1 drop to eye 2 (two) times daily as needed (dry eyes).   Yes [provider]  simvastatin (ZOCOR) 40 MG tablet Take 40 mg by mouth daily.   Yes [provider]  VITAMIN A PO Take 1 tablet by mouth daily.   Yes [provider]    Allergies as of 07/11/2023 - Review Complete 04/23/2023  Allergen Reaction Noted   Amoxil [amoxicillin] Nausea And Vomiting 01/15/2016   Aspirin buf(cacarb-mgcarb-mgo)  01/15/2016   Aspirin buffered  09/29/2013    Family History  Problem Relation Age of Onset   Ovarian cancer Mother    Ulcers Father     Social History   Socioeconomic History   Marital status: Widowed    Spouse name: Not on file   Number of children: Not on file   Years of education: Not on file   Highest education level: Not on file  Occupational History   Not on file  Tobacco Use   Smoking status: Never   Smokeless tobacco: Never  Vaping Use   Vaping status: Never Used  Substance and Sexual Activity   Alcohol use: No   Drug use: No   Sexual activity: Not on file  Other Topics Concern   Not on file  Social History Narrative   Not on file   Social Drivers of Health   Financial Resource Strain: Low Risk  (11/02/2022)   [facility]   Overall Financial Resource Strain (CARDIA)    Difficulty of Paying Living Expenses: Not hard at all  Food Insecurity: No Food Insecurity (11/02/2022)   [facility]   Hunger Vital Sign    Worried About Running Out of Food in the Last Year: Never true    Ran Out of Food in the Last Year: Never true  Transportation Needs: No Transportation Needs (11/02/2022)   Received from [facility] - Transportation    In the past 12 months, has lack of transportation kept you from medical appointments or from getting medications?: No    Lack of Transportation (Non-Medical): No  Physical Activity: Not on file  Stress: Not on file  Social Connections: Not on file  Intimate Partner Violence: Not on file    Review of Systems: See HPI, otherwise negative ROS  Physical Exam: BP (!) 141/81   Pulse 86  Temp 97.7 F (36.5 C) (Temporal)   Resp 18   Ht 5\' 7"  (1.702 m)   Wt 95.3 kg   SpO2 98%   BMI 32.89 kg/m  General:   Alert, cooperative. Head:  Normocephalic and atraumatic. Respiratory:  Normal work of breathing. Cardiovascular:  NAD  Impression/Plan: Bobby Chen is here for cataract surgery.  Risks, benefits, limitations, and alternatives regarding cataract surgery have been reviewed with the patient.  Questions have been answered.  All parties agreeable.   Dusty Gin, MD  08/21/2023, 10:52 AM

## 2023-08-21 NOTE — Transfer of Care (Signed)
 Immediate Anesthesia Transfer of Care Note  Patient: Bobby Chen  Procedure(s) Performed: EXTRACTION, CATARACT, WITH IOL AND MICRO STENT INSERTION 5.85 00:37.9 (Right: Eye)  Patient Location: PACU  Anesthesia Type: MAC  Level of Consciousness: awake, alert  and patient cooperative  Airway and Oxygen Therapy: Patient Spontanous Breathing and Patient connected to supplemental oxygen  Post-op Assessment: Post-op Vital signs reviewed, Patient's Cardiovascular Status Stable, Respiratory Function Stable, Patent Airway and No signs of Nausea or vomiting  Post-op Vital Signs: Reviewed and stable  Complications: No notable events documented.

## 2023-08-21 NOTE — Anesthesia Postprocedure Evaluation (Signed)
 Anesthesia Post Note  Patient: PHILBERT BAR  Procedure(s) Performed: EXTRACTION, CATARACT, WITH IOL AND MICRO STENT INSERTION 5.85 00:37.9 (Right: Eye)  Patient location during evaluation: PACU Anesthesia Type: MAC Level of consciousness: awake and alert Pain management: pain level controlled Vital Signs Assessment: post-procedure vital signs reviewed and stable Respiratory status: spontaneous breathing, nonlabored ventilation, respiratory function stable and patient connected to nasal cannula oxygen Cardiovascular status: blood pressure returned to baseline and stable Postop Assessment: no apparent nausea or vomiting Anesthetic complications: no   No notable events documented.   Last Vitals:  Vitals:   08/21/23 1127 08/21/23 1131  BP: 114/87 114/72  Pulse: 83 78  Resp: 14 13  Temp: 36.4 C 36.4 C  SpO2: 94% 93%    Last Pain:  Vitals:   08/21/23 1131  TempSrc:   PainSc: 0-No pain                 Alejos Reinhardt C Joanell Cressler

## 2023-08-22 ENCOUNTER — Encounter: Payer: Self-pay | Admitting: Ophthalmology

## 2023-09-12 ENCOUNTER — Encounter (INDEPENDENT_AMBULATORY_CARE_PROVIDER_SITE_OTHER): Payer: Self-pay

## 2023-09-14 NOTE — Anesthesia Preprocedure Evaluation (Addendum)
 Anesthesia Evaluation  Patient identified by MRN, date of birth, ID band Patient awake    Reviewed: Allergy & Precautions, H&P , NPO status , Patient's Chart, lab work & pertinent test results  Airway Mallampati: IV  TM Distance: >3 FB Neck ROM: Full    Dental no notable dental hx. (+) Caps   Pulmonary neg pulmonary ROS, sleep apnea , COPD   Pulmonary exam normal breath sounds clear to auscultation       Cardiovascular hypertension, + angina  + CAD and + Peripheral Vascular Disease  negative cardio ROS Normal cardiovascular exam Rhythm:Regular Rate:Normal  Office note: 07-11-23   Past medical history significant for CAD as noted by coronary artery calcification on CT chest, hypertension, hyperlipidemia, diabetes, obesity. Echo 01/2021 normal biventricular systolic function with no significant valvular abnormality. Stress test 2017 with no ischemia.   02-04-21 echo INTERPRETATION  NORMAL LEFT VENTRICULAR SYSTOLIC FUNCTION  NORMAL RIGHT VENTRICULAR SYSTOLIC FUNCTION  MILD VALVULAR REGURGITATION (See above)  NO VALVULAR STENOSIS  Normal right heart pressures     Neuro/Psych negative neurological ROS  negative psych ROS   GI/Hepatic negative GI ROS, Neg liver ROS, PUD,,,  Endo/Other  negative endocrine ROSdiabetes    Renal/GU Renal diseasenegative Renal ROS  negative genitourinary   Musculoskeletal negative musculoskeletal ROS (+)    Abdominal   Peds negative pediatric ROS (+)  Hematology negative hematology ROS (+)   Anesthesia Other Findings Previous cataract surgery 08-21-23 Dr. Aldo Amble  Mild intermittent reactive airway disease without complication COPD (chronic obstructive pulmonary disease)  Hypertension             Elevated lipids Brown recluse spider bite       Sleep apnea Obesity             Pulmonary arterial hypertension  Anginal pain  Diabetic neuritis  Cancer Vertigo Coronary artery disease  involving native coronary artery of native heart without angina pectoris       Aortic atherosclerosis  Erosive esophagitis      OSA on CPAP Mild pulmonary hypertension  Reflux esophagitis Type 2 diabetes mellitus with stage 3a chronic kidney disease, without long-term current use of insulin    Reproductive/Obstetrics negative OB ROS                             Anesthesia Physical Anesthesia Plan  ASA: 3  Anesthesia Plan: MAC   Post-op Pain Management:    Induction: Intravenous  PONV Risk Score and Plan:   Airway Management Planned: Natural Airway and Nasal Cannula  Additional Equipment:   Intra-op Plan:   Post-operative Plan:   Informed Consent: I have reviewed the patients History and Physical, chart, labs and discussed the procedure including the risks, benefits and alternatives for the proposed anesthesia with the patient or authorized representative who has indicated his/her understanding and acceptance.     Dental Advisory Given  Plan Discussed with: Anesthesiologist, CRNA and Surgeon  Anesthesia Plan Comments: (Patient consented for risks of anesthesia including but not limited to:  - adverse reactions to medications - damage to eyes, teeth, lips or other oral mucosa - nerve damage due to positioning  - sore throat or hoarseness - Damage to heart, brain, nerves, lungs, other parts of body or loss of life  Patient voiced understanding and assent.)        Anesthesia Quick Evaluation

## 2023-09-22 NOTE — Discharge Instructions (Signed)

## 2023-09-25 ENCOUNTER — Ambulatory Visit: Payer: Self-pay | Admitting: Anesthesiology

## 2023-09-25 ENCOUNTER — Ambulatory Visit
Admission: RE | Admit: 2023-09-25 | Discharge: 2023-09-25 | Disposition: A | Discharge status: Home / Self Care | Attending: Ophthalmology | Admitting: Ophthalmology

## 2023-09-25 ENCOUNTER — Encounter: Payer: Self-pay | Admitting: Ophthalmology

## 2023-09-25 ENCOUNTER — Other Ambulatory Visit: Payer: Self-pay

## 2023-09-25 ENCOUNTER — Encounter
Admission: RE | Disposition: A | Discharge status: Home / Self Care | Payer: Self-pay | Source: Home / Self Care | Attending: Ophthalmology

## 2023-09-25 DIAGNOSIS — I2510 Atherosclerotic heart disease of native coronary artery without angina pectoris: Secondary | ICD-10-CM | POA: Insufficient documentation

## 2023-09-25 DIAGNOSIS — I2721 Secondary pulmonary arterial hypertension: Secondary | ICD-10-CM | POA: Diagnosis not present

## 2023-09-25 DIAGNOSIS — H401121 Primary open-angle glaucoma, left eye, mild stage: Secondary | ICD-10-CM | POA: Insufficient documentation

## 2023-09-25 DIAGNOSIS — J449 Chronic obstructive pulmonary disease, unspecified: Secondary | ICD-10-CM | POA: Diagnosis not present

## 2023-09-25 DIAGNOSIS — E1122 Type 2 diabetes mellitus with diabetic chronic kidney disease: Secondary | ICD-10-CM | POA: Insufficient documentation

## 2023-09-25 DIAGNOSIS — I129 Hypertensive chronic kidney disease with stage 1 through stage 4 chronic kidney disease, or unspecified chronic kidney disease: Secondary | ICD-10-CM | POA: Insufficient documentation

## 2023-09-25 DIAGNOSIS — Z6833 Body mass index (BMI) 33.0-33.9, adult: Secondary | ICD-10-CM | POA: Diagnosis not present

## 2023-09-25 DIAGNOSIS — E1136 Type 2 diabetes mellitus with diabetic cataract: Secondary | ICD-10-CM | POA: Insufficient documentation

## 2023-09-25 DIAGNOSIS — G4733 Obstructive sleep apnea (adult) (pediatric): Secondary | ICD-10-CM | POA: Insufficient documentation

## 2023-09-25 DIAGNOSIS — N1831 Chronic kidney disease, stage 3a: Secondary | ICD-10-CM | POA: Insufficient documentation

## 2023-09-25 DIAGNOSIS — E669 Obesity, unspecified: Secondary | ICD-10-CM | POA: Diagnosis not present

## 2023-09-25 DIAGNOSIS — H2512 Age-related nuclear cataract, left eye: Secondary | ICD-10-CM | POA: Insufficient documentation

## 2023-09-25 HISTORY — PX: GONIOTOMY: SHX7582

## 2023-09-25 HISTORY — PX: EXTRACTION, CATARACT, WITH IOL AND MICRO STENT INSERTION: SHX7464

## 2023-09-25 LAB — GLUCOSE, CAPILLARY: Glucose-Capillary: 122 mg/dL — ABNORMAL HIGH (ref 70–99)

## 2023-09-25 SURGERY — EXTRACTION, CATARACT, WITH IOL AND MICRO STENT INSERTION
Anesthesia: Monitor Anesthesia Care | Site: Eye | Laterality: Left

## 2023-09-25 MED ORDER — FENTANYL CITRATE (PF) 100 MCG/2ML IJ SOLN
INTRAMUSCULAR | Status: AC
  Filled 2023-09-25: qty 2

## 2023-09-25 MED ORDER — SIGHTPATH DOSE#1 BSS IO SOLN
INTRAOCULAR | Status: DC | PRN
  Administered 2023-09-25: 69 mL via OPHTHALMIC

## 2023-09-25 MED ORDER — FENTANYL CITRATE (PF) 100 MCG/2ML IJ SOLN
INTRAMUSCULAR | Status: DC | PRN
Start: 2023-09-25 — End: 2023-09-25
  Administered 2023-09-25 (×2): 50 ug via INTRAVENOUS

## 2023-09-25 MED ORDER — PHENYLEPHRINE HCL 10 % OP SOLN
OPHTHALMIC | Status: AC
  Filled 2023-09-25: qty 5

## 2023-09-25 MED ORDER — SIGHTPATH DOSE#1 NA HYALUR & NA CHOND-NA HYALUR IO KIT
PACK | INTRAOCULAR | Status: DC | PRN
  Administered 2023-09-25: 1 via OPHTHALMIC

## 2023-09-25 MED ORDER — SIGHTPATH DOSE#1 BSS IO SOLN
INTRAOCULAR | Status: DC | PRN
  Administered 2023-09-25: 15 mL via INTRAOCULAR

## 2023-09-25 MED ORDER — PHENYLEPHRINE HCL 10 % OP SOLN
1.0000 [drp] | OPHTHALMIC | Status: AC
  Administered 2023-09-25 (×3): 1 [drp] via OPHTHALMIC

## 2023-09-25 MED ORDER — CYCLOPENTOLATE HCL 2 % OP SOLN
1.0000 [drp] | OPHTHALMIC | Status: AC
  Administered 2023-09-25 (×3): 1 [drp] via OPHTHALMIC

## 2023-09-25 MED ORDER — TETRACAINE HCL 0.5 % OP SOLN
1.0000 [drp] | OPHTHALMIC | Status: AC
  Administered 2023-09-25 (×3): 1 [drp] via OPHTHALMIC

## 2023-09-25 MED ORDER — ARMC OPHTHALMIC DILATING DROPS
OPHTHALMIC | Status: AC
  Filled 2023-09-25: qty 0.5

## 2023-09-25 MED ORDER — TETRACAINE HCL 0.5 % OP SOLN
OPHTHALMIC | Status: AC
  Filled 2023-09-25: qty 4

## 2023-09-25 MED ORDER — MIDAZOLAM HCL 2 MG/2ML IJ SOLN
INTRAMUSCULAR | Status: DC | PRN
  Administered 2023-09-25 (×2): 1 mg via INTRAVENOUS

## 2023-09-25 MED ORDER — LIDOCAINE HCL (PF) 2 % IJ SOLN
INTRAOCULAR | Status: DC | PRN
  Administered 2023-09-25: 4 mL via INTRAOCULAR

## 2023-09-25 MED ORDER — CYCLOPENTOLATE HCL 2 % OP SOLN
OPHTHALMIC | Status: AC
  Filled 2023-09-25: qty 2

## 2023-09-25 MED ORDER — MIDAZOLAM HCL 2 MG/2ML IJ SOLN
INTRAMUSCULAR | Status: AC
  Filled 2023-09-25: qty 2

## 2023-09-25 MED ORDER — ARTIFICIAL TEARS OPHTHALMIC OINT
TOPICAL_OINTMENT | OPHTHALMIC | Status: AC
  Filled 2023-09-25: qty 3.5

## 2023-09-25 MED ORDER — MOXIFLOXACIN HCL 0.5 % OP SOLN
OPHTHALMIC | Status: DC | PRN
  Administered 2023-09-25: .2 mL via OPHTHALMIC

## 2023-09-25 SURGICAL SUPPLY — 13 items
CATARACT SUITE SIGHTPATH (MISCELLANEOUS) ×1 IMPLANT
DISSECTOR HYDRO NUCLEUS 50X22 (MISCELLANEOUS) ×1 IMPLANT
FEE CATARACT SUITE SIGHTPATH (MISCELLANEOUS) ×1 IMPLANT
GLOVE PI ULTRA LF STRL 7.5 (GLOVE) ×1 IMPLANT
GLOVE SURG POLYISOPRENE 8.5 (GLOVE) ×1 IMPLANT
GLOVE SURG PROTEXIS BL SZ6.5 (GLOVE) ×1 IMPLANT
GLOVE SURG SYN 6.5 PF PI BL (GLOVE) ×1 IMPLANT
GLOVE SURG SYN 8.5 PF PI BL (GLOVE) ×1 IMPLANT
LENS IOL TECNIS EYHANCE 19.5 (Intraocular Lens) IMPLANT
MICROSTENT IVANTIS HYDRUS (Stent) IMPLANT
NDL FILTER BLUNT 18X1 1/2 (NEEDLE) ×1 IMPLANT
NEEDLE FILTER BLUNT 18X1 1/2 (NEEDLE) ×1 IMPLANT
SYR 3ML LL SCALE MARK (SYRINGE) ×1 IMPLANT

## 2023-09-25 NOTE — Transfer of Care (Signed)
 Immediate Anesthesia Transfer of Care Note  Patient: Bobby Chen  Procedure(s) Performed: EXTRACTION, CATARACT, WITH IOL AND MICRO STENT INSERTION 3.35 00:22.1 (Left: Eye) GONIOTOMY (Left: Eye)  Patient Location: PACU  Anesthesia Type: MAC  Level of Consciousness: awake, alert  and patient cooperative  Airway and Oxygen Therapy: Patient Spontanous Breathing and Patient connected to supplemental oxygen  Post-op Assessment: Post-op Vital signs reviewed, Patient's Cardiovascular Status Stable, Respiratory Function Stable, Patent Airway and No signs of Nausea or vomiting  Post-op Vital Signs: Reviewed and stable  Complications: No notable events documented.

## 2023-09-25 NOTE — Op Note (Signed)
 OPERATIVE NOTE  SAM OVERBECK [id number] 09/25/2023  PREOPERATIVE DIAGNOSIS:   1.  Mild  PRIMARY open angle glaucoma, left eye. N82.9562 2.  Nuclear sclerotic cataract left eye.  H25.12   POSTOPERATIVE DIAGNOSIS:    same.   PROCEDURE:   1.  Placement of trabecular bypass stent (hydrus) and phacoemusification with posterior chamber intraocular lens placement of the left eye  CPT [phone]   LENS: Implant Name Type Inv. Item Serial No. Manufacturer Lot No. LRB No. Used Action  LENS IOL TECNIS EYHANCE 19.5 - [id number] Intraocular Lens LENS IOL TECNIS EYHANCE 19.5 [id number] SIGHTPATH  Left 1 Implanted  MICROSTENT IVANTIS HYDRUS - [id number] Stent MICROSTENT IVANTIS HYDRUS  SIGHTPATH [id number] Left 1 Implanted      Procedure(s): EXTRACTION, CATARACT, WITH IOL AND MICRO STENT INSERTION 3.35 00:22.1 (Left) GONIOTOMY (Left)    SURGEON:  Dusty Gin, MD, MPH  ANESTHESIOLOGIST: Anesthesiologist: Emilie Harden, MD CRNA: Sherrlyn Dolores, CRNA   ANESTHESIA:  MAC  and intracameral preservative-free intracameral lidocaine  4%.  ESTIMATED BLOOD LOSS: less than 1 mL.   COMPLICATIONS:  None.   DESCRIPTION OF PROCEDURE:  The patient was identified in the holding room and transported to the operating room.  The patient was placed in the supine position under the operating microscope.  The left eye was prepped and draped in the usual sterile ophthalmic fashion.   A 1.0 millimeter clear-corneal paracentesis was made at the 4:30 position, and a second paracentesis was made at 1:30 for the hydrus. 0.5 ml of preservative-free 1% lidocaine  with epinephrine  was injected into the anterior chamber.  The anterior chamber was filled with Healon 5 viscoelastic.  A 2.4 millimeter keratome was used to make a near-clear corneal incision at the 2:00 position.   Attention was turned to the hydrus stent.  The patients head was turned to the left and the microscope was tilted to 035 degrees.  Ocular  instruments/Glaukos OAL/H2 gonioprism was used with Healon 5 on the cornea to visualize the trabecular meshwork. The hydrus was introduced into the eye and the  meshwork was engaged with the tip of the and the stent was deployed into Schlemm's canal.  The stent was well seated and in good position.  Next, attention was turned to the phacoemulsification A curvilinear capsulorrhexis was made with a cystotome and capsulorrhexis forceps.  Balanced salt  solution was used to hydrodissect and hydrodelineate the nucleus.   Phacoemulsification was then used in stop and chop fashion to remove the lens nucleus and epinucleus.  The remaining cortex was then removed using the irrigation and aspiration handpiece. Healon was then placed into the capsular bag to distend it for lens placement.  A lens was then injected into the capsular bag.  The remaining viscoelastic was aspirated.   Wounds were hydrated with balanced salt  solution.  The anterior chamber was inflated to a physiologic pressure with balanced salt  solution.   Intracameral vigamox  0.1 mL undiluted was injected into the eye and a drop placed onto the ocular surface.  No wound leaks were noted.  Protective glasses were placed on the patient.  The patient was taken to the recovery room in stable condition without complications of anesthesia or surgery   Dusty Gin 09/25/2023, 9:50 AM

## 2023-09-25 NOTE — H&P (Signed)
 [facility]   Primary Care Physician:  Jimmy Moulding, MD Ophthalmologist: Dr. Dusty Gin  Pre-Procedure History & Physical: HPI:  Bobby Chen is a 78 y.o. male here for cataract surgery.   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, LEFT ARM    COPD (chronic obstructive pulmonary disease) (HCC)    Coronary artery disease involving native coronary artery of native heart without angina pectoris    Diabetes mellitus without complication (HCC)    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HCC)    Elevated lipids    Erosive esophagitis    Hypertension    Mild pulmonary hypertension (HCC)    Obesity    OSA on CPAP    Pulmonary arterial hypertension (HCC)    Reflux esophagitis    Sleep apnea    CPAP   Type 2 diabetes mellitus with stage 3a chronic kidney disease, without long-term current use of insulin (HCC)    Vertigo    No issues since crystals resettled    Past Surgical History:  Procedure Laterality Date   ANKLE FRACTURE SURGERY     BASAL CELL CARCINOMA EXCISION N/A    scalp   COLONOSCOPY W/ POLYPECTOMY     COLONOSCOPY WITH PROPOFOL  N/A 01/18/2016   Procedure: COLONOSCOPY WITH PROPOFOL ;  Surgeon: Deveron Fly, MD;  Location: [facility] ENDOSCOPY;  Service: Endoscopy;  Laterality: N/A;   COLONOSCOPY WITH PROPOFOL  N/A 07/02/2018   Procedure: COLONOSCOPY WITH PROPOFOL ;  Surgeon: Deveron Fly, MD;  Location[facility] ENDOSCOPY;  Service: Endoscopy;  Laterality: N/A;   ESOPHAGOGASTRODUODENOSCOPY (EGD) WITH PROPOFOL  N/A 01/18/2016   Procedure: ESOPHAGOGASTRODUODENOSCOPY (EGD) WITH PROPOFOL ;  Surgeon: Deveron Fly, MD;  Location: [facility] ENDOSCOPY;  Service: Endoscopy;  Laterality: N/A;   EXTRACTION, CATARACT, WITH IOL AND MICRO STENT INSERTION Right 08/21/2023   Procedure: EXTRACTION, CATARACT, WITH IOL AND MICRO STENT INSERTION 5.85 00:37.9;  Surgeon: Rosa College,  MD;  Location: [facility]ERY [facility];  Service: Ophthalmology;  Laterality: Right;   FRACTURE SURGERY Right 1984   ankle   HERNIA REPAIR     UMBILICAL   LEG SURGERY Left    NASAL SEPTOPLASTY W/ TURBINOPLASTY Bilateral 11/06/2019   Procedure: NASAL SEPTOPLASTY WITH INFERIOR TURBINATE REDUCTION;  Surgeon: Mellody Sprout, MD;  Location: [facility] ORS;  Service: ENT;  Laterality: Bilateral;   toenail removal Bilateral     Prior to Admission medications   Medication Sig Start Date End Date Taking? Authorizing Provider  acetaminophen  (TYLENOL ) 500 MG tablet Take 500 mg by mouth every 6 (six) hours as needed.   Yes [provider]  albuterol (PROVENTIL HFA;VENTOLIN HFA) 108 (90 Base) MCG/ACT inhaler Inhale 2 puffs into the lungs every 4 (four) hours as needed for wheezing or shortness of breath.   Yes [provider]  amLODipine (NORVASC) 5 MG tablet Take 5 mg by mouth daily. 03/14/22  Yes [provider]  atorvastatin (LIPITOR) 40 MG tablet Take 1 tablet by mouth at bedtime. 07/04/22  Yes [provider]  brimonidine-timolol (COMBIGAN) 0.2-0.5 % ophthalmic solution Place 1 drop into both eyes every 12 (twelve) hours. 01/29/22  Yes [provider]  chlorhexidine  (PERIDEX ) 0.12 % solution SMARTSIG:By Mouth 07/01/21  Yes [provider]  citalopram (CELEXA) 10 MG tablet Take 1 tablet by mouth daily. 11/06/20  Yes [provider]  Cyanocobalamin (VITAMIN B-12 PO) Take by  mouth daily.   Yes [provider]  cyclobenzaprine (FLEXERIL) 10 MG tablet Take 10 mg by mouth 3 (three) times daily as needed.   Yes [provider]  Dulaglutide (TRULICITY) 3 MG/0.5ML SOPN Inject into the skin once a week.   Yes [provider]  fluticasone  (FLONASE ) 50 MCG/ACT nasal spray Place 2 sprays into both nostrils daily. 05/31/18  Yes Georgiann Kirsch, PA-C  Garlic 10 MG CAPS Take by mouth daily.   Yes [provider]  glipiZIDE (GLUCOTROL)  10 MG tablet Take 10 mg by mouth daily before breakfast.   Yes [provider]  metFORMIN (GLUCOPHAGE-XR) 500 MG 24 hr tablet Take 1,000 mg by mouth 2 (two) times daily.    Yes [provider]  montelukast (SINGULAIR) 10 MG tablet  12/10/20  Yes [provider]  nortriptyline (PAMELOR) 10 MG capsule Take 50 mg by mouth. 08/18/21  Yes [provider]  pregabalin (LYRICA) 50 MG capsule Take 50 mg by mouth 2 (two) times daily. 08/18/21  Yes [provider]  Propylene Glycol (SYSTANE BALANCE) 0.6 % SOLN Apply 1 drop to eye 2 (two) times daily as needed (dry eyes).   Yes [provider]  simvastatin (ZOCOR) 40 MG tablet Take 40 mg by mouth daily.   Yes [provider]  VITAMIN A PO Take 1 tablet by mouth daily.   Yes [provider]  losartan (COZAAR) 100 MG tablet Take 100 mg by mouth daily. 11/10/21 08/16/23  [provider]  pantoprazole (PROTONIX) 40 MG tablet Take 40 mg by mouth daily as needed (heartburn).  01/09/17   [provider]    Allergies as of 07/11/2023 - Review Complete 04/23/2023  Allergen Reaction Noted   Amoxil [amoxicillin] Nausea And Vomiting 01/15/2016   Aspirin buf(cacarb-mgcarb-mgo)  01/15/2016   Aspirin buffered  09/29/2013    Family History  Problem Relation Age of Onset   Ovarian cancer Mother    Ulcers Father     Social History   Socioeconomic History   Marital status: Widowed    Spouse name: Not on file   Number of children: Not on file   Years of education: Not on file   Highest education level: Not on file  Occupational History   Not on file  Tobacco Use   Smoking status: Never   Smokeless tobacco: Never  Vaping Use   Vaping status: Never Used  Substance and Sexual Activity   Alcohol use: No   Drug use: No   Sexual activity: Not on file  Other Topics Concern   Not on file  Social History Narrative   Not on file   Social Drivers of Health   Financial  Resource Strain: Low Risk  (11/02/2022)   Received from [facility]   Overall Financial Resource Strain (CARDIA)    Difficulty of Paying Living Expenses: Not hard at all  Food Insecurity: No Food Insecurity (11/02/2022)   [facility] from [facility]   Hunger Vital Sign    Worried About Running Out of Food in the Last Year: Never true    Ran Out of Food in the Last Year: Never true  Transportation Needs: No Transportation Needs (11/02/2022)   Received from [place] - Transportation    In the past 12 months, has lack of transportation kept you from medical appointments or from getting medications?: No    Lack of Transportation (Non-Medical): No  Physical Activity:  Not on file  Stress: Not on file  Social Connections: Not on file  Intimate Partner Violence: Not on file    Review of Systems: See HPI, otherwise negative ROS  Physical Exam: BP 132/75   Pulse 79   Resp 18   Ht 5\' 7"  (1.702 m)   Wt 96.6 kg   SpO2 97%   BMI 33.36 kg/m  General:   Alert, cooperative. Head:  Normocephalic and atraumatic. Respiratory:  Normal work of breathing. Cardiovascular:  NAD  Impression/Plan: Bobby Chen is here for cataract surgery.  Risks, benefits, limitations, and alternatives regarding cataract surgery have been reviewed with the patient.  Questions have been answered.  All parties agreeable.   Dusty Gin, MD  09/25/2023, 9:19 AM

## 2023-09-25 NOTE — Anesthesia Postprocedure Evaluation (Signed)
 Anesthesia Post Note  Patient: Bobby Chen  Procedure(s) Performed: EXTRACTION, CATARACT, WITH IOL AND MICRO STENT INSERTION 3.35 00:22.1 (Left: Eye) GONIOTOMY (Left: Eye)  Patient location during evaluation: PACU Anesthesia Type: MAC Level of consciousness: awake and alert Pain management: pain level controlled Vital Signs Assessment: post-procedure vital signs reviewed and stable Respiratory status: spontaneous breathing, nonlabored ventilation, respiratory function stable and patient connected to nasal cannula oxygen Cardiovascular status: stable and blood pressure returned to baseline Postop Assessment: no apparent nausea or vomiting Anesthetic complications: no   No notable events documented.   Last Vitals:  Vitals:   09/25/23 0952 09/25/23 0956  BP: 114/68 128/73  Pulse: 86 88  Resp: 18 19  Temp: (!) 36.1 C (!) 36.1 C  SpO2: 95% 95%    Last Pain:  Vitals:   09/25/23 0956  PainSc: 0-No pain                 Braeden Kennan C Maxwell Martorano

## 2023-10-22 ENCOUNTER — Emergency Department

## 2023-10-22 ENCOUNTER — Other Ambulatory Visit: Payer: Self-pay

## 2023-10-22 ENCOUNTER — Inpatient Hospital Stay
Admission: EM | Admit: 2023-10-22 | Discharge: 2023-10-30 | DRG: 641 | Disposition: A | Discharge status: Skilled Nursing Facility | Attending: Internal Medicine | Admitting: Internal Medicine

## 2023-10-22 DIAGNOSIS — G4733 Obstructive sleep apnea (adult) (pediatric): Secondary | ICD-10-CM | POA: Diagnosis present

## 2023-10-22 DIAGNOSIS — E860 Dehydration: Secondary | ICD-10-CM | POA: Diagnosis not present

## 2023-10-22 DIAGNOSIS — R42 Dizziness and giddiness: Secondary | ICD-10-CM | POA: Diagnosis present

## 2023-10-22 DIAGNOSIS — E669 Obesity, unspecified: Secondary | ICD-10-CM | POA: Insufficient documentation

## 2023-10-22 DIAGNOSIS — I2510 Atherosclerotic heart disease of native coronary artery without angina pectoris: Secondary | ICD-10-CM | POA: Diagnosis present

## 2023-10-22 DIAGNOSIS — J449 Chronic obstructive pulmonary disease, unspecified: Secondary | ICD-10-CM | POA: Diagnosis present

## 2023-10-22 DIAGNOSIS — E119 Type 2 diabetes mellitus without complications: Secondary | ICD-10-CM

## 2023-10-22 DIAGNOSIS — E66811 Obesity, class 1: Secondary | ICD-10-CM | POA: Diagnosis present

## 2023-10-22 DIAGNOSIS — M4802 Spinal stenosis, cervical region: Secondary | ICD-10-CM | POA: Diagnosis present

## 2023-10-22 DIAGNOSIS — Z7985 Long-term (current) use of injectable non-insulin antidiabetic drugs: Secondary | ICD-10-CM

## 2023-10-22 DIAGNOSIS — E1122 Type 2 diabetes mellitus with diabetic chronic kidney disease: Secondary | ICD-10-CM | POA: Diagnosis present

## 2023-10-22 DIAGNOSIS — Z7984 Long term (current) use of oral hypoglycemic drugs: Secondary | ICD-10-CM

## 2023-10-22 DIAGNOSIS — R197 Diarrhea, unspecified: Secondary | ICD-10-CM | POA: Diagnosis present

## 2023-10-22 DIAGNOSIS — F329 Major depressive disorder, single episode, unspecified: Secondary | ICD-10-CM | POA: Diagnosis present

## 2023-10-22 DIAGNOSIS — I739 Peripheral vascular disease, unspecified: Secondary | ICD-10-CM | POA: Diagnosis present

## 2023-10-22 DIAGNOSIS — A029 Salmonella infection, unspecified: Secondary | ICD-10-CM | POA: Diagnosis present

## 2023-10-22 DIAGNOSIS — Z8673 Personal history of transient ischemic attack (TIA), and cerebral infarction without residual deficits: Secondary | ICD-10-CM

## 2023-10-22 DIAGNOSIS — E1141 Type 2 diabetes mellitus with diabetic mononeuropathy: Secondary | ICD-10-CM | POA: Diagnosis present

## 2023-10-22 DIAGNOSIS — N400 Enlarged prostate without lower urinary tract symptoms: Secondary | ICD-10-CM | POA: Diagnosis present

## 2023-10-22 DIAGNOSIS — Z6833 Body mass index (BMI) 33.0-33.9, adult: Secondary | ICD-10-CM

## 2023-10-22 DIAGNOSIS — Z7982 Long term (current) use of aspirin: Secondary | ICD-10-CM

## 2023-10-22 DIAGNOSIS — I10 Essential (primary) hypertension: Secondary | ICD-10-CM | POA: Diagnosis present

## 2023-10-22 DIAGNOSIS — E1151 Type 2 diabetes mellitus with diabetic peripheral angiopathy without gangrene: Secondary | ICD-10-CM | POA: Diagnosis present

## 2023-10-22 DIAGNOSIS — I129 Hypertensive chronic kidney disease with stage 1 through stage 4 chronic kidney disease, or unspecified chronic kidney disease: Secondary | ICD-10-CM | POA: Diagnosis present

## 2023-10-22 DIAGNOSIS — E785 Hyperlipidemia, unspecified: Secondary | ICD-10-CM | POA: Diagnosis present

## 2023-10-22 DIAGNOSIS — W19XXXA Unspecified fall, initial encounter: Secondary | ICD-10-CM | POA: Diagnosis present

## 2023-10-22 DIAGNOSIS — N1831 Chronic kidney disease, stage 3a: Secondary | ICD-10-CM | POA: Diagnosis present

## 2023-10-22 DIAGNOSIS — Z79899 Other long term (current) drug therapy: Secondary | ICD-10-CM

## 2023-10-22 DIAGNOSIS — E876 Hypokalemia: Secondary | ICD-10-CM | POA: Diagnosis present

## 2023-10-22 DIAGNOSIS — G8929 Other chronic pain: Secondary | ICD-10-CM | POA: Diagnosis present

## 2023-10-22 LAB — CBC WITH DIFFERENTIAL/PLATELET
Abs Immature Granulocytes: 0.05 10*3/uL (ref 0.00–0.07)
Basophils Absolute: 0.1 10*3/uL (ref 0.0–0.1)
Basophils Relative: 0 %
Eosinophils Absolute: 0 10*3/uL (ref 0.0–0.5)
Eosinophils Relative: 0 %
HCT: 41.2 % (ref 39.0–52.0)
Hemoglobin: 13.6 g/dL (ref 13.0–17.0)
Immature Granulocytes: 0 %
Lymphocytes Relative: 7 %
Lymphs Abs: 0.9 10*3/uL (ref 0.7–4.0)
MCH: 30.6 pg (ref 26.0–34.0)
MCHC: 33 g/dL (ref 30.0–36.0)
MCV: 92.8 fL (ref 80.0–100.0)
Monocytes Absolute: 1 10*3/uL (ref 0.1–1.0)
Monocytes Relative: 9 %
Neutro Abs: 9.8 10*3/uL — ABNORMAL HIGH (ref 1.7–7.7)
Neutrophils Relative %: 84 %
Platelets: 196 10*3/uL (ref 150–400)
RBC: 4.44 MIL/uL (ref 4.22–5.81)
RDW: 12.3 % (ref 11.5–15.5)
WBC: 11.8 10*3/uL — ABNORMAL HIGH (ref 4.0–10.5)
nRBC: 0 % (ref 0.0–0.2)

## 2023-10-22 LAB — URINALYSIS, W/ REFLEX TO CULTURE (INFECTION SUSPECTED)
Bacteria, UA: NONE SEEN
Bilirubin Urine: NEGATIVE
Glucose, UA: >=500 mg/dL — AB
Hgb urine dipstick: NEGATIVE
Ketones, ur: 5 mg/dL — AB
Leukocytes,Ua: NEGATIVE
Nitrite: NEGATIVE
Protein, ur: NEGATIVE mg/dL
Specific Gravity, Urine: >1.046 — ABNORMAL HIGH (ref 1.005–1.030)
pH: 5 (ref 5.0–8.0)

## 2023-10-22 LAB — COMPREHENSIVE METABOLIC PANEL WITH GFR
ALT: 16 U/L (ref 0–44)
AST: 22 U/L (ref 15–41)
Albumin: 3.7 g/dL (ref 3.5–5.0)
Alkaline Phosphatase: 64 U/L (ref 38–126)
Anion gap: 10 (ref 5–15)
BUN: 15 mg/dL (ref 8–23)
CO2: 24 mmol/L (ref 22–32)
Calcium: 8.7 mg/dL — ABNORMAL LOW (ref 8.9–10.3)
Chloride: 99 mmol/L (ref 98–111)
Creatinine, Ser: 1.33 mg/dL — ABNORMAL HIGH (ref 0.61–1.24)
GFR, Estimated: 55 mL/min — ABNORMAL LOW (ref >60)
Glucose, Bld: 138 mg/dL — ABNORMAL HIGH (ref 70–99)
Potassium: 3.5 mmol/L (ref 3.5–5.1)
Sodium: 133 mmol/L — ABNORMAL LOW (ref 135–145)
Total Bilirubin: 0.9 mg/dL (ref 0.0–1.2)
Total Protein: 6.6 g/dL (ref 6.5–8.1)

## 2023-10-22 LAB — LIPASE, BLOOD: Lipase: 30 U/L (ref 11–51)

## 2023-10-22 MED ORDER — IOHEXOL 350 MG/ML SOLN
100.0000 mL | Freq: Once | INTRAVENOUS | Status: AC | PRN
Start: 2023-10-22 — End: 2023-10-22
  Administered 2023-10-22: 100 mL via INTRAVENOUS

## 2023-10-22 NOTE — ED Provider Notes (Signed)
 [facility] Provider Note    Event Date/Time   First MD Initiated Contact with Patient 10/22/23 1908     (approximate)   History   Chief Complaint: Weakness   HPI  Bobby Chen is a 77 y.o. male with a history of COPD diabetes hypertension who comes ED complaining of weakness and lightheadedness while sitting in his truck today.  He had just gotten into [employer] and had 2 cups of water because he was feeling dehydrated.  Back outside at his truck, he felt a generalized fatigue, causing him to fall to the ground on his buttocks without loss of consciousness or head injury.  He also had an episode of vomiting.  Complains of pain in the right lower quadrant since that time.  No back pain.        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, LEFT ARM    COPD (chronic obstructive pulmonary disease) (HCC)    Coronary artery disease involving native coronary artery of native heart without angina pectoris    Diabetes mellitus without complication (HCC)    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HCC)    Elevated lipids    Erosive esophagitis    Hypertension    Mild pulmonary hypertension (HCC)    Obesity    OSA on CPAP    Pulmonary arterial hypertension (HCC)    Reflux esophagitis    Sleep apnea    CPAP   Type 2 diabetes mellitus with stage 3a chronic kidney disease, without long-term current use of insulin (HCC)    Vertigo    No issues since crystals resettled    Current Outpatient Rx   Order #: [id number] Class: Historical Med   Order #: [id number] Class: Historical Med   Order #: [id number] Class: Historical Med   Order #: [id number] Class: Historical Med   Order #: [id number] Class: Historical Med   Order #: [id number] Class: Historical Med   Order #: [id number] Class: Historical Med   Order #: [id number] Class: Historical Med   Order #: [id number] Class:  Historical Med   Order #: [id number] Class: Historical Med   Order #: [id number] Class: Normal   Order #: [id number] Class: Historical Med   Order #: [id number] Class: Historical Med   Order #: [id number] Class: Historical Med   Order #: [id number] Class: Historical Med   Order #: [id number] Class: Historical Med   Order #: [id number] Class: Historical Med   Order #: [id number] Class: Historical Med   Order #: [id number] Class: Historical Med   Order #: [id number] Class: Historical Med   Order #: [id number] Class: Historical Med   Order #: [id number] Class: Historical Med    Past Surgical History:  Procedure Laterality Date   ANKLE FRACTURE SURGERY     BASAL CELL CARCINOMA EXCISION N/A    scalp   COLONOSCOPY W/ POLYPECTOMY     COLONOSCOPY WITH PROPOFOL  N/A 01/18/2016   Procedure: COLONOSCOPY WITH PROPOFOL ;  Surgeon: Gladis RAYMOND Mariner, MD;  Location: [facility] ENDOSCOPY;  Service: Endoscopy;  Laterality: N/A;   COLONOSCOPY WITH PROPOFOL  N/A 07/02/2018   Procedure: COLONOSCOPY WITH PROPOFOL ;  Surgeon: Mariner Gladis RAYMOND, MD;  Location: [facility] ENDOSCOPY;  Service: Endoscopy;  Laterality: N/A;   ESOPHAGOGASTRODUODENOSCOPY (EGD) WITH PROPOFOL  N/A 01/18/2016   Procedure: ESOPHAGOGASTRODUODENOSCOPY (EGD) WITH PROPOFOL ;  Surgeon: Gladis RAYMOND Mariner, MD;  Location: [facility] ENDOSCOPY;  Service: Endoscopy;  Laterality: N/A;   EXTRACTION, CATARACT, WITH IOL AND MICRO STENT INSERTION Right 08/21/2023  Procedure: EXTRACTION, CATARACT, WITH IOL AND MICRO STENT INSERTION 5.85 00:37.9;  Surgeon: Myrna Adine Anes, MD;  Location: [facility]ERY [facility];  Service: Ophthalmology;  Laterality: Right;   EXTRACTION, CATARACT, WITH IOL AND MICRO STENT INSERTION Left 09/25/2023   Procedure: EXTRACTION, CATARACT, WITH IOL AND MICRO STENT INSERTION 3.35 00:22.1;  Surgeon: Myrna Adine Anes, MD;  Location: [facility];  Service: Ophthalmology;  Laterality: Left;   FRACTURE SURGERY Right 1984   ankle   GONIOTOMY Left 09/25/2023   Procedure:  GONIOTOMY;  Surgeon: Myrna Adine Anes, MD;  Location: [facility];  Service: Ophthalmology;  Laterality: Left;   HERNIA REPAIR     UMBILICAL   LEG SURGERY Left    NASAL SEPTOPLASTY W/ TURBINOPLASTY Bilateral 11/06/2019   Procedure: NASAL SEPTOPLASTY WITH INFERIOR TURBINATE REDUCTION;  Surgeon: Edda Mt, MD;  Location: [facility] ORS;  Service: ENT;  Laterality: Bilateral;   toenail removal Bilateral     Physical Exam   Triage Vital Signs: ED Triage Vitals  Encounter Vitals Group     BP 10/22/23 1904 130/72     Girls Systolic BP Percentile --      Girls Diastolic BP Percentile --      Boys Systolic BP Percentile --      Boys Diastolic BP Percentile --      Pulse Rate 10/22/23 1904 94     Resp 10/22/23 1904 20     Temp 10/22/23 1904 98.5 F (36.9 C)     Temp Source 10/22/23 1904 Oral     SpO2 10/22/23 1901 100 %     Weight 10/22/23 1903 220 lb (99.8 kg)     Height 10/22/23 1903 5' 7 (1.702 m)     Head Circumference --      Peak Flow --      Pain Score 10/22/23 1903 0     Pain Loc --      Pain Education --      Exclude from Growth Chart --     Most recent vital signs: Vitals:   10/22/23 2100 10/22/23 2230  BP: 110/83 115/72  Pulse: 99 100  Resp: (!) 26 15  Temp:    SpO2: 96% 95%    General: Awake, no distress.  CV:  Good peripheral perfusion.  Regular rate and rhythm Resp:  Normal effort..  Clear to auscultation bilaterally Abd:  No distention.  Soft with right lower quadrant tenderness Other:  Moist oral mucosa.  Head atraumatic.   ED Results / Procedures / Treatments   Labs (all labs ordered are listed, but only abnormal results are displayed) Labs Reviewed  COMPREHENSIVE METABOLIC PANEL WITH GFR - Abnormal; Notable for the following components:      Result Value   Sodium 133 (*)    Glucose, Bld 138 (*)    Creatinine, Ser 1.33 (*)    Calcium 8.7 (*)    GFR, Estimated 55 (*)    All other components within normal limits  CBC WITH  DIFFERENTIAL/PLATELET - Abnormal; Notable for the following components:   WBC 11.8 (*)    Neutro Abs 9.8 (*)    All other components within normal limits  URINALYSIS, W/ REFLEX TO CULTURE (INFECTION SUSPECTED) - Abnormal; Notable for the following components:   Color, Urine YELLOW (*)    APPearance CLEAR (*)    Specific Gravity, Urine >1.046 (*)    Glucose, UA >=500 (*)    Ketones, ur 5 (*)    All other components within normal limits  LIPASE, BLOOD     EKG Interpreted by me Sinus rhythm rate of 94.  Normal axis, normal intervals.  Normal QRS ST segments and T waves.   RADIOLOGY CT abdomen pelvis interpreted by me, no obvious acute findings.  Radiology report reviewed   PROCEDURES:  Procedures   MEDICATIONS ORDERED IN ED: Medications  iohexol  (OMNIPAQUE ) 350 MG/ML injection 100 mL (100 mLs Intravenous Contrast Given 10/22/23 2024)     IMPRESSION / MDM / ASSESSMENT AND PLAN / ED COURSE  I reviewed the triage vital signs and the nursing notes.  DDx: Dehydration, AKI, electrolyte derangement, anemia, ileus, appendicitis, diverticulitis, UTI, kidney stone, heat exposure  Patient's presentation is most consistent with acute presentation with potential threat to life or bodily function.  Patient presents with right lower quadrant pain and vomiting that started today.  Did have a fall as well which seems to be more related to dehydration and heat exposure.  Doubt syncope, arrhythmia, stroke, seizure.  Will check labs, CT abdomen pelvis.   Clinical Course as of 10/22/23 2335  [place] Oct 22, 2023  2333 Workup reassuring, shows some signs of dehydration.  Vital signs unremarkable, CT abdomen pelvis reassuring.  Stable for discharge. [PS]    Clinical Course User Index [PS] Viviann Pastor, MD     FINAL CLINICAL IMPRESSION(S) / ED DIAGNOSES   Final diagnoses:  Dehydration  Chronic obstructive pulmonary disease, unspecified COPD type (HCC)  Type 2 diabetes mellitus without  complication, without long-term current use of insulin (HCC)     Rx / DC Orders   ED Discharge Orders     None        Note:  This document was prepared using Dragon voice recognition software and may include unintentional dictation errors.   Viviann Pastor, MD 10/22/23 [phone]

## 2023-10-22 NOTE — ED Triage Notes (Addendum)
 Patient to ED via [facility] from home. Patient states he was sitting in his truck when he became very weak and when exiting his truck, he fell on the ground. Patient states he landed on his buttocks and did not hit his head. Patient did not have LOC and denies blood thinner use. With EMS, patient had a large episode of vomiting. Patient states his LBM was 2 nights ago and it was diarrhea. Patient has pain in his RLQ and his abdomen is distended. Patient has history of Type 2 diabetes. CBG with EMS was 133. EMS Vitals: 129/87 90 HR 100% room air

## 2023-10-22 NOTE — ED Notes (Signed)
Patient assisted to use urinal at this time.

## 2023-10-23 ENCOUNTER — Observation Stay

## 2023-10-23 ENCOUNTER — Emergency Department

## 2023-10-23 ENCOUNTER — Encounter: Payer: Self-pay | Admitting: Obstetrics and Gynecology

## 2023-10-23 DIAGNOSIS — E669 Obesity, unspecified: Secondary | ICD-10-CM | POA: Insufficient documentation

## 2023-10-23 DIAGNOSIS — R42 Dizziness and giddiness: Secondary | ICD-10-CM | POA: Diagnosis not present

## 2023-10-23 DIAGNOSIS — N1831 Chronic kidney disease, stage 3a: Secondary | ICD-10-CM | POA: Insufficient documentation

## 2023-10-23 LAB — HEMOGLOBIN A1C
Hgb A1c MFr Bld: 6.6 % — ABNORMAL HIGH (ref 4.8–5.6)
Mean Plasma Glucose: 142.72 mg/dL

## 2023-10-23 LAB — VITAMIN B12: Vitamin B-12: 392 pg/mL (ref 180–914)

## 2023-10-23 LAB — GLUCOSE, CAPILLARY
Glucose-Capillary: 142 mg/dL — ABNORMAL HIGH (ref 70–99)
Glucose-Capillary: 160 mg/dL — ABNORMAL HIGH (ref 70–99)

## 2023-10-23 LAB — CK: Total CK: 221 U/L (ref 49–397)

## 2023-10-23 LAB — TSH: TSH: 0.744 u[IU]/mL (ref 0.350–4.500)

## 2023-10-23 MED ORDER — MECLIZINE HCL 25 MG PO TABS
25.0000 mg | ORAL_TABLET | Freq: Once | ORAL | Status: AC
  Administered 2023-10-23: 25 mg via ORAL
  Filled 2023-10-23: qty 1

## 2023-10-23 MED ORDER — INSULIN ASPART 100 UNIT/ML IJ SOLN
0.0000 [IU] | Freq: Three times a day (TID) | INTRAMUSCULAR | Status: DC
  Administered 2023-10-23: 2 [IU] via SUBCUTANEOUS
  Administered 2023-10-24: 3 [IU] via SUBCUTANEOUS
  Administered 2023-10-24: 2 [IU] via SUBCUTANEOUS
  Administered 2023-10-25: 3 [IU] via SUBCUTANEOUS
  Administered 2023-10-25 – 2023-10-26 (×2): 2 [IU] via SUBCUTANEOUS
  Administered 2023-10-27: 3 [IU] via SUBCUTANEOUS
  Administered 2023-10-27: 2 [IU] via SUBCUTANEOUS
  Administered 2023-10-28 – 2023-10-29 (×2): 3 [IU] via SUBCUTANEOUS
  Administered 2023-10-29 – 2023-10-30 (×2): 2 [IU] via SUBCUTANEOUS
  Administered 2023-10-30: 3 [IU] via SUBCUTANEOUS
  Filled 2023-10-23 (×14): qty 1

## 2023-10-23 MED ORDER — BRIMONIDINE TARTRATE-TIMOLOL 0.2-0.5 % OP SOLN: 1.0000 [drp] | Freq: Two times a day (BID) | OPHTHALMIC | Status: DC

## 2023-10-23 MED ORDER — PREGABALIN 50 MG PO CAPS
50.0000 mg | ORAL_CAPSULE | Freq: Two times a day (BID) | ORAL | Status: DC
  Administered 2023-10-23 – 2023-10-30 (×15): 50 mg via ORAL
  Filled 2023-10-23 (×15): qty 1

## 2023-10-23 MED ORDER — BRIMONIDINE TARTRATE 0.2 % OP SOLN
1.0000 [drp] | Freq: Two times a day (BID) | OPHTHALMIC | Status: DC
  Administered 2023-10-23 – 2023-10-30 (×14): 1 [drp] via OPHTHALMIC
  Filled 2023-10-23 (×2): qty 5

## 2023-10-23 MED ORDER — CITALOPRAM HYDROBROMIDE 10 MG PO TABS
10.0000 mg | ORAL_TABLET | Freq: Every day | ORAL | Status: DC
Start: 2023-10-23 — End: 2023-10-30
  Administered 2023-10-23 – 2023-10-30 (×8): 10 mg via ORAL
  Filled 2023-10-23 (×8): qty 1

## 2023-10-23 MED ORDER — ASPIRIN 81 MG PO TBEC
81.0000 mg | DELAYED_RELEASE_TABLET | Freq: Every day | ORAL | Status: DC
  Administered 2023-10-23 – 2023-10-30 (×8): 81 mg via ORAL
  Filled 2023-10-23 (×9): qty 1

## 2023-10-23 MED ORDER — LOSARTAN POTASSIUM 50 MG PO TABS: 100.0000 mg | ORAL_TABLET | Freq: Every day | ORAL | Status: DC

## 2023-10-23 MED ORDER — AMLODIPINE BESYLATE 5 MG PO TABS: 5.0000 mg | ORAL_TABLET | Freq: Every day | ORAL | Status: DC

## 2023-10-23 MED ORDER — IOHEXOL 350 MG/ML SOLN
75.0000 mL | Freq: Once | INTRAVENOUS | Status: AC | PRN
Start: 2023-10-23 — End: 2023-10-23
  Administered 2023-10-23: 75 mL via INTRAVENOUS

## 2023-10-23 MED ORDER — INSULIN ASPART 100 UNIT/ML IJ SOLN: 0.0000 [IU] | Freq: Every day | INTRAMUSCULAR | Status: DC

## 2023-10-23 MED ORDER — ENOXAPARIN SODIUM 40 MG/0.4ML IJ SOSY
40.0000 mg | PREFILLED_SYRINGE | INTRAMUSCULAR | Status: DC
  Administered 2023-10-23 – 2023-10-29 (×7): 40 mg via SUBCUTANEOUS
  Filled 2023-10-23 (×7): qty 0.4

## 2023-10-23 MED ORDER — TIMOLOL MALEATE 0.5 % OP SOLN
1.0000 [drp] | Freq: Two times a day (BID) | OPHTHALMIC | Status: DC
  Administered 2023-10-23 – 2023-10-30 (×14): 1 [drp] via OPHTHALMIC
  Filled 2023-10-23 (×2): qty 5

## 2023-10-23 MED ORDER — ATORVASTATIN CALCIUM 20 MG PO TABS
40.0000 mg | ORAL_TABLET | Freq: Every day | ORAL | Status: DC
  Administered 2023-10-23 – 2023-10-29 (×7): 40 mg via ORAL
  Filled 2023-10-23 (×7): qty 2

## 2023-10-23 MED ORDER — PANTOPRAZOLE SODIUM 40 MG PO TBEC: 40.0000 mg | DELAYED_RELEASE_TABLET | Freq: Every day | ORAL | Status: DC | PRN

## 2023-10-23 MED ORDER — ORAL CARE MOUTH RINSE: 15.0000 mL | OROMUCOSAL | Status: DC | PRN

## 2023-10-23 NOTE — ED Notes (Signed)
 Patient unable to sit up on side of bed, states he is feeling dizzy and like the room is spinning while trying to get upright.

## 2023-10-23 NOTE — ED Notes (Signed)
 Patient cleaned up after an episode of bowel incontinence. A complete linen change was done and patient was place in a new gown. Bed returned to lowest position with call light in reach.

## 2023-10-23 NOTE — Progress Notes (Signed)
 Pt asked if his sons and DIL were acceptable to discuss his condition and care.  Pt was agreeable, allowing this RN permission to discuss his case and care to any of the pt's sons and the DIL.  Contact information was added to the chart.

## 2023-10-23 NOTE — ED Provider Notes (Signed)
-----------------------------------------   9:10 AM on 10/23/2023 ----------------------------------------- Patient seen in the ED yesterday evening for weakness and lightheadedness, workup at that time was unremarkable.  He was awaiting discharge home and arrival of his ride this morning, but has been unable to ambulate due to dizziness.  He states it feels like the room is spinning when he tries to stand up, we will check MRI of his brain to rule out stroke and give dose of meclizine for possible peripheral vertigo.  ----------------------------------------- 12:19 PM on 10/23/2023 ----------------------------------------- MRI brain shows old infarcts but no evidence of acute infarct to explain his dizziness.  Patient with no improvement in his dizziness following dose of meclizine, is still unable to ambulate.  Case discussed with hospitalist for admission.   Willo Dunnings, MD 10/23/23 [phone]

## 2023-10-23 NOTE — H&P (Signed)
 History and Physical    Bobby Chen FMW:[id number] DOB: 1945/12/20 DOA: 10/22/2023  PCP: Lenon Layman ORN, MD  Patient coming from: home   Chief Complaint: weakness, dizziness  HPI: Bobby Chen is a 78 y.o. male with medical history significant for obesity, htn, pad, spinal stenosis, t2dm, ckd 3a, who presents with the above.  Was in his usual state of health, went to [employer], when going back to his truck developed weakness, mainly in lower extremities, and dizziness which he describes as room spinning. The room spinning happens when he gets up. No muscle pain. Able to urinate, no numbness. No recent trauma. Watery diarrhea started yesterday, denies recent antibiotics.   ED Course:   Unable to ambulate so called for admission  Review of Systems: As per HPI otherwise 10 point review of systems negative.    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, LEFT ARM    COPD (chronic obstructive pulmonary disease) (HCC)    Coronary artery disease involving native coronary artery of native heart without angina pectoris    Diabetes mellitus without complication (HCC)    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HCC)    Elevated lipids    Erosive esophagitis    Hypertension    Mild pulmonary hypertension (HCC)    Obesity    OSA on CPAP    Pulmonary arterial hypertension (HCC)    Reflux esophagitis    Sleep apnea    CPAP   Type 2 diabetes mellitus with stage 3a chronic kidney disease, without long-term current use of insulin (HCC)    Vertigo    No issues since crystals resettled    Past Surgical History:  Procedure Laterality Date   ANKLE FRACTURE SURGERY     BASAL CELL CARCINOMA EXCISION N/A    scalp   COLONOSCOPY W/ POLYPECTOMY     COLONOSCOPY WITH PROPOFOL  N/A 01/18/2016   Procedure: COLONOSCOPY WITH PROPOFOL ;  Surgeon: Gladis RAYMOND Mariner, MD;  Location: [facility] ENDOSCOPY;   Service: Endoscopy;  Laterality: N/A;   COLONOSCOPY WITH PROPOFOL  N/A 07/02/2018   Procedure: COLONOSCOPY WITH PROPOFOL ;  Surgeon: Mariner Gladis RAYMOND, MD;  Location: [facility] ENDOSCOPY;  Service: Endoscopy;  Laterality: N/A;   ESOPHAGOGASTRODUODENOSCOPY (EGD) WITH PROPOFOL  N/A 01/18/2016   Procedure: ESOPHAGOGASTRODUODENOSCOPY (EGD) WITH PROPOFOL ;  Surgeon: Gladis RAYMOND Mariner, MD;  Location: [facility] ENDOSCOPY;  Service: Endoscopy;  Laterality: N/A;   EXTRACTION, CATARACT, WITH IOL AND MICRO STENT INSERTION Right 08/21/2023   Procedure: EXTRACTION, CATARACT, WITH IOL AND MICRO STENT INSERTION 5.85 00:37.9;  Surgeon: Myrna Adine Anes, MD;  Location: [facility] SURGERY CNTR;  Service: Ophthalmology;  Laterality: Right;   EXTRACTION, CATARACT, WITH IOL AND MICRO STENT INSERTION Left 09/25/2023   Procedure: EXTRACTION, CATARACT, WITH IOL AND MICRO STENT INSERTION 3.35 00:22.1;  Surgeon: Myrna Adine Anes, MD;  Location: [facility] SURGERY CNTR;  Service: Ophthalmology;  Laterality: Left;   FRACTURE SURGERY Right 1984   ankle   GONIOTOMY Left 09/25/2023   Procedure: GONIOTOMY;  Surgeon: Myrna Adine Anes, MD;  Location: [facility] SURGERY [facility]TR;  Service: Ophthalmology;  Laterality: Left;   HERNIA REPAIR     UMBILICAL   LEG SURGERY Left    NASAL SEPTOPLASTY W/ TURBINOPLASTY Bilateral 11/06/2019   Procedure: NASAL SEPTOPLASTY WITH INFERIOR TURBINATE REDUCTION;  Surgeon: Edda Mt, MD;  Location: [facility] ORS;  Service: ENT;  Laterality: Bilateral;   toenail removal  Bilateral      reports that he has never smoked. He has never used smokeless tobacco. He reports that he does not drink alcohol and does not use drugs.  Allergies  Allergen Reactions   Amoxil [Amoxicillin] Nausea And Vomiting   Aspirin Buf(Cacarb-Mgcarb-Mgo)     knots on the back of head   Aspirin Buffered     Other reaction(s): Other (See Comments)    Family History  Problem Relation Age of Onset   Ovarian cancer Mother    Ulcers Father     Prior  to Admission medications   Medication Sig Start Date End Date Taking? Authorizing Provider  JARDIANCE 10 MG TABS tablet Take 10 mg by mouth daily. 10/16/23  Yes [provider]  MOUNJARO 7.5 MG/0.5ML Pen Inject 7.5 mg into the skin.  Inject 0.5 mLs (7.5 mg total) subcutaneously once a week 08/31/23  Yes [provider]  acetaminophen  (TYLENOL ) 500 MG tablet Take 500 mg by mouth every 6 (six) hours as needed.    [provider]  albuterol (PROVENTIL HFA;VENTOLIN HFA) 108 (90 Base) MCG/ACT inhaler Inhale 2 puffs into the lungs every 4 (four) hours as needed for wheezing or shortness of breath.    [provider]  amLODipine (NORVASC) 5 MG tablet Take 5 mg by mouth daily. 03/14/22   [provider]  atorvastatin (LIPITOR) 40 MG tablet Take 1 tablet by mouth at bedtime. 07/04/22   [provider]  brimonidine-timolol (COMBIGAN) 0.2-0.5 % ophthalmic solution Place 1 drop into both eyes every 12 (twelve) hours. 01/29/22   [provider]  chlorhexidine  (PERIDEX ) 0.12 % solution SMARTSIG:By Mouth 07/01/21   [provider]  citalopram (CELEXA) 10 MG tablet Take 1 tablet by mouth daily. 11/06/20   [provider]  Cyanocobalamin (VITAMIN B-12 PO) Take by mouth daily.    [provider]  cyclobenzaprine (FLEXERIL) 10 MG tablet Take 10 mg by mouth 3 (three) times daily as needed.    [provider]  Dulaglutide (TRULICITY) 3 MG/0.5ML SOPN Inject into the skin once a week.    [provider]  fluticasone  (FLONASE ) 50 MCG/ACT nasal spray Place 2 sprays into both nostrils daily. 05/31/18   Lacinda Elsie SQUIBB, PA-C  Garlic 10 MG CAPS Take by mouth daily.    [provider]  glipiZIDE (GLUCOTROL) 10 MG tablet Take 10 mg by mouth daily before breakfast.    [provider]  losartan (COZAAR) 100 MG tablet Take 100 mg by mouth daily. 11/10/21 08/16/23  [provider]  metFORMIN (GLUCOPHAGE-XR)  500 MG 24 hr tablet Take 1,000 mg by mouth 2 (two) times daily.     [provider]  montelukast (SINGULAIR) 10 MG tablet  12/10/20   [provider]  nortriptyline (PAMELOR) 10 MG capsule Take 50 mg by mouth. 08/18/21   [provider]  pantoprazole (PROTONIX) 40 MG tablet Take 40 mg by mouth daily as needed (heartburn).  01/09/17   [provider]  pregabalin (LYRICA) 50 MG capsule Take 50 mg by mouth 2 (two) times daily. 08/18/21   [provider]  Propylene Glycol (SYSTANE BALANCE) 0.6 % SOLN Apply 1 drop to eye 2 (two) times daily as needed (dry eyes).    [provider]  simvastatin (ZOCOR) 40 MG tablet Take 40 mg by mouth daily.    [provider]  VITAMIN A PO Take 1 tablet by mouth daily.    [provider]    Physical  Exam: Vitals:   10/23/23 0206 10/23/23 1000 10/23/23 1328 10/23/23 1330  BP: (!) 146/79 (!) 152/88  136/71  Pulse: 96 (!) 102  99  Resp: 20 19  17   Temp: 99.8 F (37.7 C) 98.9 F (37.2 C)  98.6 F (37 C)  TempSrc: Oral Oral  Oral  SpO2: 94% 95%  94%  Weight:   95.8 kg   Height:        Constitutional: No acute distress Head: Atraumatic Eyes: Conjunctiva clear ENM: Moist mucous membranes. Normal dentition.  Neck: Supple Respiratory: Clear to auscultation bilaterally, no wheezing/rales/rhonchi.   Cardiovascular: Regular rate and rhythm. No murmurs/rubs/gallops. Abdomen: Non-tender, non-distended.  obese Musculoskeletal: No joint deformity upper and lower extremities. Normal ROM, no contractures. Normal muscle tone.  Skin: No rashes, lesions, or ulcers.  Extremities: No peripheral edema. Palpable peripheral pulses. Neurologic: Alert, moving all 4 extremities. Mildly decreased strength LEs Psychiatric: some mild confusion   Labs on Admission: I have personally reviewed following labs and imaging studies  CBC: Recent Labs  Lab 10/22/23 1944  WBC 11.8*  NEUTROABS 9.8*  HGB 13.6  HCT  41.2  MCV 92.8  PLT 196   Basic Metabolic Panel: Recent Labs  Lab 10/22/23 1944  NA 133*  K 3.5  CL 99  CO2 24  GLUCOSE 138*  BUN 15  CREATININE 1.33*  CALCIUM 8.7*   GFR: Estimated Creatinine Clearance: 52.1 mL/min (A) (by C-G formula based on SCr of 1.33 mg/dL (H)). Liver Function Tests: Recent Labs  Lab 10/22/23 1944  AST 22  ALT 16  ALKPHOS 64  BILITOT 0.9  PROT 6.6  ALBUMIN 3.7   Recent Labs  Lab 10/22/23 1944  LIPASE 30   No results for input(s): AMMONIA in the last 168 hours. Coagulation Profile: No results for input(s): INR, PROTIME in the last 168 hours. Cardiac Enzymes: No results for input(s): CKTOTAL, CKMB, CKMBINDEX, TROPONINI in the last 168 hours. BNP (last 3 results) No results for input(s): PROBNP in the last 8760 hours. HbA1C: No results for input(s): HGBA1C in the last 72 hours. CBG: No results for input(s): GLUCAP in the last 168 hours. Lipid Profile: No results for input(s): CHOL, HDL, LDLCALC, TRIG, CHOLHDL, LDLDIRECT in the last 72 hours. Thyroid Function Tests: No results for input(s): TSH, T4TOTAL, FREET4, T3FREE, THYROIDAB in the last 72 hours. Anemia Panel: No results for input(s): VITAMINB12, FOLATE, FERRITIN, TIBC, IRON, RETICCTPCT in the last 72 hours. Urine analysis:    Component Value Date/Time   COLORURINE YELLOW (A) 10/22/2023 2213   APPEARANCEUR CLEAR (A) 10/22/2023 2213   LABSPEC >1.046 (H) 10/22/2023 2213   PHURINE 5.0 10/22/2023 2213   GLUCOSEU >=500 (A) 10/22/2023 2213   HGBUR NEGATIVE 10/22/2023 2213   BILIRUBINUR NEGATIVE 10/22/2023 2213   KETONESUR 5 (A) 10/22/2023 2213   PROTEINUR NEGATIVE 10/22/2023 2213   NITRITE NEGATIVE 10/22/2023 2213   LEUKOCYTESUR NEGATIVE 10/22/2023 2213    Radiological Exams on Admission: MR BRAIN WO CONTRAST Result Date: 10/23/2023 CLINICAL DATA:  78 year old male status post fall. Neurologic deficit. Weakness, lightheaded.  EXAM: MRI HEAD WITHOUT CONTRAST TECHNIQUE: Multiplanar, multiecho pulse sequences of the brain and surrounding structures were obtained without intravenous contrast. COMPARISON:  Brain MRI 04/15/2021. FINDINGS: Brain: No restricted diffusion to suggest acute infarction. No midline shift, mass effect, evidence of mass lesion, ventriculomegaly, extra-axial collection or acute intracranial hemorrhage. Cervicomedullary junction and pituitary are within normal limits. Stable cerebral volume since 2022. Moderate patchy and scattered bilateral cerebral white matter T2 and FLAIR  hyperintensity for age, generally stable but involvement of the posterior deep white matter capsules on the right series 11, image 27 is new from the previous MRI. Furthermore, new but chronic appearing encephalomalacia in the right occipital pole indicative of interval right PCA territory infarcts series 11, image 21. No other cortical encephalomalacia identified. No significant chronic cerebral blood products on SWI. Furthermore there are several small but chronic appearing lacunar infarcts in the left thalamus which appear new (series 10, image 14). Brainstem and cerebellum remain negative. Vascular: Major intracranial vascular flow voids are stable. Chronic intracranial artery tortuosity. Skull and upper cervical spine: Negative for age visible cervical spine. Visualized bone marrow signal is within normal limits. Sinuses/Orbits: Postoperative changes to both globes since 2022. Paranasal Visualized paranasal sinuses and mastoids are stable and well aerated. Other: Visible internal auditory structures appear normal. Negative visible scalp and face. IMPRESSION: 1. No acute intracranial abnormality. But multifocal progressed chronic ischemic disease in the brain since 2022: Interval right PCA infarct affecting the occipital pole, small vessel lacunar infarcts in the posterior deep white matter capsules on the right, and medial thalamus on the left.  2. Underlying moderate for age chronic white matter changes also most commonly due to small vessel disease. Electronically Signed   By: VEAR Hurst M.D.   On: 10/23/2023 10:06   CT ABDOMEN PELVIS W CONTRAST Result Date: 10/22/2023 CLINICAL DATA:  Status post fall. EXAM: CT ABDOMEN AND PELVIS WITH CONTRAST TECHNIQUE: Multidetector CT imaging of the abdomen and pelvis was performed using the standard protocol following bolus administration of intravenous contrast. RADIATION DOSE REDUCTION: This exam was performed according to the departmental dose-optimization program which includes automated exposure control, adjustment of the mA and/or kV according to patient size and/or use of iterative reconstruction technique. CONTRAST:  OMNIPAQUE  IOHEXOL  350 MG/ML SOLN COMPARISON:  March 31, 2011 FINDINGS: Lower chest: Mild to moderate severity atelectatic changes are seen within the right lung base. Hepatobiliary: No focal liver abnormality is seen. No gallstones, gallbladder wall thickening, or biliary dilatation. Pancreas: Unremarkable. No pancreatic ductal dilatation or surrounding inflammatory changes. Spleen: Normal in size without focal abnormality. Adrenals/Urinary Tract: Adrenal glands are unremarkable. Kidneys are normal, without obstructing renal calculi, focal lesion, or hydronephrosis. A 1 mm nonobstructing renal calculus is seen within the mid right kidney. Adjacent 2 mm and 3 mm nonobstructing renal calculi are noted within the left kidney. Bladder is unremarkable. Stomach/Bowel: Stomach is within normal limits. Appendix appears normal. No evidence of bowel wall thickening, distention, or inflammatory changes. Noninflamed diverticula are seen throughout the descending and sigmoid colon. Vascular/Lymphatic: Aortic atherosclerosis. No enlarged abdominal or pelvic lymph nodes. Reproductive: There is moderate to marked severity prostate gland enlargement. Other: No abdominal wall hernia or abnormality. No  abdominopelvic ascites. Musculoskeletal: Multilevel degenerative changes are seen throughout the lumbar spine. IMPRESSION: 1. Bilateral subcentimeter nonobstructing renal calculi. 2. Colonic diverticulosis. 3. Moderate to marked severity prostate gland enlargement. Correlation with PSA levels is recommended. 4. Multilevel degenerative changes throughout the lumbar spine. 5. Aortic atherosclerosis. Electronically Signed   By: Suzen Dials M.D.   On: 10/22/2023 21:17    EKG: Independently reviewed. nsr  Assessment/Plan Principal Problem:   Vertigo Active Problems:   Essential hypertension with goal blood pressure less than 130/80   Obstructive sleep apnea   PAD (peripheral artery disease) (HCC)   Type 2 diabetes mellitus with stage 3a chronic kidney disease, without long-term current use of insulin (HCC)   Obesity (BMI 30-39.9)   CKD stage  3a, GFR 45-59 ml/min (HCC)   # Vertigo MRI nothing acute but does show evidence prior strokes. This may be bppv but patient reports this happens when gets up and moves around but doesn't happen with any sort of movement in bed. Not a great historian so not fully clear whether this is vertigo versus lightheadedness, could be orthostasis (does have new diarrhea) - PT to attempt dix hallpike - OT consult pending - orthostats when patient able to tolerate them  # Lower extremity weakness Unclear if this a specific symptom or more sequelae from above vertigo. No overt weakness on physical exam. MRI nothing acute to explain this, does have history lumbar and cervical spine degenerative disease - will check MRI of lumbar and cervical spine - f/u CK, TSH, b12  # Diarrhea New problem, could contribute to above symptoms. CT abdomen/pelvis nothing acute, no sig lab abnormality - c diff and gi pathogen panel  # History CVA Revealed on today's MRI, nothing acute - will further stroke w/u with TTE, CTA head/neck - lipids tomorrow, continue current statin -  start aspirin - tele overnight  # Enlarged prostate Incidental on CT - check psa  # HTN Here bp mild elevation - will hold home antihypertensives given orthostasis is on our ddx  # Chronic pain - home pregabalin  # MDD - home citalopram  # T2DM Euglycemic - SSI for home trulicity, glipizide, jardiance, metformin  # MCI vs dementia Conversing with patient it's clear there is some degree of cognitive impairment. 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    Devaughn KATHEE Ban MD [facility] Pager [phone]  If 7PM-7AM, please contact night-coverage [email or web address] Password [email or web address]  10/23/2023, 2:18 PM

## 2023-10-23 NOTE — Evaluation (Signed)
 Physical Therapy Evaluation Patient Details Name: Bobby Chen MRN: [id number] DOB: [birth year] Today's Date: 10/23/2023  History of Present Illness  Pt is a 78 y.o. male who presents with weakness, dizziness s/p fall out of his truck. PMH of obesity, HTN, PAD, spinal stenosis, t2dm, ckd 3a.  Clinical Impression  Pt alert, agreeable to PT/OT, seen as a co-treat to maximize safety, dizziness concerns. Pt stated at baseline he is independent, lives alone. Upon assessment the pt initially reported resolvement of dizziness symptoms, but despite multiple promptings with mobility, reported dizziness only after mobilizing, stated he felt like the room was moving. L and R Dix Hallpike negative, orthostatics negative. He was able to ambulate in room with CGA with RW, minA for  bed mobility.  Overall the patient demonstrated deficits (see PT Problem List) that impede the patient's functional abilities, safety, and mobility and would benefit from skilled PT intervention.   Screening questions: denied: unilateral weakness/numbness, facial droop, slurred speech, or difficulty swallowing with episode, tinnitus, diplopia/visual field deficits Reported: aural fullness on L  Subjective history of current problem: Pt reporting sitting in his truck when he suddenly felt very dizzy, 15/10 when asked on 1-10 scale. Lasted 66mins-1hr. No previous history of severe dizziness, but reported prior vertigo, confirmed to be in patient's history. Stated the dizziness did not feel better until EMS arrival.  exacerbating factors: moving, turning head, standing up  Relieving factors: rest  OCULOMOTOR / VESTIBULAR TESTING:  Oculomotor Exam- Room Light  Findings Comments  Ocular Alignment normal   Ocular ROM normal   Spontaneous Nystagmus normal   Gaze-Holding Nystagmus not examined   End-Gaze Nystagmus not examined   Vergence (normal 2-3) not examined   Smooth Pursuit not examined   Saccades not examined       BPPV TESTS:  Symptoms Duration Intensity Nystagmus  Left Dix-Hallpike negative   none  Right Dix-Hallpike negative   none  Left Head Roll negative   none  Right Head Roll negative   none         If plan is discharge home, recommend the following: A little help with walking and/or transfers;Assistance with cooking/housework;Assist for transportation;Help with stairs or ramp for entrance   Can travel by private vehicle        Equipment Recommendations Rolling walker (2 wheels)  Recommendations for Other Services       Functional Status Assessment Patient has had a recent decline in their functional status and demonstrates the ability to make significant improvements in function in a reasonable and predictable amount of time.     Precautions / Restrictions Precautions Precautions: Fall Recall of Precautions/Restrictions: Intact Restrictions Weight Bearing Restrictions Per Provider Order: No      Mobility  Bed Mobility Overal bed mobility: Needs Assistance Bed Mobility: Supine to Sit     Supine to sit: Min assist, +2 for safety/equipment          Transfers Overall transfer level: Needs assistance Equipment used: Rolling walker (2 wheels) Transfers: Sit to/from Stand Sit to Stand: Contact guard assist           General transfer comment: cues for hand placement. pt did later complain of dizziness with standing and walking, denied it during task    Ambulation/Gait   Gait Distance (Feet): 20 Feet Assistive device: Rolling walker (2 wheels), None         General Gait Details: no LOB, attempted without RW but improved steadiness/safety with RW currently  Stairs  Wheelchair Mobility     Tilt Bed    Modified Rankin (Stroke Patients Only)       Balance Overall balance assessment: Needs assistance Sitting-balance support: Feet supported, Single extremity supported Sitting balance-Leahy Scale: Fair     Standing balance  support: During functional activity Standing balance-Leahy Scale: Fair                               Pertinent Vitals/Pain Pain Assessment Pain Assessment: No/denies pain    Home Living Family/patient expects to be discharged to:: Private residence Living Arrangements: Alone;Non-relatives/Friends   Type of Home: House Home Access: Stairs to enter Entrance Stairs-Rails: None Entrance Stairs-Number of Steps: 2   Home Layout: One level Home Equipment: Agricultural consultant (2 wheels);Cane - single point;Rollator (4 wheels);Shower seat      Prior Function Prior Level of Function : Independent/Modified Independent;Driving             Mobility Comments: IND without AD use ADLs Comments: IND, has DME from wife     Extremity/Trunk Assessment   Upper Extremity Assessment Upper Extremity Assessment: Overall WFL for tasks assessed    Lower Extremity Assessment Lower Extremity Assessment: Overall WFL for tasks assessed       Communication   Communication Communication: No apparent difficulties    Cognition Arousal: Alert Behavior During Therapy: WFL for tasks assessed/performed                           PT - Cognition Comments: pt oriented x4, but displayed some STM deficits Following commands: Impaired Following commands impaired: Follows one step commands with increased time, Only follows one step commands consistently     Cueing Cueing Techniques: Verbal cues     General Comments General comments (skin integrity, edema, etc.): orthostatics negative, pt reports dizziness once in standing and after ambulation    Exercises     Assessment/Plan    PT Assessment Patient needs continued PT services  PT Problem List Decreased activity tolerance;Decreased balance;Decreased mobility       PT Treatment Interventions DME instruction;Balance training;Gait training;Neuromuscular re-education;Stair training;Functional mobility training;Patient/family  education;Therapeutic activities;Therapeutic exercise    PT Goals (Current goals can be found in the Care Plan section)  Acute Rehab PT Goals Patient Stated Goal: to return to PLOF PT Goal Formulation: With patient Time For Goal Achievement: 11/06/23 Potential to Achieve Goals: Good    Frequency Min 3X/week     Co-evaluation PT/OT/SLP Co-Evaluation/Treatment: Yes Reason for Co-Treatment: To address functional/ADL transfers;For patient/therapist safety PT goals addressed during session: Mobility/safety with mobility OT goals addressed during session: ADL's and self-care       AM-PAC PT 6 Clicks Mobility  Outcome Measure Help needed turning from your back to your side while in a flat bed without using bedrails?: A Little Help needed moving from lying on your back to sitting on the side of a flat bed without using bedrails?: A Little Help needed moving to and from a bed to a chair (including a wheelchair)?: None Help needed standing up from a chair using your arms (e.g., wheelchair or bedside chair)?: None Help needed to walk in hospital room?: A Little Help needed climbing 3-5 steps with a railing? : A Little 6 Click Score: 20    End of Session   Activity Tolerance: Patient tolerated treatment well Patient left: in chair;with call bell/phone within reach;with chair alarm set Nurse Communication:  Mobility status PT Visit Diagnosis: Other abnormalities of gait and mobility (R26.89);Difficulty in walking, not elsewhere classified (R26.2);Other symptoms and signs involving the nervous system (R29.898)    Time: 1423-1450 PT Time Calculation (min) (ACUTE ONLY): 27 min   Charges:   PT Evaluation $PT Eval Low Complexity: 1 Low PT Treatments $Therapeutic Activity: 8-22 mins PT General Charges $$ ACUTE PT VISIT: 1 Visit         Doyal Shams PT, DPT 4:09 PM,10/23/23

## 2023-10-23 NOTE — Evaluation (Signed)
 Occupational Therapy Evaluation Patient Details Name: Bobby Chen MRN: [id number] DOB: [birth year] Today's Date: 10/23/2023   History of Present Illness   Pt is a 78 y.o. male who presents with weakness, dizziness s/p fall out of his truck. PMH of obesity, HTN, PAD, spinal stenosis, t2dm, ckd 3a.     Clinical Impressions Pt was seen for OT evaluation this date. PTA, pt resides in a one level home with 2 STE alone. He is typically IND at baseline, no AD use. He drives and is community mobile, but has reported a history of dizziness issues. Recent cataract surgeries- April and June 2025.  Pt presents to acute OT demonstrating impaired ADL performance and functional mobility 2/2 dizziness causing mild imbalance. PT/OT co-eval performed d/t pt having severe dizziness limiting his ability to sit at [facility] with nursing staff. Orthostatic vitals taken during session:  BP- Lying Pulse- Lying BP- Sitting Pulse- Sitting BP- Standing at 0 minutes Pulse- Standing at 0 minutes  10/23/23 1439 146/78 95 129/79 102 129/87 107   PT assessing for BPPV, which pt did not appear to have a reaction to. He needed Min A X2 for long sitting in bed and supine to sit at EOB. He required STS from EOB to RW with CGA and ambulated to the door using RW, then back to EOB without AD with CGA. Pt agreeable to sit up in recliner to eat his lunch tray that had just arrived. Anticipate CGA needed for LB ADLs at this time d/t dizziness to ensure safety. Pt needs cueing for safety and staying on task at times during session. Anticipate ability to return home safely once dizziness/balance has improved. Will follow acutely to educate on compensatory strategies and use of AE/AD and DME for ADL performance and functional moonility to maximize his safety despite dizziness.      If plan is discharge home, recommend the following:   A little help with walking and/or transfers;A little help with bathing/dressing/bathroom;Assistance with  cooking/housework     Functional Status Assessment   Patient has had a recent decline in their functional status and demonstrates the ability to make significant improvements in function in a reasonable and predictable amount of time.     Equipment Recommendations   BSC/3in1     Recommendations for Other Services         Precautions/Restrictions   Precautions Precautions: Fall Recall of Precautions/Restrictions: Intact Restrictions Weight Bearing Restrictions Per Provider Order: No     Mobility Bed Mobility Overal bed mobility: Needs Assistance Bed Mobility: Supine to Sit     Supine to sit: Min assist, +2 for safety/equipment     General bed mobility comments: needs Min A X2 to long sit in bed x2 for BPPV testing; Min A x2 to reach EOB with increased time/effort and cues needed    Transfers Overall transfer level: Needs assistance Equipment used: Rolling walker (2 wheels) Transfers: Sit to/from Stand Sit to Stand: Contact guard assist           General transfer comment: CGA for STS from EOB to RW; amb with and without AD with CGA; dizziness noted with standing and ambulation      Balance Overall balance assessment: Needs assistance Sitting-balance support: Feet supported, Single extremity supported Sitting balance-Leahy Scale: Fair     Standing balance support: During functional activity Standing balance-Leahy Scale: Fair Standing balance comment: CGA with and without AD  ADL either performed or assessed with clinical judgement   ADL Overall ADL's : Needs assistance/impaired                                     Functional mobility during ADLs: Contact guard assist;Rolling walker (2 wheels) General ADL Comments: ambulated with and without AD needing CGA d/t dizziness; anticipate Min A for LB ADLs d/t dizziness/unsteadiness at this time     Vision   Additional Comments: pt had cataract  surgery in the past; dizziness/room spinning     Perception         Praxis         Pertinent Vitals/Pain Pain Assessment Pain Assessment: No/denies pain     Extremity/Trunk Assessment Upper Extremity Assessment Upper Extremity Assessment: Overall WFL for tasks assessed   Lower Extremity Assessment Lower Extremity Assessment: Overall WFL for tasks assessed       Communication Communication Communication: No apparent difficulties   Cognition Arousal: Alert Behavior During Therapy: WFL for tasks assessed/performed               OT - Cognition Comments: tangential speech at times? needscueing to stay on task                 Following commands: Impaired Following commands impaired: Only follows one step commands consistently, Follows one step commands with increased time     Cueing  General Comments   Cueing Techniques: Verbal cues  orthostatics negative, pt reports dizziness once in standing and after ambulation   Exercises Other Exercises Other Exercises: Edu on role of OT in acute setting   Shoulder Instructions      Home Living Family/patient expects to be discharged to:: Private residence Living Arrangements: Alone;Non-relatives/Friends   Type of Home: House Home Access: Stairs to enter Secretary/administrator of Steps: 2 Entrance Stairs-Rails: None Home Layout: One level     Bathroom Shower/Tub: Chief Strategy Officer: Standard     Home Equipment: Agricultural consultant (2 wheels);Cane - single point;Rollator (4 wheels);Shower seat          Prior Functioning/Environment Prior Level of Function : Independent/Modified Independent;Driving             Mobility Comments: IND without AD use ADLs Comments: IND, has DME from wife    OT Problem List: Decreased safety awareness;Impaired balance (sitting and/or standing)   OT Treatment/Interventions: Self-care/ADL training;Therapeutic exercise;Therapeutic activities;Patient/family  education;Balance training;DME and/or AE instruction      OT Goals(Current goals can be found in the care plan section)   Acute Rehab OT Goals Patient Stated Goal: improve dizziness OT Goal Formulation: With patient Time For Goal Achievement: 11/06/23 Potential to Achieve Goals: Fair ADL Goals Pt Will Perform Lower Body Bathing: with supervision;sitting/lateral leans;sit to/from stand Pt Will Perform Lower Body Dressing: with supervision;sitting/lateral leans;sit to/from stand;with adaptive equipment Pt Will Transfer to Toilet: with supervision;ambulating;regular height toilet Pt Will Perform Toileting - Clothing Manipulation and hygiene: with supervision;sitting/lateral leans;sit to/from stand   OT Frequency:  Min 2X/week    Co-evaluation              AM-PAC OT 6 Clicks Daily Activity     Outcome Measure Help from another person eating meals?: None Help from another person taking care of personal grooming?: None Help from another person toileting, which includes using toliet, bedpan, or urinal?: A Little Help from another person bathing (including washing,  rinsing, drying)?: A Little Help from another person to put on and taking off regular upper body clothing?: None Help from another person to put on and taking off regular lower body clothing?: A Little 6 Click Score: 21   End of Session Equipment Utilized During Treatment: Rolling walker (2 wheels) Nurse Communication: Mobility status  Activity Tolerance: Patient tolerated treatment well;Other (comment) (limited by dizziness) Patient left: in chair;with call bell/phone within reach;with chair alarm set;with family/visitor present  OT Visit Diagnosis: Other abnormalities of gait and mobility (R26.89);Unsteadiness on feet (R26.81);Dizziness and giddiness (R42)                Time: 8574-8547 OT Time Calculation (min): 27 min Charges:  OT General Charges $OT Visit: 1 Visit OT Evaluation $OT Eval Moderate Complexity:  1 Mod Kadience Macchi, OTR/L  10/23/23, 3:19 PM  Breeonna Mone E Bart Ashford 10/23/2023, 3:04 PM

## 2023-10-24 ENCOUNTER — Observation Stay
Admit: 2023-10-24 | Discharge: 2023-10-24 | Disposition: A | Discharge status: Home / Self Care | Attending: Obstetrics and Gynecology | Admitting: Obstetrics and Gynecology

## 2023-10-24 DIAGNOSIS — R42 Dizziness and giddiness: Secondary | ICD-10-CM | POA: Diagnosis not present

## 2023-10-24 DIAGNOSIS — I6389 Other cerebral infarction: Secondary | ICD-10-CM | POA: Diagnosis not present

## 2023-10-24 LAB — GASTROINTESTINAL PANEL BY PCR, STOOL (REPLACES STOOL CULTURE)

## 2023-10-24 LAB — GLUCOSE, CAPILLARY
Glucose-Capillary: 149 mg/dL — ABNORMAL HIGH (ref 70–99)
Glucose-Capillary: 182 mg/dL — ABNORMAL HIGH (ref 70–99)
Glucose-Capillary: 108 mg/dL — ABNORMAL HIGH (ref 70–99)
Glucose-Capillary: 73 mg/dL (ref 70–99)

## 2023-10-24 LAB — BASIC METABOLIC PANEL WITH GFR
Anion gap: 13 (ref 5–15)
BUN: 21 mg/dL (ref 8–23)
CO2: 24 mmol/L (ref 22–32)
Calcium: 8.4 mg/dL — ABNORMAL LOW (ref 8.9–10.3)
Chloride: 99 mmol/L (ref 98–111)
Creatinine, Ser: 1.34 mg/dL — ABNORMAL HIGH (ref 0.61–1.24)
GFR, Estimated: 55 mL/min — ABNORMAL LOW (ref >60)
Glucose, Bld: 149 mg/dL — ABNORMAL HIGH (ref 70–99)
Potassium: 3.1 mmol/L — ABNORMAL LOW (ref 3.5–5.1)
Sodium: 136 mmol/L (ref 135–145)

## 2023-10-24 LAB — ECHOCARDIOGRAM COMPLETE BUBBLE STUDY
AR max vel: 3.5 cm2
AV Area VTI: 3.75 cm2
AV Area mean vel: 3.33 cm2
AV Mean grad: 3 mmHg
AV Peak grad: 4.6 mmHg
Ao pk vel: 1.07 m/s
Area-P 1/2: 3.95 cm2
MV VTI: 4.63 cm2
S' Lateral: 3 cm

## 2023-10-24 LAB — PSA: Prostatic Specific Antigen: 2.45 ng/mL (ref 0.00–4.00)

## 2023-10-24 LAB — LIPID PANEL
Cholesterol: 93 mg/dL (ref 0–200)
HDL: 33 mg/dL — ABNORMAL LOW (ref >40)
LDL Cholesterol: 33 mg/dL (ref 0–99)
Total CHOL/HDL Ratio: 2.8 ratio
Triglycerides: 133 mg/dL (ref <150)
VLDL: 27 mg/dL (ref 0–40)

## 2023-10-24 LAB — C DIFFICILE QUICK SCREEN W PCR REFLEX
C Diff antigen: NEGATIVE
C Diff interpretation: NOT DETECTED
C Diff toxin: NEGATIVE

## 2023-10-24 MED ORDER — DOXYCYCLINE HYCLATE 100 MG PO TABS
100.0000 mg | ORAL_TABLET | Freq: Two times a day (BID) | ORAL | Status: DC
  Administered 2023-10-24 – 2023-10-30 (×13): 100 mg via ORAL
  Filled 2023-10-24 (×13): qty 1

## 2023-10-24 MED ORDER — POTASSIUM CHLORIDE CRYS ER 20 MEQ PO TBCR
40.0000 meq | EXTENDED_RELEASE_TABLET | Freq: Once | ORAL | Status: AC
  Administered 2023-10-24: 40 meq via ORAL
  Filled 2023-10-24: qty 2

## 2023-10-24 NOTE — Progress Notes (Signed)
*  PRELIMINARY RESULTS* Echocardiogram 2D Echocardiogram has been performed.  Bobby Chen 10/24/2023, 8:44 AM

## 2023-10-24 NOTE — Progress Notes (Signed)
 PROGRESS NOTE    Bobby Chen   FMW:[id number] DOB: 09/07/1945  DOA: 10/22/2023 Date of Service: 10/24/23 which is hospital day 0  PCP: Lenon Layman ORN, MD    Hospital course / significant events:   HPI: Was in his usual state of health, went to [employer], when going back to his truck developed weakness, mainly in lower extremities, and dizziness which he describes as room spinning. The room spinning happens when he gets up. Also watery diarrhea yesterday, new onset.   06/29: to ED.  06/30: Unable to ambulate so hospitalist was contacted for admission. PT/OT to see --> recs HH. CVA w/u given old CVA on MRI brain. MR C spine and L spine --> foraminal/canal stenosis C-spine see below. CTA H/N no LVO, did show stenosis R and L PCA, atherosclerotic disease  07/01: Echo pending read. GI panel (+)salmonella. Sinusitis, started doxycycline .     MRI C-Spine 06/30 IMPRESSION: 1. At C5-C6, severe bilateral foraminal stenosis and moderate canal stenosis. 2. At C6-C7, moderate right foraminal stenosis and mild canal stenosis. 3. At C3-C4, moderate left foraminal stenosis.  MRI L-Spine 06/30 IMPRESSION: 1. At L4-L5, moderate bilateral subarticular recess stenosis, mild canal stenosis, and mild left foraminal stenosis. 2. At L5-S1, mild right subarticular recess and foraminal stenosis. 3. At L2-L3, mild right subarticular recess stenosis.    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  CT abdomen/pelvis  nothing acute No sig lab abnormality Po hydration Supportive care    Hypokalemia Replace as needed Monitor BMP  CKD3a Monitor BMP  History CVA Revealed on MRI, nothing acute further stroke w/u with TTE, CTA head/neck ASA, statin Await echo results    HLD Cerebral Atherosclerotic disease Statin  Enlarged prostate Incidental on CT check psa and follow outpatient    HTN Here bp mild elevation hold home antihypertensives given orthostasis is on our ddx   Chronic pain home pregabalin    MDD home citalopram   T2DM Euglycemic SSI for home trulicity, glipizide, jardiance, metformin   MCI vs dementia Conversing with patient it's clear there is some degree of cognitive impairment. MRI would support vascular dementia Outpatient f/u      Class 1 obesity based on BMI: Body mass index is 33.08 kg/m.SABRA Significantly low or high BMI is associated with higher medical risk.  Underweight - under 18  overweight - 25 to 29 obese - 30 or more Class 1 obesity: BMI of 30.0 to 34 Class 2 obesity: BMI of 35.0 to 39 Class 3 obesity: BMI of 40.0 to 49 Super Morbid Obesity: BMI 50-59 Super-super Morbid Obesity: BMI 60+ Healthy nutrition and physical activity advised as adjunct to other disease management and risk reduction treatments    DVT prophylaxis: lovenox IV fluids: no continuous IV fluids  Nutrition: carb modified diet Central lines / other devices: none  Code Status: FULL CODE ACP documentation reviewed:  none on file in [facility]  TOC needs: HH/DME Medical barriers to dispo: Echo read. DME/HH to arrange, pt has no help at home until tomorrow,  lives alone. Expected medical readiness for discharge tomorrow.              Subjective / Brief ROS:  Patient reports dizzy - spinning sensation and lightheaded w/standing, a bit better from yesterday Reports sinus congestions  Denies CP/SOB.  Pain controlled.  Denies new weakness.  Tolerating diet.  Reports no  concerns w/ urination/defecation.   Family Communication: none     Objective Findings:  Vitals:   10/23/23 1330 10/23/23 2035 10/24/23 0315 10/24/23 0806  BP: 136/71 (!) 149/71 124/64 125/71  Pulse: 99 (!) 109 (!) 103 95  Resp: 17 17 17 17   Temp: 98.6 F (37 C) 99.6 F (37.6 C) 99.2 F (37.3 C) 100.1 F (37.8 C)  TempSrc: Oral   Oral  SpO2: 94% 99% 92% 92%  Weight:      Height:       No intake or output data in the 24 hours ending 10/24/23 1441 Filed Weights   10/22/23 1903 10/23/23 1328  Weight: 99.8 kg 95.8 kg    Examination:  Physical Exam Constitutional:      General: He is not in acute distress.    Appearance: He is not ill-appearing.   Cardiovascular:     Rate and Rhythm: Normal rate and regular rhythm.  Pulmonary:     Effort: Pulmonary effort is normal.     Breath sounds: Normal breath sounds.   Musculoskeletal:     Right lower leg: No edema.     Left lower leg: No edema.   Skin:    General: Skin is warm and dry.   Neurological:     General: No focal deficit present.     Mental Status: He is alert.   Psychiatric:        Mood and Affect: Mood normal.        Behavior: Behavior normal.          Scheduled Medications:   aspirin EC  81 mg Oral Daily   atorvastatin  40 mg Oral QHS   brimonidine  1 drop Both Eyes BID   And   timolol  1 drop Both Eyes BID   citalopram  10 mg Oral Daily   doxycycline   100 mg Oral BID   enoxaparin (LOVENOX) injection  40 mg Subcutaneous Q24H   insulin aspart  0-15 Units Subcutaneous TID WC   insulin aspart  0-5 Units Subcutaneous QHS   pregabalin  50 mg Oral BID    Continuous Infusions:   PRN Medications:  mouth rinse, pantoprazole  Antimicrobials from admission:  Anti-infectives (From admission, onward)    Start     Dose/Rate Route Frequency Ordered Stop   10/24/23 1530  doxycycline  (VIBRA -TABS) tablet 100 mg        100 mg Oral 2 times daily 10/24/23 1438             Data Reviewed:  I  have personally reviewed the following...  CBC: Recent Labs  Lab 10/22/23 1944  WBC 11.8*  NEUTROABS 9.8*  HGB 13.6  HCT 41.2  MCV 92.8  PLT 196   Basic Metabolic Panel: Recent Labs  Lab 10/22/23 1944 10/24/23 0418  NA 133* 136  K 3.5 3.1*  CL 99 99  CO2 24 24  GLUCOSE 138* 149*  BUN 15 21  CREATININE 1.33* 1.34*  CALCIUM 8.7* 8.4*   GFR: Estimated Creatinine Clearance: 51.7 mL/min (A) (by C-G formula based on SCr of 1.34 mg/dL (H)). Liver Function Tests: Recent Labs  Lab  10/22/23 1944  AST 22  ALT 16  ALKPHOS 64  BILITOT 0.9  PROT 6.6  ALBUMIN 3.7   Recent Labs  Lab 10/22/23 1944  LIPASE 30   No results for input(s): AMMONIA in the last 168 hours. Coagulation Profile: No results for input(s): INR, PROTIME in the last 168 hours. Cardiac Enzymes: Recent Labs  Lab 10/23/23 1530  CKTOTAL 221   BNP (last 3 results) No results for input(s): PROBNP in the last 8760 hours. HbA1C: Recent Labs    10/23/23 1530  HGBA1C 6.6*   CBG: Recent Labs  Lab 10/23/23 1808 10/23/23 2125 10/24/23 0803 10/24/23 1142  GLUCAP 142* 160* 149* 182*   Lipid Profile: Recent Labs    10/24/23 0418  CHOL 93  HDL 33*  LDLCALC 33  TRIG 866  CHOLHDL 2.8   Thyroid Function Tests: Recent Labs    10/23/23 1530  TSH 0.744   Anemia Panel: Recent Labs    10/23/23 1530  VITAMINB12 392   Most Recent Urinalysis On File:     Component Value Date/Time   COLORURINE YELLOW (A) 10/22/2023 2213   APPEARANCEUR CLEAR (A) 10/22/2023 2213   LABSPEC >1.046 (H) 10/22/2023 2213   PHURINE 5.0 10/22/2023 2213   GLUCOSEU >=500 (A) 10/22/2023 2213   HGBUR NEGATIVE 10/22/2023 2213   BILIRUBINUR NEGATIVE 10/22/2023 2213   KETONESUR 5 (A) 10/22/2023 2213   PROTEINUR NEGATIVE 10/22/2023 2213   NITRITE NEGATIVE 10/22/2023 2213   LEUKOCYTESUR NEGATIVE 10/22/2023 2213   Sepsis Labs: @LABRCNTIP (procalcitonin:4,lacticidven:4) Microbiology: Recent Results (from the past  240 hours)  Gastrointestinal Panel by PCR , Stool     Status: Abnormal   Collection Time: 10/24/23  9:22 AM   Specimen: STOOL  Result Value Ref Range Status   Campylobacter species NOT DETECTED NOT DETECTED Final   Plesimonas shigelloides NOT DETECTED NOT DETECTED Final   Salmonella species DETECTED (A) NOT DETECTED Final    Comment: RESULT CALLED TO, READ BACK BY AND VERIFIED WITH: MERILEE MODENA 10/24/23 1307 MW    Yersinia enterocolitica NOT DETECTED NOT DETECTED Final   Vibrio species NOT DETECTED NOT DETECTED Final   Vibrio cholerae NOT DETECTED NOT DETECTED Final   Enteroaggregative E coli (EAEC) NOT DETECTED NOT DETECTED Final   Enteropathogenic E coli (EPEC) NOT DETECTED NOT DETECTED Final   Enterotoxigenic E coli (ETEC) NOT DETECTED NOT DETECTED Final   Shiga like toxin producing E coli (STEC) NOT DETECTED NOT DETECTED Final   Shigella/Enteroinvasive E coli (EIEC) NOT DETECTED NOT DETECTED Final   Cryptosporidium NOT DETECTED NOT DETECTED Final   Cyclospora cayetanensis NOT DETECTED NOT DETECTED Final   Entamoeba histolytica NOT DETECTED NOT DETECTED Final   Giardia lamblia NOT DETECTED NOT DETECTED Final   Adenovirus F40/41 NOT DETECTED NOT DETECTED Final   Astrovirus NOT DETECTED NOT DETECTED Final   Norovirus GI/GII NOT DETECTED NOT DETECTED Final   Rotavirus A NOT DETECTED NOT DETECTED Final   Sapovirus (I, II, IV, and V) NOT DETECTED NOT DETECTED Final    Comment: Performed at [facility], [address] [place], [place] [address]  C Difficile Quick Screen w PCR reflex     Status: None   Collection Time: 10/24/23  9:22 AM   Specimen: STOOL  Result Value Ref Range Status   C Diff antigen NEGATIVE NEGATIVE Final   C Diff toxin NEGATIVE NEGATIVE Final   C Diff interpretation No C. difficile detected.  Final    Comment: Performed at [facility], [address] [place], [place] [address]      Radiology Studies last 3 days: MR LUMBAR SPINE WO  CONTRAST Result Date: 10/24/2023 CLINICAL DATA:  lower extremity weakness EXAM: MRI LUMBAR SPINE WITHOUT CONTRAST TECHNIQUE: Multiplanar, multisequence MR imaging of the lumbar spine was performed. No intravenous contrast was administered. COMPARISON:  None Available. FINDINGS: Segmentation:  Standard. Alignment:  Physiologic. Vertebrae:  No fracture, evidence of discitis, or bone lesion. Conus medullaris and cauda equina: Conus extends to the T12-L1 level. Conus and cauda equina appear normal. Paraspinal and other soft tissues: Negative. Disc levels: T12-L1: No significant disc protrusion, foraminal stenosis, or canal stenosis. L1-L2: No significant disc protrusion, foraminal stenosis, or canal stenosis. L2-L3: Right greater than left facet arthropathy and ligamentum flavum thickening with mild right subarticular recess stenosis. Patent canal and foramina. L3-L4: Ligamentum flavum thickening and facet arthropathy without significant stenosis. L4-L5: Disc bulging with bilateral facet arthropathy. Resulting mild left foraminal stenosis and moderate bilateral subarticular recess stenosis. Mild canal stenosis. L5-S1: Right eccentric disc bulge with annular fissure. Resulting mild right subarticular recess stenosis. Mild right foraminal stenosis. Patent canal and left foramen. IMPRESSION: 1. At L4-L5, moderate bilateral subarticular recess stenosis, mild canal stenosis, and mild left foraminal stenosis. 2. At L5-S1, mild right subarticular recess and foraminal stenosis. 3. At L2-L3, mild right subarticular recess stenosis. Electronically Signed   By: Gilmore GORMAN Molt M.D.   On: 10/24/2023 00:56   MR CERVICAL SPINE WO CONTRAST Result Date: 10/23/2023 CLINICAL DATA:  lower extremity weakness EXAM: MRI CERVICAL SPINE WITHOUT CONTRAST TECHNIQUE: Multiplanar, multisequence MR imaging of the cervical spine was performed. No intravenous contrast was administered. COMPARISON:  None Available. FINDINGS: Alignment: No  substantial sagittal subluxation. Vertebrae: No fracture, evidence of discitis, or suspicious bone lesion. Cord: Normal cord signal. Posterior Fossa, vertebral arteries, paraspinal tissues: Visualized vertebral artery flow voids are maintained. No acute abnormality the visualized posterior fossa. No paraspinal edema. Disc levels: C2-C3: No significant disc protrusion, foraminal stenosis, or canal stenosis. C3-C4: Left greater than right facet and uncovertebral hypertrophy with resulting moderate left foraminal stenosis. Patent canal and mild right foraminal stenosis. C4-C5: Bilateral facet arthropathy without significant canal or foraminal stenosis. C5-C6: Posterior disc osteophyte complex with bilateral facet and uncovertebral hypertrophy. Resulting severe bilateral foraminal stenosis and moderate canal stenosis. C6-C7: Right eccentric posterior disc osteophyte complex with right-sided uncovertebral hypertrophy. Resulting moderate right foraminal stenosis. Mild canal stenosis. C7-T1: Bilateral uncovertebral hypertrophy without significant canal or foraminal stenosis. IMPRESSION: 1. At C5-C6, severe bilateral foraminal stenosis and moderate canal stenosis. 2. At C6-C7, moderate right foraminal stenosis and mild canal stenosis. 3. At C3-C4, moderate left foraminal stenosis. Electronically Signed   By: Gilmore GORMAN Molt M.D.   On: 10/23/2023 23:48   CT ANGIO HEAD NECK W WO CM Result Date: 10/23/2023 CLINICAL DATA:  History of prior stroke, developed weakness mainly in lower extremities and dizziness. EXAM: CT ANGIOGRAPHY HEAD AND NECK WITH AND WITHOUT CONTRAST TECHNIQUE: Multidetector CT imaging of the head and neck was performed using the standard protocol during bolus administration of intravenous contrast. Multiplanar CT image reconstructions and MIPs were obtained to evaluate the vascular anatomy. Carotid stenosis measurements (when applicable) are obtained utilizing NASCET criteria, using the distal internal  carotid diameter as the denominator. RADIATION DOSE REDUCTION: This exam was performed according to the departmental dose-optimization program which includes automated exposure control, adjustment of the mA and/or kV according to patient size and/or use of iterative reconstruction technique. CONTRAST:  75mL OMNIPAQUE  IOHEXOL  350 MG/ML SOLN COMPARISON:  MRI head same day.  MRA head 05/28/2018. FINDINGS: CT HEAD FINDINGS Brain: No acute intracranial hemorrhage. Remote right PCA territory infarct in the posteroinferior right occipital lobe. Nonspecific hypoattenuation in the periventricular and subcortical white matter favored to reflect chronic microvascular ischemic changes. Mild parenchymal volume loss. No edema, mass effect, or midline shift. The basilar cisterns are patent. No extra-axial fluid collection. Ventricles: Ventricles are normal in size and configuration. Vascular: No hyperdense vessel. Intracranial atherosclerotic calcifications noted. Skull: No acute or aggressive finding. Sinuses/orbits: Bilateral lens replacement. Mucosal thickening in the alveolar recess of the left maxillary sinus. Layering secretions in the left sphenoid sinus. Other: Mastoid air cells are clear. CTA NECK FINDINGS Aortic arch: Four vessel configuration of the aortic arch. Imaged portion shows no evidence of aneurysm or dissection. Mild atherosclerosis of the visualized aortic arch. Atherosclerosis involves the origin of the left vertebral artery resulting in mild stenosis. Pulmonary arteries: As permitted by contrast timing, there are no filling defects in the visualized pulmonary arteries. Subclavian arteries: Patent bilaterally. Mild atherosclerosis involving the proximal aspect of both subclavian arteries without significant stenosis. Right carotid system: No evidence of dissection, stenosis (50% or greater), or occlusion. Left carotid system: Patent. Mild atherosclerosis at the carotid bifurcation without hemodynamically  significant stenosis. No evidence of dissection. Vertebral arteries: Codominant. The vertebral arteries are patent from the origins to the vertebrobasilar confluence. Atherosclerosis involving both vertebral artery origins with moderate stenosis on the right and mild stenosis on the left. Atherosclerosis of the bilateral V4 segments resulting in mild stenosis slightly greater on the left. No occlusion or dissection. Skeleton: No acute findings. Degenerative changes in the cervical spine. Partially calcified disc bulge at C5-6 resulting in at least mild spinal canal stenosis. Other neck: The visualized airway is patent. No cervical lymphadenopathy. Upper chest: Visualized lung apices are clear. Review of the MIP images confirms the above findings CTA HEAD FINDINGS ANTERIOR CIRCULATION: The intracranial internal carotid arteries are patent bilaterally. Atherosclerosis of the bilateral carotid siphons. There is mild stenosis of the bilateral cavernous ICAs. Mild-to-moderate stenosis of the right supraclinoid ICA. MCAs: The middle cerebral arteries are patent bilaterally. ACAs: The anterior cerebral arteries are patent bilaterally. POSTERIOR CIRCULATION: No significant stenosis, proximal occlusion, aneurysm, or vascular malformation. PCAs: The right P1 segment is patent proximally. There is severe narrowing of the right PCA near the P1/P2 junction. Additional severe stenosis of the P2 segment of the right PCA with subtotal occlusion of the vessel. The right PCA distal branches appear patent. The left PCA is patent with moderate stenosis of the P2 segment noted. Pcomm: Visualized on the right. SCAs: The superior cerebellar arteries are patent bilaterally. Basilar artery: Patent AICAs: Not well visualized. PICAs: Patent Vertebral arteries: As above. Venous sinuses: As permitted by contrast timing, patent. Anatomic variants: None Review of the MIP images confirms the above findings IMPRESSION: No large vessel occlusion.  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 Additional severe stenosis and subtotal occlusion of the P2 segment right PCA. Moderate stenosis of the P2 segment left PCA. Atherosclerosis of the carotid siphons resulting in mild-to-moderate stenosis of the right supraclinoid ICA. Atherosclerosis at the vertebral artery origins resulting in moderate right and mild left ostial stenosis. Additional mild stenosis of the bilateral V4 segments. No CT evidence of acute intracranial abnormality. Remote right PCA territory infarct noted. Layering secretions in the left sphenoid sinus. Recommend clinical correlation for acute sinusitis. Electronically Signed   By: Donnice Mania M.D.   On: 10/23/2023  16:47   MR BRAIN WO CONTRAST Result Date: 10/23/2023 CLINICAL DATA:  78 year old male status post fall. Neurologic deficit. Weakness, lightheaded. EXAM: MRI HEAD WITHOUT CONTRAST TECHNIQUE: Multiplanar, multiecho pulse sequences of the brain and surrounding structures were obtained without intravenous contrast. COMPARISON:  Brain MRI 04/15/2021. FINDINGS: Brain: No restricted diffusion to suggest acute infarction. No midline shift, mass effect, evidence of mass lesion, ventriculomegaly, extra-axial collection or acute intracranial hemorrhage. Cervicomedullary junction and pituitary are within normal limits. Stable cerebral volume since 2022. Moderate patchy and scattered bilateral cerebral white matter T2 and FLAIR hyperintensity for age, generally stable but involvement of the posterior deep white matter capsules on the right series 11, image 27 is new from the previous MRI. Furthermore, new but chronic appearing encephalomalacia in the right occipital pole indicative of interval right PCA territory infarcts series 11, image 21. No other cortical encephalomalacia identified. No significant chronic cerebral blood products on SWI. Furthermore there are several small but chronic appearing lacunar infarcts in the left  thalamus which appear new (series 10, image 14). Brainstem and cerebellum remain negative. Vascular: Major intracranial vascular flow voids are stable. Chronic intracranial artery tortuosity. Skull and upper cervical spine: Negative for age visible cervical spine. Visualized bone marrow signal is within normal limits. Sinuses/Orbits: Postoperative changes to both globes since 2022. Paranasal Visualized paranasal sinuses and mastoids are stable and well aerated. Other: Visible internal auditory structures appear normal. Negative visible scalp and face. IMPRESSION: 1. No acute intracranial abnormality. But multifocal progressed chronic ischemic disease in the brain since 2022: Interval right PCA infarct affecting the occipital pole, small vessel lacunar infarcts in the posterior deep white matter capsules on the right, and medial thalamus on the left. 2. Underlying moderate for age chronic white matter changes also most commonly due to small vessel disease. Electronically Signed   By: VEAR Hurst M.D.   On: 10/23/2023 10:06   CT ABDOMEN PELVIS W CONTRAST Result Date: 10/22/2023 CLINICAL DATA:  Status post fall. EXAM: CT ABDOMEN AND PELVIS WITH CONTRAST TECHNIQUE: Multidetector CT imaging of the abdomen and pelvis was performed using the standard protocol following bolus administration of intravenous contrast. RADIATION DOSE REDUCTION: This exam was performed according to the departmental dose-optimization program which includes automated exposure control, adjustment of the mA and/or kV according to patient size and/or use of iterative reconstruction technique. CONTRAST:  OMNIPAQUE  IOHEXOL  350 MG/ML SOLN COMPARISON:  March 31, 2011 FINDINGS: Lower chest: Mild to moderate severity atelectatic changes are seen within the right lung base. Hepatobiliary: No focal liver abnormality is seen. No gallstones, gallbladder wall thickening, or biliary dilatation. Pancreas: Unremarkable. No pancreatic ductal dilatation or  surrounding inflammatory changes. Spleen: Normal in size without focal abnormality. Adrenals/Urinary Tract: Adrenal glands are unremarkable. Kidneys are normal, without obstructing renal calculi, focal lesion, or hydronephrosis. A 1 mm nonobstructing renal calculus is seen within the mid right kidney. Adjacent 2 mm and 3 mm nonobstructing renal calculi are noted within the left kidney. Bladder is unremarkable. Stomach/Bowel: Stomach is within normal limits. Appendix appears normal. No evidence of bowel wall thickening, distention, or inflammatory changes. Noninflamed diverticula are seen throughout the descending and sigmoid colon. Vascular/Lymphatic: Aortic atherosclerosis. No enlarged abdominal or pelvic lymph nodes. Reproductive: There is moderate to marked severity prostate gland enlargement. Other: No abdominal wall hernia or abnormality. No abdominopelvic ascites. Musculoskeletal: Multilevel degenerative changes are seen throughout the lumbar spine. IMPRESSION: 1. Bilateral subcentimeter nonobstructing renal calculi. 2. Colonic diverticulosis. 3. Moderate to marked severity prostate gland enlargement. Correlation  with PSA levels is recommended. 4. Multilevel degenerative changes throughout the lumbar spine. 5. Aortic atherosclerosis. Electronically Signed   By: Suzen Dials M.D.   On: 10/22/2023 21:17           Dariann Huckaba, DO [facility] 10/24/2023, 2:41 PM    Dictation software may have been used to generate the above note. Typos may occur and escape review in typed/dictated notes. Please contact Dr Marsa directly for clarity if needed.  Staff may message me via secure chat in Epic  but this may not receive an immediate response,  please page me for urgent matters!  If 7PM-7AM, please contact night coverage [email or web address]

## 2023-10-24 NOTE — Hospital Course (Addendum)
 Hospital course / significant events:   HPI: Was in his usual state of health, went to [employer], when going back to his truck developed weakness, mainly in lower extremities, and dizziness which he describes as room spinning. The room spinning happens when he gets up. Also watery diarrhea yesterday, new onset.   06/29: to ED.  06/30: Unable to ambulate so hospitalist was contacted for admission. PT/OT to see --> recs HH. CVA w/u given old CVA on MRI brain. MR C spine and L spine --> foraminal/canal stenosis C-spine see below. CTA H/N no LVO, did show stenosis R and L PCA, atherosclerotic disease  07/01: Echo pending read. GI panel (+)salmonella. Sinusitis, started doxycycline .     MRI C-Spine 06/30 IMPRESSION: 1. At C5-C6, severe bilateral foraminal stenosis and moderate canal stenosis. 2. At C6-C7, moderate right foraminal stenosis and mild canal stenosis. 3. At C3-C4, moderate left foraminal stenosis.  MRI L-Spine 06/30 IMPRESSION: 1. At L4-L5, moderate bilateral subarticular recess stenosis, mild canal stenosis, and mild left foraminal stenosis. 2. At L5-S1, mild right subarticular recess and foraminal stenosis. 3. At L2-L3, mild right subarticular recess stenosis.    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  CT abdomen/pelvis nothing acute No sig lab abnormality Po hydration Supportive care    Hypokalemia Replace as needed Monitor BMP  CKD3a Monitor BMP  History CVA Revealed on MRI, nothing  acute further stroke w/u with TTE, CTA head/neck ASA, statin Await echo results    HLD Cerebral Atherosclerotic disease Statin  Enlarged prostate Incidental on CT check psa and follow outpatient    HTN Here bp mild elevation hold home antihypertensives given orthostasis is on our ddx   Chronic pain home pregabalin    MDD home citalopram   T2DM Euglycemic SSI for home trulicity, glipizide, jardiance, metformin   MCI vs dementia Conversing with patient it's clear there is some degree of cognitive impairment. MRI would support vascular dementia Outpatient f/u      Class 1 obesity based on BMI: Body mass index is 33.08 kg/m.SABRA Significantly low or high BMI is associated with higher medical risk.  Underweight - under 18  overweight - 25 to 29 obese - 30 or more Class 1 obesity: BMI of 30.0 to 34 Class 2 obesity: BMI of 35.0 to 39 Class 3 obesity: BMI of 40.0 to 49 Super Morbid Obesity: BMI 50-59 Super-super Morbid Obesity: BMI 60+ Healthy nutrition and physical activity advised as adjunct to other disease management and risk reduction treatments    DVT prophylaxis: lovenox IV fluids: no continuous IV fluids  Nutrition: carb modified diet Central lines / other devices: none  Code Status: FULL CODE ACP documentation reviewed:  none on file in [facility]  TOC needs: HH/DME Medical barriers to dispo: Echo read. DME/HH to arrange, pt has no help at home until tomorrow, lives alone. Expected medical readiness for discharge tomorrow.

## 2023-10-24 NOTE — Progress Notes (Signed)
 Occupational Therapy Treatment Patient Details Name: Bobby Chen MRN: [id number] DOB: 05/04/1945 Today's Date: 10/24/2023   History of present illness Pt is a 78 y.o. male who presents with weakness, dizziness s/p fall out of his truck. PMH of obesity, HTN, PAD, spinal stenosis, t2dm, ckd 3a.   OT comments  Pt is supine in bed on arrival. Pleasant and agreeable to OT session. He denies pain. Pt performed bed mobility with CGA this date, denies dizziness this date. Reports he is still off from his baseline in regards to his balance. SPT to [facility] with CGA, but noted to urinate in the floor. UB bathing performed seated EOB with supervision. LB bathing standing at sink with constant close CGA to prevent LOB as pt tends to have posterior bias/LOB with increased cueing for unilateral support on sink or RW at all times during dynamic standing tasks. Linens changed and he is still a high fall risk. Pt returned to bed with all needs in place and will cont to require skilled acute OT services to maximize his safety and IND to return to PLOF.       If plan is discharge home, recommend the following:  A little help with walking and/or transfers;A little help with bathing/dressing/bathroom;Assistance with cooking/housework;Direct supervision/assist for medications management;Supervision due to cognitive status;Direct supervision/assist for financial management;Assist for transportation;Help with stairs or ramp for entrance   Equipment Recommendations  BSC/3in1    Recommendations for Other Services      Precautions / Restrictions Precautions Precautions: Fall Recall of Precautions/Restrictions: Intact Restrictions Weight Bearing Restrictions Per Provider Order: No       Mobility Bed Mobility Overal bed mobility: Needs Assistance Bed Mobility: Supine to Sit, Sit to Supine     Supine to sit: Contact guard     General bed mobility comments: no physical support    Transfers Overall transfer  level: Needs assistance Equipment used: Rolling walker (2 wheels), None Transfers: Sit to/from Stand Sit to Stand: Contact guard assist           General transfer comment: CGA for STS from EOB and SPT to [facility], urinated in the floor, able to stand at sink with CGA and unilateral support on sink to prevent posterior LOB     Balance Overall balance assessment: Needs assistance Sitting-balance support: Feet supported, Single extremity supported Sitting balance-Leahy Scale: Fair     Standing balance support: During functional activity, Single extremity supported Standing balance-Leahy Scale: Fair Standing balance comment: CGA, unilateral support on sink for dynamic tasks                           ADL either performed or assessed with clinical judgement   ADL Overall ADL's : Needs assistance/impaired     Grooming: Wash/dry face;Sitting;Set up   Upper Body Bathing: Supervision/ safety;Set up;Sitting Upper Body Bathing Details (indicate cue type and reason): EOB Lower Body Bathing: Sitting/lateral leans;Contact guard assist;Sit to/from stand Lower Body Bathing Details (indicate cue type and reason): need cues to use unilateral support on sink counter to maintain balance, posterior LOB at times although able to self correct with cues Upper Body Dressing : Sitting;Minimal assistance Upper Body Dressing Details (indicate cue type and reason): on EOB to manage tele     Toilet Transfer: Contact guard assist;BSC/3in1;Stand-pivot                  Extremity/Trunk Assessment  Vision       Perception     Praxis     Communication Communication Communication: No apparent difficulties   Cognition Arousal: Alert Behavior During Therapy: WFL for tasks assessed/performed                                 Following commands: Impaired Following commands impaired: Follows one step commands with increased time      Cueing   Cueing  Techniques: Verbal cues  Exercises Other Exercises Other Exercises: Edu on safety measures to utilize during ADL performance to prevent falls.    Shoulder Instructions       General Comments      Pertinent Vitals/ Pain       Pain Assessment Pain Assessment: No/denies pain  Home Living                                          Prior Functioning/Environment              Frequency  Min 2X/week        Progress Toward Goals  OT Goals(current goals can now be found in the care plan section)  Progress towards OT goals: Progressing toward goals  Acute Rehab OT Goals Patient Stated Goal: improve balance OT Goal Formulation: With patient Time For Goal Achievement: 11/06/23 Potential to Achieve Goals: Fair  Plan      Co-evaluation                 AM-PAC OT 6 Clicks Daily Activity     Outcome Measure   Help from another person eating meals?: None Help from another person taking care of personal grooming?: None Help from another person toileting, which includes using toliet, bedpan, or urinal?: A Little Help from another person bathing (including washing, rinsing, drying)?: A Little Help from another person to put on and taking off regular upper body clothing?: A Little Help from another person to put on and taking off regular lower body clothing?: A Little 6 Click Score: 20    End of Session    OT Visit Diagnosis: Other abnormalities of gait and mobility (R26.89);Unsteadiness on feet (R26.81)   Activity Tolerance Patient tolerated treatment well   Patient Left in bed;with bed alarm set;with call bell/phone within reach   Nurse Communication Mobility status        Time: 1430-1502 OT Time Calculation (min): 32 min  Charges: OT General Charges $OT Visit: 1 Visit OT Treatments $Self Care/Home Management : 23-37 mins  Nilo Fallin, OTR/L  10/24/23, 4:18 PM   Kevyn Boquet E Donya Hitch 10/24/2023, 4:11 PM

## 2023-10-24 NOTE — Care Management Obs Status (Signed)
 MEDICARE OBSERVATION STATUS NOTIFICATION   Patient Details  Name: Bobby Chen MRN: [id number] Date of Birth: 1945-11-24   Medicare Observation Status Notification Given:  Yes    Merri Dimaano W, CMA 10/24/2023, 11:24 AM

## 2023-10-24 NOTE — Progress Notes (Addendum)
 Physical Therapy Treatment Patient Details Name: Bobby Chen MRN: [id number] DOB: 1945/08/23 Today's Date: 10/24/2023   History of Present Illness Pt is a 78 y.o. male who presents with weakness, dizziness s/p fall out of his truck. PMH of obesity, HTN, PAD, spinal stenosis, t2dm, ckd 3a.    PT Comments  Patient alert, agreeable to PT. Noted for soiled gown and bed, able to transfer to EOB with CGA, and then stand without AD, but unsteady, reaches for external support. Provided with RW improved steadiness with it, but still endorsed not feeling at his baseline. Able to ambulate ~45ft with RW, CGA. Returned to room and up in recliner with needs in reach. The patient would benefit from further skilled PT intervention to continue to progress towards goals.    If plan is discharge home, recommend the following: A little help with walking and/or transfers;Assistance with cooking/housework;Assist for transportation;Help with stairs or ramp for entrance   Can travel by private vehicle        Equipment Recommendations  Rolling walker (2 wheels)    Recommendations for Other Services       Precautions / Restrictions Precautions Precautions: Fall Recall of Precautions/Restrictions: Intact Restrictions Weight Bearing Restrictions Per Provider Order: No     Mobility  Bed Mobility Overal bed mobility: Needs Assistance Bed Mobility: Supine to Sit     Supine to sit: Contact guard          Transfers Overall transfer level: Needs assistance Equipment used: Rolling walker (2 wheels), None Transfers: Sit to/from Stand Sit to Stand: Contact guard assist           General transfer comment: a bit unsteady without RW, improved with RW but needed cues for hand placement for safe technique    Ambulation/Gait Ambulation/Gait assistance: Contact guard assist Gait Distance (Feet): 55 Feet Assistive device: Rolling walker (2 wheels)         General Gait Details: some unsteadiness  but no physical assistance for ambulation, but not at baseline   Stairs             Wheelchair Mobility     Tilt Bed    Modified Rankin (Stroke Patients Only)       Balance Overall balance assessment: Needs assistance Sitting-balance support: Feet supported, Single extremity supported Sitting balance-Leahy Scale: Fair       Standing balance-Leahy Scale: Fair                              Hotel manager: No apparent difficulties  Cognition Arousal: Alert Behavior During Therapy: WFL for tasks assessed/performed                           PT - Cognition Comments: pt oriented x4, but displayed some STM deficits Following commands: Impaired Following commands impaired: Follows one step commands with increased time    Cueing Cueing Techniques: Verbal cues  Exercises      General Comments        Pertinent Vitals/Pain Pain Assessment Pain Assessment: No/denies pain    Home Living                          Prior Function            PT Goals (current goals can now be found in the care plan section) Progress towards PT goals: Progressing toward  goals    Frequency    Min 3X/week      PT Plan      Co-evaluation              AM-PAC PT 6 Clicks Mobility   Outcome Measure  Help needed turning from your back to your side while in a flat bed without using bedrails?: A Little Help needed moving from lying on your back to sitting on the side of a flat bed without using bedrails?: A Little Help needed moving to and from a bed to a chair (including a wheelchair)?: A Little Help needed standing up from a chair using your arms (e.g., wheelchair or bedside chair)?: A Little Help needed to walk in hospital room?: A Little Help needed climbing 3-5 steps with a railing? : A Little 6 Click Score: 18    End of Session   Activity Tolerance: Patient tolerated treatment well Patient left: in  chair;with call bell/phone within reach;with chair alarm set Nurse Communication: Mobility status PT Visit Diagnosis: Other abnormalities of gait and mobility (R26.89);Difficulty in walking, not elsewhere classified (R26.2);Other symptoms and signs involving the nervous system (R29.898)     Time: 9080-9072 PT Time Calculation (min) (ACUTE ONLY): 8 min  Charges:    $Therapeutic Activity: 8-22 mins PT General Charges $$ ACUTE PT VISIT: 1 Visit                     Doyal Shams PT, DPT 11:56 AM,10/24/23

## 2023-10-24 NOTE — Plan of Care (Signed)
   Problem: Education: Goal: Knowledge of General Education information will improve Description Including pain rating scale, medication(s)/side effects and non-pharmacologic comfort measures Outcome: Progressing

## 2023-10-25 DIAGNOSIS — R42 Dizziness and giddiness: Secondary | ICD-10-CM | POA: Diagnosis not present

## 2023-10-25 LAB — GLUCOSE, CAPILLARY
Glucose-Capillary: 91 mg/dL (ref 70–99)
Glucose-Capillary: 133 mg/dL — ABNORMAL HIGH (ref 70–99)
Glucose-Capillary: 170 mg/dL — ABNORMAL HIGH (ref 70–99)
Glucose-Capillary: 131 mg/dL — ABNORMAL HIGH (ref 70–99)

## 2023-10-25 LAB — BASIC METABOLIC PANEL WITH GFR
Anion gap: 10 (ref 5–15)
BUN: 23 mg/dL (ref 8–23)
CO2: 25 mmol/L (ref 22–32)
Calcium: 8.5 mg/dL — ABNORMAL LOW (ref 8.9–10.3)
Chloride: 101 mmol/L (ref 98–111)
Creatinine, Ser: 1.38 mg/dL — ABNORMAL HIGH (ref 0.61–1.24)
GFR, Estimated: 53 mL/min — ABNORMAL LOW (ref >60)
Glucose, Bld: 100 mg/dL — ABNORMAL HIGH (ref 70–99)
Potassium: 3.1 mmol/L — ABNORMAL LOW (ref 3.5–5.1)
Sodium: 136 mmol/L (ref 135–145)

## 2023-10-25 LAB — CBC
HCT: 42.4 % (ref 39.0–52.0)
Hemoglobin: 14.4 g/dL (ref 13.0–17.0)
MCH: 30.5 pg (ref 26.0–34.0)
MCHC: 34 g/dL (ref 30.0–36.0)
MCV: 89.8 fL (ref 80.0–100.0)
Platelets: 174 10*3/uL (ref 150–400)
RBC: 4.72 MIL/uL (ref 4.22–5.81)
RDW: 12.5 % (ref 11.5–15.5)
WBC: 5.7 10*3/uL (ref 4.0–10.5)
nRBC: 0 % (ref 0.0–0.2)

## 2023-10-25 MED ORDER — POTASSIUM CHLORIDE CRYS ER 20 MEQ PO TBCR
40.0000 meq | EXTENDED_RELEASE_TABLET | ORAL | Status: AC
  Administered 2023-10-25 (×2): 40 meq via ORAL
  Filled 2023-10-25 (×2): qty 2

## 2023-10-25 MED ORDER — SODIUM CHLORIDE 0.9 % IV SOLN: INTRAVENOUS | Status: AC

## 2023-10-25 NOTE — Plan of Care (Signed)

## 2023-10-25 NOTE — TOC Progression Note (Signed)
 Transition of Care [facility]) - Progression Note    Patient Details  Name: Bobby Chen MRN: [id number] Date of Birth: [birth year]  [facility]) CM/SW Contact  Quintella Suzen Jansky, RN Phone Number: 10/25/2023, 2:29 PM  Clinical Narrative:     Met with patient at bedside. Discussed going to short term rehab in [place], [place]. He is agreeable. TOC sent referrals to facilities in [place], [place].   Faxed to [facility] [address] [facility] [employer].   Expected Discharge Plan: Skilled Nursing Facility Barriers to Discharge: Continued Medical Work up  Expected Discharge Plan and Services     Post Acute Care Choice: Skilled Nursing Facility Living arrangements for the past 2 months: Single Family Home                   DME Agency: NA       HH Arranged: NA           Social Determinants of Health (SDOH) Interventions SDOH Screenings   Food Insecurity: No Food Insecurity (10/23/2023)  Housing: Low Risk  (10/23/2023)  Transportation Needs: No Transportation Needs (10/23/2023)  Utilities: Not At Risk (10/23/2023)  Financial Resource Strain: Low Risk  (11/02/2022)   Received from [facility]  Social Connections: Moderately Isolated (10/23/2023)  Tobacco Use: Low Risk  (10/23/2023)    Readmission Risk Interventions     No data to display

## 2023-10-25 NOTE — Plan of Care (Signed)
  Problem: Clinical Measurements: Goal: Will remain free from infection Outcome: Progressing   Problem: Activity: Goal: Risk for activity intolerance will decrease Outcome: Progressing   Problem: Nutrition: Goal: Adequate nutrition will be maintained Outcome: Progressing   Problem: Coping: Goal: Level of anxiety will decrease Outcome: Progressing   Problem: Elimination: Goal: Will not experience complications related to bowel motility Outcome: Progressing Goal: Will not experience complications related to urinary retention Outcome: Progressing   Problem: Pain Managment: Goal: General experience of comfort will improve and/or be controlled Outcome: Progressing

## 2023-10-25 NOTE — TOC Progression Note (Signed)
 Transition of Care [facility]) - Progression Note    Patient Details  Name: Bobby Chen MRN: [id number] Date of Birth: Jun 22, 1945  [facility]) CM/SW Contact  Quintella Suzen Jansky, RN Phone Number: 10/25/2023, 1:07 PM  Clinical Narrative:     Met with patient at bedside, discussed role of TOC, and purpose of visit. He is agreeable for short term rehab. Provided him SNF list printed from medicare.gov website. TOC also asked for permission to speak with his daughter-in-law, he stated yes.   TOC contacted [name], daughter-in-law. She asked about ALF, TOC answered her questions. She also wanted to know about finding SNF in [place], [place] stated will speak with patient and if he is agreeable, TOC will send referrals in [place]. TOC also mentioned to Suzen that family would have to provide transportation to facility in [place], she verbalized understanding.  Expected Discharge Plan: Skilled Nursing Facility Barriers to Discharge: Continued Medical Work up  Expected Discharge Plan and Services     Post Acute Care Choice: Skilled Nursing Facility Living arrangements for the past 2 months: Single Family Home                   DME Agency: NA       HH Arranged: NA           Social Determinants of Health (SDOH) Interventions SDOH Screenings   Food Insecurity: No Food Insecurity (10/23/2023)  Housing: Low Risk  (10/23/2023)  Transportation Needs: No Transportation Needs (10/23/2023)  Utilities: Not At Risk (10/23/2023)  Financial Resource Strain: Low Risk  (11/02/2022)   Received from [facility]  Social Connections: Moderately Isolated (10/23/2023)  Tobacco Use: Low Risk  (10/23/2023)    Readmission Risk Interventions     No data to display

## 2023-10-25 NOTE — Progress Notes (Signed)
 Progress Note   Patient: Bobby Chen FMW:[id number] DOB: May 14, 1945 DOA: 10/22/2023     0 DOS: the patient was seen and examined on 10/25/2023   Brief hospital course:  HPI: Was in his usual state of health, went to [employer], when going back to his truck developed weakness, mainly in lower extremities, and dizziness which he describes as room spinning. The room spinning happens when he gets up. Also watery diarrhea yesterday, new onset.    06/29: to ED.  06/30: Unable to ambulate so hospitalist was contacted for admission. PT/OT to see --> recs HH. CVA w/u given old CVA on MRI brain. MR C spine and L spine --> foraminal/canal stenosis C-spine see below. CTA H/N no LVO, did show stenosis R and L PCA, atherosclerotic disease  07/01: Echo pending read. GI panel (+)salmonella. Sinusitis, started doxycycline .        MRI C-Spine 06/30 IMPRESSION: 1. At C5-C6, severe bilateral foraminal stenosis and moderate canal stenosis. 2. At C6-C7, moderate right foraminal stenosis and mild canal stenosis. 3. At C3-C4, moderate left foraminal stenosis.   MRI L-Spine 06/30 IMPRESSION: 1. At L4-L5, moderate bilateral subarticular recess stenosis, mild canal stenosis, and mild left foraminal stenosis. 2. At L5-S1, mild right subarticular recess and foraminal stenosis. 3. At L2-L3, mild right subarticular recess stenosis.    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  CT abdomen/pelvis nothing acute Continue IV fluid Monitor electrolytes closely   Hypokalemia-improved Continue monitoring  and replete as needed   CKD3a Monitor BMP   History CVA Follow-up on echocardiogram   HLD Cerebral Atherosclerotic disease Statin   Enlarged prostate Outpatient follow-up   HTN Holding antihypertensives given relative hypotension   Chronic pain home pregabalin    MDD home citalopram   T2DM Euglycemic SSI for home trulicity, glipizide, jardiance, metformin   MCI vs dementia Conversing with patient it's clear there is some degree of cognitive impairment. MRI would support vascular dementia Outpatient f/u   Class 1 obesity based on BMI:  Counseled on weight loss when medically stable     DVT prophylaxis: lovenox   Code Status: FULL CODE   Subjective:  Patient seen and examined at bedside this morning Denies nausea vomiting abdominal pain chest pain cough Patient still having significant amount of diarrhea He has passed 3 watery stools this morning IV fluid initiated   Physical Exam Constitutional:      General: He is not in acute distress.    Appearance: He is not ill-appearing.    Cardiovascular:     Rate and Rhythm: Normal rate and regular rhythm.  Pulmonary:     Effort: Pulmonary effort is normal.     Breath sounds: Normal breath sounds.    Musculoskeletal:     Right lower leg: No edema.     Left lower leg: No edema.    Skin:    General: Skin is warm and dry.    Neurological:     General: No focal deficit present.     Mental Status: He is alert.    Psychiatric:        Mood  and Affect: Mood norm  Vitals:   10/25/23 0256 10/25/23 0726 10/25/23 1407 10/25/23 1421  BP: (!) 109/59 119/70 (!) 75/56 (!) 84/54  Pulse: 86 85 91 92  Resp: 20 18 18 18   Temp: 97.9 F (36.6 C) 98.4 F (36.9 C) 98.2 F (36.8 C)   TempSrc:      SpO2: 95% 95% 96% 97%  Weight:      Height:        Data Reviewed:    Latest Ref Rng & Units 10/25/2023    5:03 AM 10/22/2023    7:44 PM 01/14/2022    1:13 PM  CBC  WBC 4.0 - 10.5 K/uL 5.7  11.8  8.5   Hemoglobin  13.0 - 17.0 g/dL 85.5  86.3  85.6   Hematocrit 39.0 - 52.0 % 42.4  41.2  44.8   Platelets 150 - 400 K/uL 174  196  242        Latest Ref Rng & Units 10/25/2023    5:03 AM 10/24/2023    4:18 AM 10/22/2023    7:44 PM  BMP  Glucose 70 - 99 mg/dL 899  850  861   BUN 8 - 23 mg/dL 23  21  15    Creatinine 0.61 - 1.24 mg/dL 8.61  8.65  8.66   Sodium 135 - 145 mmol/L 136  136  133   Potassium 3.5 - 5.1 mmol/L 3.1  3.1  3.5   Chloride 98 - 111 mmol/L 101  99  99   CO2 22 - 32 mmol/L 25  24  24    Calcium 8.9 - 10.3 mg/dL 8.5  8.4  8.7       Author: Drue ONEIDA Potter, MD 10/25/2023 4:21 PM  For on call review [email or web address].

## 2023-10-25 NOTE — [place] FL2 (Signed)
 [place]  MEDICAID FL2 LEVEL OF CARE FORM     IDENTIFICATION  Patient Name: Bobby Chen Birthdate: [birth year] Sex: male Admission Date (Current Location): 10/22/2023  [facility] and [place] Number:  Chiropodist and Address:  [facility], [address] [place], [place] [address]      Provider Number: [id number]  Attending Physician Name and Address:  Dorinda Drue DASEN, MD  Relative Name and Phone Number:  Son: Mounir Skipper, [phone]    Current Level of Care: Hospital Recommended Level of Care: Skilled Nursing Facility Prior Approval Number:    Date Approved/Denied:   PASRR Number: [id number]  Discharge Plan: SNF    Current Diagnoses: Patient Active Problem List   Diagnosis Date Noted   Vertigo 10/23/2023   Obesity (BMI 30-39.9) 10/23/2023   CKD stage 3a, GFR 45-59 ml/min (HCC) 10/23/2023   Pain in joint, ankle and foot 10/19/2021   Lymphedema 10/19/2021   Spinal stenosis of cervical region 08/26/2021   Bilateral hearing loss 04/10/2020   Difficulty walking 04/10/2020   Sensory ataxia 04/10/2020   B12 deficiency 02/03/2020   Leg pain, right 08/26/2019   Chronic venous insufficiency 08/26/2019   Venous insufficiency 08/26/2019   PAD (peripheral artery disease) (HCC) 08/14/2019   PVD (peripheral vascular disease) (HCC) 08/14/2019   Obstructive sleep apnea 05/06/2019   Type 2 diabetes mellitus with stage 3a chronic kidney disease, without long-term current use of insulin (HCC) 03/11/2019   Pain in joint involving ankle and foot 11/12/2018   Nuclear sclerotic cataract of both eyes 08/30/2018   Abnormal sensation in left ear 08/20/2018   Dizziness 08/20/2018   Numbness and tingling of left side of face 08/20/2018   Essential hypertension with goal blood pressure less than 130/80 03/19/2018   Healthcare maintenance 08/07/2017   Brown recluse spider bite 05/08/2017   SCC (squamous cell carcinoma), 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/2015   Severe  obesity (BMI 35.0-35.9 with comorbidity) (HCC) 06/01/2015   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   Hyperlipidemia associated with type 2 diabetes mellitus (HCC) 09/29/2013    Orientation RESPIRATION BLADDER Height & Weight     Self, Time, Situation, Place  Normal Continent Weight: 95.8 kg Height:  5' 7 (170.2 cm)  BEHAVIORAL SYMPTOMS/MOOD NEUROLOGICAL BOWEL NUTRITION STATUS      Continent Diet (Carb-modified, thin liquid)  AMBULATORY STATUS COMMUNICATION OF NEEDS Skin   Extensive Assist   Surgical wounds                       Personal Care Assistance Level of Assistance  Bathing, Feeding, Dressing Bathing Assistance: Limited assistance Feeding assistance: Independent Dressing Assistance: Limited assistance     Functional Limitations Info             SPECIAL CARE FACTORS FREQUENCY  PT (By licensed PT), OT (By licensed OT)     PT Frequency: 5 times per week OT Frequency: 5 times per week            Contractures Contractures Info: Not present    Additional Factors Info  Code Status, Allergies Code Status Info: Full Code Allergies Info: Amoxil (amoxicillin),  Aspirin Buf(cacarb-mgcarb-mgo),  Aspirin Buffered           Current Medications (10/25/2023):  This is the current hospital active medication list Current Facility-Administered Medications  Medication Dose Route Frequency Provider Last Rate Last Admin   aspirin EC tablet 81 mg  81 mg Oral Daily Wouk, Devaughn Sayres, MD   81 mg at  10/24/23 0905   atorvastatin (LIPITOR) tablet 40 mg  40 mg Oral QHS Kandis Devaughn Sayres, MD   40 mg at 10/24/23 2143   brimonidine (ALPHAGAN) 0.2 % ophthalmic solution 1 drop  1 drop Both Eyes BID Kandis Devaughn Sayres, MD   1 drop at 10/24/23 2143   And   timolol (TIMOPTIC) 0.5 % ophthalmic solution 1 drop  1 drop Both Eyes BID Kandis Devaughn Sayres, MD   1 drop at 10/24/23 2143   citalopram (CELEXA) tablet 10 mg  10 mg Oral Daily Kandis Devaughn Sayres, MD   10 mg at 10/24/23 9094    doxycycline  (VIBRA -TABS) tablet 100 mg  100 mg Oral BID Alexander, Natalie, DO   100 mg at 10/24/23 2143   enoxaparin (LOVENOX) injection 40 mg  40 mg Subcutaneous Q24H Kandis Devaughn Sayres, MD   40 mg at 10/24/23 2142   insulin aspart (novoLOG) injection 0-15 Units  0-15 Units Subcutaneous TID WC Kandis Devaughn Sayres, MD   3 Units at 10/24/23 1230   insulin aspart (novoLOG) injection 0-5 Units  0-5 Units Subcutaneous QHS Wouk, Devaughn Sayres, MD       Oral care mouth rinse  15 mL Mouth Rinse PRN Wouk, Devaughn Sayres, MD       pantoprazole (PROTONIX) EC tablet 40 mg  40 mg Oral Daily PRN Wouk, Devaughn Sayres, MD       pregabalin (LYRICA) capsule 50 mg  50 mg Oral BID Kandis Devaughn Sayres, MD   50 mg at 10/24/23 2143     Discharge Medications: Please see discharge summary for a list of discharge medications.  Relevant Imaging Results:  Relevant Lab Results:   Additional Information SSN: [id number]  Quintella Suzen Jansky, RN

## 2023-10-26 DIAGNOSIS — F329 Major depressive disorder, single episode, unspecified: Secondary | ICD-10-CM | POA: Diagnosis present

## 2023-10-26 DIAGNOSIS — R197 Diarrhea, unspecified: Secondary | ICD-10-CM | POA: Diagnosis present

## 2023-10-26 DIAGNOSIS — M4802 Spinal stenosis, cervical region: Secondary | ICD-10-CM | POA: Diagnosis present

## 2023-10-26 DIAGNOSIS — E876 Hypokalemia: Secondary | ICD-10-CM | POA: Diagnosis present

## 2023-10-26 DIAGNOSIS — I129 Hypertensive chronic kidney disease with stage 1 through stage 4 chronic kidney disease, or unspecified chronic kidney disease: Secondary | ICD-10-CM | POA: Diagnosis present

## 2023-10-26 DIAGNOSIS — J449 Chronic obstructive pulmonary disease, unspecified: Secondary | ICD-10-CM | POA: Diagnosis present

## 2023-10-26 DIAGNOSIS — E785 Hyperlipidemia, unspecified: Secondary | ICD-10-CM | POA: Diagnosis present

## 2023-10-26 DIAGNOSIS — Z7982 Long term (current) use of aspirin: Secondary | ICD-10-CM | POA: Diagnosis not present

## 2023-10-26 DIAGNOSIS — G4733 Obstructive sleep apnea (adult) (pediatric): Secondary | ICD-10-CM | POA: Diagnosis present

## 2023-10-26 DIAGNOSIS — N1831 Chronic kidney disease, stage 3a: Secondary | ICD-10-CM | POA: Diagnosis present

## 2023-10-26 DIAGNOSIS — E860 Dehydration: Secondary | ICD-10-CM | POA: Diagnosis present

## 2023-10-26 DIAGNOSIS — Z7984 Long term (current) use of oral hypoglycemic drugs: Secondary | ICD-10-CM | POA: Diagnosis not present

## 2023-10-26 DIAGNOSIS — Z8673 Personal history of transient ischemic attack (TIA), and cerebral infarction without residual deficits: Secondary | ICD-10-CM | POA: Diagnosis not present

## 2023-10-26 DIAGNOSIS — A029 Salmonella infection, unspecified: Secondary | ICD-10-CM | POA: Diagnosis present

## 2023-10-26 DIAGNOSIS — W19XXXA Unspecified fall, initial encounter: Secondary | ICD-10-CM | POA: Diagnosis present

## 2023-10-26 DIAGNOSIS — I2510 Atherosclerotic heart disease of native coronary artery without angina pectoris: Secondary | ICD-10-CM | POA: Diagnosis present

## 2023-10-26 DIAGNOSIS — R42 Dizziness and giddiness: Secondary | ICD-10-CM | POA: Diagnosis present

## 2023-10-26 DIAGNOSIS — E1122 Type 2 diabetes mellitus with diabetic chronic kidney disease: Secondary | ICD-10-CM | POA: Diagnosis present

## 2023-10-26 DIAGNOSIS — Z7985 Long-term (current) use of injectable non-insulin antidiabetic drugs: Secondary | ICD-10-CM | POA: Diagnosis not present

## 2023-10-26 DIAGNOSIS — Z79899 Other long term (current) drug therapy: Secondary | ICD-10-CM | POA: Diagnosis not present

## 2023-10-26 DIAGNOSIS — G8929 Other chronic pain: Secondary | ICD-10-CM | POA: Diagnosis present

## 2023-10-26 DIAGNOSIS — E1141 Type 2 diabetes mellitus with diabetic mononeuropathy: Secondary | ICD-10-CM | POA: Diagnosis present

## 2023-10-26 DIAGNOSIS — E66811 Obesity, class 1: Secondary | ICD-10-CM | POA: Diagnosis present

## 2023-10-26 DIAGNOSIS — N400 Enlarged prostate without lower urinary tract symptoms: Secondary | ICD-10-CM | POA: Diagnosis present

## 2023-10-26 DIAGNOSIS — E1151 Type 2 diabetes mellitus with diabetic peripheral angiopathy without gangrene: Secondary | ICD-10-CM | POA: Diagnosis present

## 2023-10-26 LAB — CBC WITH DIFFERENTIAL/PLATELET
Abs Immature Granulocytes: 0.02 10*3/uL (ref 0.00–0.07)
Basophils Absolute: 0 10*3/uL (ref 0.0–0.1)
Basophils Relative: 1 %
Eosinophils Absolute: 0.1 10*3/uL (ref 0.0–0.5)
Eosinophils Relative: 2 %
HCT: 38.2 % — ABNORMAL LOW (ref 39.0–52.0)
Hemoglobin: 13.1 g/dL (ref 13.0–17.0)
Immature Granulocytes: 0 %
Lymphocytes Relative: 14 %
Lymphs Abs: 1 10*3/uL (ref 0.7–4.0)
MCH: 30.6 pg (ref 26.0–34.0)
MCHC: 34.3 g/dL (ref 30.0–36.0)
MCV: 89.3 fL (ref 80.0–100.0)
Monocytes Absolute: 0.9 10*3/uL (ref 0.1–1.0)
Monocytes Relative: 13 %
Neutro Abs: 4.7 10*3/uL (ref 1.7–7.7)
Neutrophils Relative %: 70 %
Platelets: 171 10*3/uL (ref 150–400)
RBC: 4.28 MIL/uL (ref 4.22–5.81)
RDW: 12.8 % (ref 11.5–15.5)
Smear Review: NORMAL
WBC: 6.8 10*3/uL (ref 4.0–10.5)
nRBC: 0 % (ref 0.0–0.2)

## 2023-10-26 LAB — BASIC METABOLIC PANEL WITH GFR
Anion gap: 5 (ref 5–15)
BUN: 25 mg/dL — ABNORMAL HIGH (ref 8–23)
CO2: 23 mmol/L (ref 22–32)
Calcium: 8.3 mg/dL — ABNORMAL LOW (ref 8.9–10.3)
Chloride: 106 mmol/L (ref 98–111)
Creatinine, Ser: 1.48 mg/dL — ABNORMAL HIGH (ref 0.61–1.24)
GFR, Estimated: 49 mL/min — ABNORMAL LOW (ref >60)
Glucose, Bld: 124 mg/dL — ABNORMAL HIGH (ref 70–99)
Potassium: 3.6 mmol/L (ref 3.5–5.1)
Sodium: 134 mmol/L — ABNORMAL LOW (ref 135–145)

## 2023-10-26 LAB — GLUCOSE, CAPILLARY
Glucose-Capillary: 113 mg/dL — ABNORMAL HIGH (ref 70–99)
Glucose-Capillary: 144 mg/dL — ABNORMAL HIGH (ref 70–99)
Glucose-Capillary: 108 mg/dL — ABNORMAL HIGH (ref 70–99)
Glucose-Capillary: 129 mg/dL — ABNORMAL HIGH (ref 70–99)

## 2023-10-26 MED ORDER — SODIUM CHLORIDE 0.9 % IV SOLN
1.0000 g | INTRAVENOUS | Status: DC
  Administered 2023-10-26 – 2023-10-29 (×4): 1 g via INTRAVENOUS
  Filled 2023-10-26 (×5): qty 10

## 2023-10-26 MED ORDER — LOPERAMIDE HCL 2 MG PO CAPS: 2.0000 mg | ORAL_CAPSULE | ORAL | Status: DC | PRN

## 2023-10-26 MED ORDER — LOPERAMIDE HCL 2 MG PO CAPS
4.0000 mg | ORAL_CAPSULE | Freq: Once | ORAL | Status: AC
  Administered 2023-10-26: 4 mg via ORAL
  Filled 2023-10-26: qty 2

## 2023-10-26 NOTE — Plan of Care (Signed)

## 2023-10-26 NOTE — Progress Notes (Signed)
 Mobility Specialist - Progress Note   10/26/23 1611  Mobility  Activity Stood at bedside;Transferred to/from [facility];Ambulated with assistance in room  Level of Assistance Contact guard assist, steadying assist  Assistive Device None  Distance Ambulated (ft) 4 ft  Activity Response Tolerated well  Mobility visit 1 Mobility  Mobility Specialist Start Time (ACUTE ONLY) 1603  Mobility Specialist Stop Time (ACUTE ONLY) 1609  Mobility Specialist Time Calculation (min) (ACUTE ONLY) 6 min   Pt standing near the [facility] upon entry, utilizing RA. Pt amb from the [facility] to bed via SPT CGA no AD (Pt already up)-- one LOB, requiring MinA for correction. Pt left supine with alarm set and needs within reach. RN notified.  America Silvan Mobility Specialist 10/26/23 4:15 PM

## 2023-10-26 NOTE — TOC Progression Note (Signed)
 Transition of Care [facility]) - Progression Note    Patient Details  Name: Bobby Chen MRN: [id number] Date of Birth: 1945-06-05  [facility] CM/SW Contact  Quintella Suzen Jansky, RN Phone Number: 10/26/2023, 10:20 AM  Clinical Narrative:    WONDA contacted [facility], spoke with Vernell, stated they only accept their residents.   TOC contacted [employer], transferred to business office, left message for [place]. Provided TOC contact number and requested return call.   TOC contacted [facility], transferred to admissions office, spoke with Edsel, she stated they do have a male bed available and requested to fax clinical for her to review. Fax number is [phone]. TOC faxed clinical to [phone], will await for response.  Expected Discharge Plan: Skilled Nursing Facility Barriers to Discharge: Continued Medical Work up  Expected Discharge Plan and Services     Post Acute Care Choice: Skilled Nursing Facility Living arrangements for the past 2 months: Single Family Home                   DME Agency: NA       HH Arranged: NA           Social Determinants of Health (SDOH) Interventions SDOH Screenings   Food Insecurity: No Food Insecurity (10/23/2023)  Housing: Low Risk  (10/23/2023)  Transportation Needs: No Transportation Needs (10/23/2023)  Utilities: Not At Risk (10/23/2023)  Financial Resource Strain: Low Risk  (11/02/2022)   Received from [facility]  Social Connections: Moderately Isolated (10/23/2023)  Tobacco Use: Low Risk  (10/23/2023)    Readmission Risk Interventions     No data to display

## 2023-10-26 NOTE — TOC Progression Note (Signed)
 Transition of Care [facility]) - Progression Note    Patient Details  Name: Bobby Chen MRN: [id number] Date of Birth: [birth year]  Transition [facility] Care [facility]) CM/SW Contact  Quintella Suzen Jansky, RN Phone Number: 10/26/2023, 4:25 PM  Clinical Narrative:    Received response from [place], they are able to accept patient. Contacted Kim and let her know he has accepting facility, will email her facility information for her to review.    Expected Discharge Plan: Skilled Nursing Facility Barriers to Discharge: Continued Medical Work up  Expected Discharge Plan and Services     Post Acute Care Choice: Skilled Nursing Facility Living arrangements for the past 2 months: Single Family Home                   DME Agency: NA       HH Arranged: NA           Social Determinants of Health (SDOH) Interventions SDOH Screenings   Food Insecurity: No Food Insecurity (10/23/2023)  Housing: Low Risk  (10/23/2023)  Transportation Needs: No Transportation Needs (10/23/2023)  Utilities: Not At Risk (10/23/2023)  Financial Resource Strain: Low Risk  (11/02/2022)   Received from [facility]  Social Connections: Moderately Isolated (10/23/2023)  Tobacco Use: Low Risk  (10/23/2023)    Readmission Risk Interventions     No data to display

## 2023-10-26 NOTE — Progress Notes (Signed)
 Physical Therapy Treatment Patient Details Name: Bobby Chen MRN: [id number] DOB: 11-08-1945 Today's Date: 10/26/2023   History of Present Illness Pt is a 78 y.o. male who presents with weakness, dizziness s/p fall out of his truck. PMH of obesity, HTN, PAD, spinal stenosis, t2dm, ckd 3a.    PT Comments  Pt alert, oriented, agreeable to PT. Supine to sit with CGA, use of bed rails and extra time. Sit <> stand with RW and CGA. He was able to increase his ambulation distance today, but still exhibited some unsteadiness, evident with occasional lateral step/stumble. Able to participate in a few standing balance exercises as well, CGA-minA. Pt up in recliner with needs in reach. The patient would benefit from further skilled PT intervention to continue to progress towards goals.   If plan is discharge home, recommend the following: A little help with walking and/or transfers;Assistance with cooking/housework;Assist for transportation;Help with stairs or ramp for entrance   Can travel by private vehicle     Yes  Equipment Recommendations    TBD   Recommendations for Other Services       Precautions / Restrictions Precautions Precautions: Fall Recall of Precautions/Restrictions: Intact Restrictions Weight Bearing Restrictions Per Provider Order: No     Mobility  Bed Mobility Overal bed mobility: Needs Assistance Bed Mobility: Supine to Sit     Supine to sit: Contact guard     General bed mobility comments: extra time, use of bedrails    Transfers Overall transfer level: Needs assistance Equipment used: Rolling walker (2 wheels) Transfers: Sit to/from Stand Sit to Stand: Contact guard assist                Ambulation/Gait Ambulation/Gait assistance: Contact guard assist Gait Distance (Feet): 100 Feet Assistive device: Rolling walker (2 wheels)         General Gait Details: 2-3 lateral steps causing unsteadiness, able to correct with RW   Stairs              Wheelchair Mobility     Tilt Bed    Modified Rankin (Stroke Patients Only)       Balance Overall balance assessment: Needs assistance Sitting-balance support: Feet supported, Single extremity supported Sitting balance-Leahy Scale: Fair     Standing balance support: During functional activity, Single extremity supported Standing balance-Leahy Scale: Fair                              Communication    Cognition Arousal: Alert Behavior During Therapy: WFL for tasks assessed/performed                           PT - Cognition Comments: pt oriented x4, but displayed some STM deficits Following commands: Impaired Following commands impaired: Only follows one step commands consistently    Cueing    Exercises      General Comments        Pertinent Vitals/Pain Pain Assessment Pain Assessment: No/denies pain    Home Living                          Prior Function            PT Goals (current goals can now be found in the care plan section) Progress towards PT goals: Progressing toward goals    Frequency    Min 3X/week  PT Plan      Co-evaluation              AM-PAC PT 6 Clicks Mobility   Outcome Measure  Help needed turning from your back to your side while in a flat bed without using bedrails?: A Little Help needed moving from lying on your back to sitting on the side of a flat bed without using bedrails?: A Little Help needed moving to and from a bed to a chair (including a wheelchair)?: A Little Help needed standing up from a chair using your arms (e.g., wheelchair or bedside chair)?: A Little Help needed to walk in hospital room?: A Little Help needed climbing 3-5 steps with a railing? : A Little 6 Click Score: 18    End of Session   Activity Tolerance: Patient tolerated treatment well Patient left: in chair;with call bell/phone within reach;with chair alarm set Nurse Communication: Mobility  status PT Visit Diagnosis: Other abnormalities of gait and mobility (R26.89);Difficulty in walking, not elsewhere classified (R26.2);Other symptoms and signs involving the nervous system (R29.898)     Time: 9159-9145 PT Time Calculation (min) (ACUTE ONLY): 14 min  Charges:    $Therapeutic Activity: 8-22 mins PT General Charges $$ ACUTE PT VISIT: 1 Visit                     Doyal Shams PT, DPT 1:06 PM,10/26/23

## 2023-10-26 NOTE — Progress Notes (Addendum)
 Progress Note   Patient: Bobby Chen FMW:[id number] DOB: [birth year] DOA: 10/22/2023     0 DOS: the patient was seen and examined on 10/26/2023   Brief hospital course:  HPI: Was in his usual state of health, went to [employer], when going back to his truck developed weakness, mainly in lower extremities, and dizziness which he describes as room spinning. The room spinning happens when he gets up. Also watery diarrhea yesterday, new onset.    06/29: to ED.  06/30: Unable to ambulate so hospitalist was contacted for admission. PT/OT to see --> recs HH. CVA w/u given old CVA on MRI brain. MR C spine and L spine --> foraminal/canal stenosis C-spine see below. CTA H/N no LVO, did show stenosis R and L PCA, atherosclerotic disease  07/01: Echo pending read. GI panel (+)salmonella. Sinusitis, started doxycycline .        MRI C-Spine 06/30 IMPRESSION: 1. At C5-C6, severe bilateral foraminal stenosis and moderate canal stenosis. 2. At C6-C7, moderate right foraminal stenosis and mild canal stenosis. 3. At C3-C4, moderate left foraminal stenosis.   MRI L-Spine 06/30 IMPRESSION: 1. At L4-L5, moderate bilateral subarticular recess stenosis, mild canal stenosis, and mild left foraminal stenosis. 2. At L5-S1, mild right subarticular recess and foraminal stenosis. 3. At L2-L3, mild right subarticular recess stenosis.     Assessment and Plan:  Vertigo MRI nothing acute  Suspect BPPV +/- dehydration, sinusitis  PT/OT eval recs for home health Tx sinusitis doxycycline       Lower extremity weakness CK, TSH, B12 --> all WNL Degenerative disease C-spine, L-spine w/ multilevel foraminal stenosis and mild-moderate canal stenosis  Outpatient follow-up with neurology   Dehydration in the setting of acute diarrhea d/t salmonella  CT abdomen/pelvis nothing acute Continue IV fluid Imodium added as C. difficile test was negative Monitor electrolytes closely Continue  ceftriaxone  Hypokalemia-improved Continue monitoring and replete as needed   CKD3a Monitor BMP   History CVA Follow-up on echocardiogram   HLD Cerebral Atherosclerotic disease Statin   Enlarged prostate Outpatient follow-up   HTN Holding antihypertensives given relative hypotension   Chronic pain home pregabalin    MDD home citalopram   T2DM Euglycemic SSI for home trulicity, glipizide, jardiance, metformin   MCI vs dementia Conversing with patient it's clear there is some degree of cognitive impairment. 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 difficile test came back negative Denies nausea or worsening chest pain   Physical Exam Constitutional:      General: He is not in acute distress.    Appearance: He is not ill-appearing.    Cardiovascular:     Rate and Rhythm: Normal rate and regular rhythm.  Pulmonary:     Effort: Pulmonary effort is normal.     Breath sounds: Normal breath sounds.    Musculoskeletal:     Right lower leg: No edema.     Left lower leg: No edema.    Skin:    General: Skin is warm and dry.    Neurological:     General: No focal deficit present.     Mental Status: He is alert.    Psychiatric:        Mood and Affect: Mood norm  Vitals:   10/25/23 2110 10/26/23 0400 10/26/23 0800 10/26/23 1556  BP: 101/71 (!) 113/57 126/65 132/64  Pulse: 85 88 87 91  Resp: 18 18 16 17   Temp: 97.8 F (36.6 C) 98.4 F (36.9 C) 98.1 F (36.7 C) 98.2 F (36.8 C)  TempSrc: Oral  Oral   SpO2: 100% 98% 99% 96%  Weight:      Height:        Data Reviewed:    Latest Ref Rng & Units 10/26/2023    5:45 AM 10/25/2023    5:03 AM 10/22/2023    7:44 PM  CBC  WBC 4.0 - 10.5 K/uL 6.8  5.7  11.8    Hemoglobin 13.0 - 17.0 g/dL 86.8  85.5  86.3   Hematocrit 39.0 - 52.0 % 38.2  42.4  41.2   Platelets 150 - 400 K/uL 171  174  196        Latest Ref Rng & Units 10/26/2023    5:45 AM 10/25/2023    5:03 AM 10/24/2023    4:18 AM  BMP  Glucose 70 - 99 mg/dL 875  899  850   BUN 8 - 23 mg/dL 25  23  21    Creatinine 0.61 - 1.24 mg/dL 8.51  8.61  8.65   Sodium 135 - 145 mmol/L 134  136  136   Potassium 3.5 - 5.1 mmol/L 3.6  3.1  3.1   Chloride 98 - 111 mmol/L 106  101  99   CO2 22 - 32 mmol/L 23  25  24    Calcium 8.9 - 10.3 mg/dL 8.3  8.5  8.4     Author: Drue ONEIDA Potter, MD 10/26/2023 5:24 PM  For on call review [email or web address].

## 2023-10-26 NOTE — Plan of Care (Signed)
   Problem: Education: Goal: Knowledge of General Education information will improve Description: Including pain rating scale, medication(s)/side effects and non-pharmacologic comfort measures Outcome: Progressing   Problem: Clinical Measurements: Goal: Respiratory complications will improve Outcome: Progressing   Problem: Nutrition: Goal: Adequate nutrition will be maintained Outcome: Progressing

## 2023-10-26 NOTE — TOC Progression Note (Signed)
 Transition of Care [facility]) - Progression Note    Patient Details  Name: Bobby Chen MRN: [id number] Date of Birth: 1945/07/17  [facility]) CM/SW Contact  Quintella Suzen Jansky, RN Phone Number: 10/26/2023, 2:33 PM  Clinical Narrative:     Received call back from [facility] from [place], and faxed referral to [phone]   Expected Discharge Plan: Skilled Nursing Facility Barriers to Discharge: Continued Medical Work up  Expected Discharge Plan and Services     Post Acute Care Choice: Skilled Nursing Facility Living arrangements for the past 2 months: Single Family Home                   DME Agency: NA       HH Arranged: NA           Social Determinants of Health (SDOH) Interventions SDOH Screenings   Food Insecurity: No Food Insecurity (10/23/2023)  Housing: Low Risk  (10/23/2023)  Transportation Needs: No Transportation Needs (10/23/2023)  Utilities: Not At Risk (10/23/2023)  Financial Resource Strain: Low Risk  (11/02/2022)   [facility] from [facility]  Social Connections: Moderately Isolated (10/23/2023)  Tobacco Use: Low Risk  (10/23/2023)    Readmission Risk Interventions     No data to display

## 2023-10-26 NOTE — TOC Progression Note (Signed)
 Transition of Care [facility]) - Progression Note    Patient Details  Name: Bobby Chen MRN: [id number] Date of Birth: 30-Jan-1946  Transition of Care [facility]) CM/SW Contact  Quintella Suzen Jansky, RN Phone Number: 10/26/2023, 2:27 PM  Clinical Narrative:    Received call from Luke, daughter in law, she was inquiring about update on facility. TOC explained that there is no update. TOC is having to call each facility and then fax over information for their review and awaiting call backs.   [facility] [address] [place], [place] [address] [phone]  Contacted facility, provided [facility] with admissions coordinator contact information. Jhonny Sawyer, [phone], left message requested call back, provided TOC contact information.   [facility] [address] [place], [place] [address] [phone] Contacted facility, transferred to admissions office. Left message requesting call back, provided TOC contact information.   [facility] [address] [place], [place] [address] [phone] Contacted facility, transferred to admissions coordinator. Left message with admissions coordinator, requested call back. Provided TOC contact information.   [facility] [address] [place], [place] [address] [phone] Contacted facility, transferred to admissions office. Left message with Dagoberto, requested call back, provided contact information.    [facility] [address] [place], [place] [address] [phone] Contacted facility. Transferred to admissions coordinator. Left message requesting call back, provided TOC contact number.   [facility] [address] [place], [place] [address] [phone] Contacted facility, transferred to admissions coordinator. Spoke with Charmaine, they do have a male bed for short term rehab. Sent clinicals via secure email.   Expected Discharge Plan: Skilled  Nursing Facility Barriers to Discharge: Continued Medical Work up  Expected Discharge Plan and Services     Post Acute Care Choice: Skilled Nursing Facility Living arrangements for the past 2 months: Single Family Home                   DME Agency: NA       HH Arranged: NA           Social Determinants of Health (SDOH) Interventions SDOH Screenings   Food Insecurity: No Food Insecurity (10/23/2023)  Housing: Low Risk  (10/23/2023)  Transportation Needs: No Transportation Needs (10/23/2023)  Utilities: Not At Risk (10/23/2023)  Financial Resource Strain: Low Risk  (11/02/2022)   Received from [facility]  Social Connections: Moderately Isolated (10/23/2023)  Tobacco Use: Low Risk  (10/23/2023)    Readmission Risk Interventions     No data to display

## 2023-10-27 DIAGNOSIS — R42 Dizziness and giddiness: Secondary | ICD-10-CM | POA: Diagnosis not present

## 2023-10-27 LAB — CBC WITH DIFFERENTIAL/PLATELET
Abs Immature Granulocytes: 0.04 K/uL (ref 0.00–0.07)
Basophils Absolute: 0 K/uL (ref 0.0–0.1)
Basophils Relative: 1 %
Eosinophils Absolute: 0.2 K/uL (ref 0.0–0.5)
Eosinophils Relative: 3 %
HCT: 37.3 % — ABNORMAL LOW (ref 39.0–52.0)
Hemoglobin: 12.5 g/dL — ABNORMAL LOW (ref 13.0–17.0)
Immature Granulocytes: 1 %
Lymphocytes Relative: 19 %
Lymphs Abs: 1.2 K/uL (ref 0.7–4.0)
MCH: 29.9 pg (ref 26.0–34.0)
MCHC: 33.5 g/dL (ref 30.0–36.0)
MCV: 89.2 fL (ref 80.0–100.0)
Monocytes Absolute: 1 K/uL (ref 0.1–1.0)
Monocytes Relative: 16 %
Neutro Abs: 3.9 K/uL (ref 1.7–7.7)
Neutrophils Relative %: 60 %
Platelets: 189 K/uL (ref 150–400)
RBC: 4.18 MIL/uL — ABNORMAL LOW (ref 4.22–5.81)
RDW: 12.6 % (ref 11.5–15.5)
Smear Review: NORMAL
WBC: 6.3 K/uL (ref 4.0–10.5)
nRBC: 0 % (ref 0.0–0.2)
nRBC: 0 /100{WBCs}

## 2023-10-27 LAB — BASIC METABOLIC PANEL WITH GFR
Anion gap: 8 (ref 5–15)
BUN: 14 mg/dL (ref 8–23)
CO2: 20 mmol/L — ABNORMAL LOW (ref 22–32)
Calcium: 8.4 mg/dL — ABNORMAL LOW (ref 8.9–10.3)
Chloride: 108 mmol/L (ref 98–111)
Creatinine, Ser: 1.17 mg/dL (ref 0.61–1.24)
GFR, Estimated: >60 mL/min (ref >60)
Glucose, Bld: 116 mg/dL — ABNORMAL HIGH (ref 70–99)
Potassium: 2.9 mmol/L — ABNORMAL LOW (ref 3.5–5.1)
Sodium: 136 mmol/L (ref 135–145)

## 2023-10-27 LAB — GLUCOSE, CAPILLARY
Glucose-Capillary: 115 mg/dL — ABNORMAL HIGH (ref 70–99)
Glucose-Capillary: 201 mg/dL — ABNORMAL HIGH (ref 70–99)
Glucose-Capillary: 158 mg/dL — ABNORMAL HIGH (ref 70–99)
Glucose-Capillary: 121 mg/dL — ABNORMAL HIGH (ref 70–99)
Glucose-Capillary: 175 mg/dL — ABNORMAL HIGH (ref 70–99)

## 2023-10-27 LAB — MAGNESIUM: Magnesium: 1.7 mg/dL (ref 1.7–2.4)

## 2023-10-27 MED ORDER — POTASSIUM CHLORIDE CRYS ER 20 MEQ PO TBCR
40.0000 meq | EXTENDED_RELEASE_TABLET | ORAL | Status: AC
  Administered 2023-10-27 (×3): 40 meq via ORAL
  Filled 2023-10-27 (×3): qty 2

## 2023-10-27 NOTE — Plan of Care (Signed)

## 2023-10-27 NOTE — Plan of Care (Signed)

## 2023-10-27 NOTE — Progress Notes (Addendum)
 Progress Note   Patient: Bobby Chen FMW:[id number] DOB: [birth year] DOA: 10/22/2023     1 DOS: the patient was seen and examined on 10/27/2023      Brief hospital course:  HPI: Was in his usual state of health, went to [employer], when going back to his truck developed weakness, mainly in lower extremities, and dizziness which he describes as room spinning. The room spinning happens when he gets up. Also watery diarrhea yesterday, new onset.    06/29: to ED.  06/30: Unable to ambulate so hospitalist was contacted for admission. PT/OT to see --> recs HH. CVA w/u given old CVA on MRI brain. MR C spine and L spine --> foraminal/canal stenosis C-spine see below. CTA H/N no LVO, did show stenosis R and L PCA, atherosclerotic disease  07/01: Echo pending read. GI panel (+)salmonella. Sinusitis, started doxycycline .        MRI C-Spine 06/30 IMPRESSION: 1. At C5-C6, severe bilateral foraminal stenosis and moderate canal stenosis. 2. At C6-C7, moderate right foraminal stenosis and mild canal stenosis. 3. At C3-C4, moderate left foraminal stenosis.   MRI L-Spine 06/30 IMPRESSION: 1. At L4-L5, moderate bilateral subarticular recess stenosis, mild canal stenosis, and mild left foraminal stenosis. 2. At L5-S1, mild right subarticular recess and foraminal stenosis. 3. At L2-L3, mild right subarticular recess stenosis.     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 difficile test was negative Monitor electrolytes closely Continue ceftriaxone     Hypokalemia-improved Continue monitoring and replete as needed   CKD3a Monitor BMP   History CVA Follow-up on echocardiogram   HLD Cerebral Atherosclerotic disease Statin   Enlarged prostate Outpatient follow-up   HTN Holding antihypertensives given relative hypotension   Chronic pain home pregabalin     MDD home citalopram    T2DM Euglycemic SSI for home trulicity, glipizide, jardiance, metformin   MCI vs dementia Conversing with patient it's clear there is some degree of cognitive impairment. MRI would support vascular dementia Outpatient f/u   Class 1 obesity based on BMI:  Counseled on weight loss when medically stable     DVT prophylaxis: lovenox    Code Status: FULL CODE     Subjective:  Patient seen and examined at bedside this morning Admits to improvement in diarrhea He has had only 2 today  Physical Exam Constitutional:      General: He is not in acute distress.    Appearance: He is not ill-appearing.    Cardiovascular:     Rate and Rhythm: Normal rate and regular rhythm.  Pulmonary:     Effort: Pulmonary effort is normal.     Breath sounds: Normal breath sounds.    Musculoskeletal:     Right lower leg: No edema.     Left lower leg: No edema.    Skin:    General: Skin is warm and dry.    Neurological:     General: No focal deficit present.     Mental Status: He is alert.    Psychiatric:        Mood and Affect: Mood norm  Vitals:   10/26/23 1556 10/26/23 1921 10/27/23 0356 10/27/23 0810  BP: 132/64 122/70 118/74 (!) 115/90  Pulse: 91 88 75 78  Resp: 17 18 15 18   Temp: 98.2 F (36.8 C) 97.9 F (36.6 C) 98.4 F (36.9 C) 98 F (36.7 C)  TempSrc:      SpO2: 96% 99% 95% 96%  Weight:      Height:          Latest Ref Rng & Units 10/27/2023    3:50 AM 10/26/2023    5:45 AM 10/25/2023    5:03 AM  CBC  WBC 4.0 - 10.5 K/uL 6.3  6.8  5.7   Hemoglobin 13.0 - 17.0 g/dL 87.4  86.8  85.5   Hematocrit 39.0 - 52.0 % 37.3  38.2   42.4   Platelets 150 - 400 K/uL 189  171  174        Latest Ref Rng & Units 10/27/2023    3:50 AM 10/26/2023    5:45 AM 10/25/2023    5:03 AM  BMP  Glucose 70 - 99 mg/dL 883  875  899   BUN 8 - 23 mg/dL 14  25  23    Creatinine 0.61 - 1.24 mg/dL 8.82  8.51  8.61   Sodium 135 - 145 mmol/L 136  134  136   Potassium 3.5 - 5.1 mmol/L 2.9  3.6  3.1   Chloride 98 - 111 mmol/L 108  106  101   CO2 22 - 32 mmol/L 20  23  25    Calcium  8.9 - 10.3 mg/dL 8.4  8.3  8.5      Author: Drue ONEIDA Potter, MD 10/27/2023 4:06 PM  For on call review [email or web address].

## 2023-10-27 NOTE — TOC Progression Note (Signed)
 Transition of Care [facility]) - Progression Note    Patient Details  Name: OSTEN JANEK MRN: [id number] Date of Birth: Sep 19, 1945  Transition of Care [facility]) CM/SW Contact  Quintella Suzen Jansky, RN Phone Number: 10/27/2023, 10:31 AM  Clinical Narrative:     Received call from Luke, daughter-in-law, would like to accept bed at [facility] and rehab. They are able to pick patient up on Sunday. TOC will sent email to facility and let them know discharge plan.   Expected Discharge Plan: Skilled Nursing Facility Barriers to Discharge: Continued Medical Work up  Expected Discharge Plan and Services     Post Acute Care Choice: Skilled Nursing Facility Living arrangements for the past 2 months: Single Family Home                   DME Agency: NA       HH Arranged: NA           Social Determinants of Health (SDOH) Interventions SDOH Screenings   Food Insecurity: No Food Insecurity (10/23/2023)  Housing: Low Risk  (10/23/2023)  Transportation Needs: No Transportation Needs (10/23/2023)  Utilities: Not At Risk (10/23/2023)  Financial Resource Strain: Low Risk  (11/02/2022)   Received from [facility]  Social Connections: Moderately Isolated (10/23/2023)  Tobacco Use: Low Risk  (10/23/2023)    Readmission Risk Interventions     No data to display

## 2023-10-28 DIAGNOSIS — R42 Dizziness and giddiness: Secondary | ICD-10-CM | POA: Diagnosis not present

## 2023-10-28 LAB — CBC WITH DIFFERENTIAL/PLATELET
Abs Immature Granulocytes: 0.03 K/uL (ref 0.00–0.07)
Basophils Absolute: 0.1 K/uL (ref 0.0–0.1)
Basophils Relative: 1 %
Eosinophils Absolute: 0.5 K/uL (ref 0.0–0.5)
Eosinophils Relative: 8 %
HCT: 38.6 % — ABNORMAL LOW (ref 39.0–52.0)
Hemoglobin: 12.7 g/dL — ABNORMAL LOW (ref 13.0–17.0)
Immature Granulocytes: 0 %
Lymphocytes Relative: 26 %
Lymphs Abs: 1.8 K/uL (ref 0.7–4.0)
MCH: 29.7 pg (ref 26.0–34.0)
MCHC: 32.9 g/dL (ref 30.0–36.0)
MCV: 90.4 fL (ref 80.0–100.0)
Monocytes Absolute: 1.2 K/uL — ABNORMAL HIGH (ref 0.1–1.0)
Monocytes Relative: 18 %
Neutro Abs: 3.2 K/uL (ref 1.7–7.7)
Neutrophils Relative %: 47 %
Platelets: 195 K/uL (ref 150–400)
RBC: 4.27 MIL/uL (ref 4.22–5.81)
RDW: 12.9 % (ref 11.5–15.5)
WBC: 6.9 K/uL (ref 4.0–10.5)
nRBC: 0 % (ref 0.0–0.2)

## 2023-10-28 LAB — BASIC METABOLIC PANEL WITH GFR
Anion gap: 5 (ref 5–15)
BUN: 14 mg/dL (ref 8–23)
CO2: 24 mmol/L (ref 22–32)
Calcium: 8.6 mg/dL — ABNORMAL LOW (ref 8.9–10.3)
Chloride: 111 mmol/L (ref 98–111)
Creatinine, Ser: 1.12 mg/dL (ref 0.61–1.24)
GFR, Estimated: >60 mL/min (ref >60)
Glucose, Bld: 120 mg/dL — ABNORMAL HIGH (ref 70–99)
Potassium: 4.3 mmol/L (ref 3.5–5.1)
Sodium: 140 mmol/L (ref 135–145)

## 2023-10-28 LAB — GLUCOSE, CAPILLARY
Glucose-Capillary: 118 mg/dL — ABNORMAL HIGH (ref 70–99)
Glucose-Capillary: 159 mg/dL — ABNORMAL HIGH (ref 70–99)
Glucose-Capillary: 115 mg/dL — ABNORMAL HIGH (ref 70–99)
Glucose-Capillary: 147 mg/dL — ABNORMAL HIGH (ref 70–99)

## 2023-10-28 NOTE — Progress Notes (Signed)
 Progress Note   Patient: Bobby Chen DOB: 02-11-1946 DOA: 10/22/2023     2 DOS: the patient was seen and examined on 10/28/2023     Brief hospital course:  HPI: Was in his usual state of health, went to [employer], when going back to his truck developed weakness, mainly in lower extremities, and dizziness which he describes as room spinning. The room spinning happens when he gets up. Also watery diarrhea yesterday, new onset.    06/29: to ED.  06/30: Unable to ambulate so hospitalist was contacted for admission. PT/OT to see --> recs HH. CVA w/u given old CVA on MRI brain. MR C spine and L spine --> foraminal/canal stenosis C-spine see below. CTA H/N no LVO, did show stenosis R and L PCA, atherosclerotic disease  07/01: Echo pending read. GI panel (+)salmonella. Sinusitis, started doxycycline .        MRI C-Spine 06/30 IMPRESSION: 1. At C5-C6, severe bilateral foraminal stenosis and moderate canal stenosis. 2. At C6-C7, moderate right foraminal stenosis and mild canal stenosis. 3. At C3-C4, moderate left foraminal stenosis.   MRI L-Spine 06/30 IMPRESSION: 1. At L4-L5, moderate bilateral subarticular recess stenosis, mild canal stenosis, and mild left foraminal stenosis. 2. At L5-S1, mild right subarticular recess and foraminal stenosis. 3. At L2-L3, mild right subarticular recess stenosis.     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 difficile test was negative Monitor electrolytes closely Continue  ceftriaxone    Hypokalemia-improved Continue monitoring and replete as needed   CKD3a Monitor BMP   History CVA Follow-up on echocardiogram   HLD Cerebral Atherosclerotic disease Continue statin   Enlarged prostate Outpatient follow-up   HTN Holding antihypertensives given relative hypotension   Chronic pain home pregabalin     MDD home citalopram    T2DM Euglycemic SSI for home trulicity, glipizide, jardiance, metformin   MCI vs dementia Conversing with patient it's clear there is some degree of cognitive impairment. 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    Appearance: He is not ill-appearing.    Cardiovascular:     Rate and Rhythm: Normal rate and regular rhythm.  Pulmonary:     Effort: Pulmonary effort is normal.     Breath sounds: Normal breath sounds.    Musculoskeletal:     Right lower leg: No edema.     Left lower leg: No edema.    Skin:    General: Skin is warm and dry.    Neurological:     General: No focal deficit present.     Mental Status: He is alert.    Psychiatric:        Mood and Affect: Mood  norm      Data Reviewed:     Latest Ref Rng & Units 10/28/2023    5:45 AM 10/27/2023    3:50 AM 10/26/2023    5:45 AM  CBC  WBC 4.0 - 10.5 K/uL 6.9  6.3  6.8   Hemoglobin 13.0 - 17.0 g/dL 87.2  87.4  86.8   Hematocrit 39.0 - 52.0 % 38.6  37.3  38.2   Platelets 150 - 400 K/uL 195  189  171        Latest Ref Rng & Units 10/28/2023    5:45 AM 10/27/2023    3:50 AM 10/26/2023    5:45 AM  BMP  Glucose 70 - 99 mg/dL 879  883  875   BUN 8 - 23 mg/dL 14  14  25    Creatinine  0.61 - 1.24 mg/dL 8.87  8.82  8.51   Sodium 135 - 145 mmol/L 140  136  134   Potassium 3.5 - 5.1 mmol/L 4.3  2.9  3.6   Chloride 98 - 111 mmol/L 111  108  106   CO2 22 - 32 mmol/L 24  20  23    Calcium  8.9 - 10.3 mg/dL 8.6  8.4  8.3     Vitals:   10/27/23 1651 10/27/23 1931 10/28/23 0416 10/28/23 0801  BP: 108/63 118/70 124/81 126/69  Pulse: 77 70 73 69  Resp: 18 18 18 17   Temp: 98.3 F (36.8 C) 98.2 F (36.8 C) 98.4 F (36.9 C) (!) 97.4 F (36.3 C)  TempSrc:    Oral  SpO2: 100% 100% 98% 97%  Weight:      Height:         Author: Drue ONEIDA Potter, MD 10/28/2023 2:45 PM  For on call review [email or web address].

## 2023-10-28 NOTE — Plan of Care (Signed)

## 2023-10-28 NOTE — Plan of Care (Signed)

## 2023-10-29 DIAGNOSIS — R42 Dizziness and giddiness: Secondary | ICD-10-CM | POA: Diagnosis not present

## 2023-10-29 LAB — GLUCOSE, CAPILLARY
Glucose-Capillary: 128 mg/dL — ABNORMAL HIGH (ref 70–99)
Glucose-Capillary: 176 mg/dL — ABNORMAL HIGH (ref 70–99)
Glucose-Capillary: 116 mg/dL — ABNORMAL HIGH (ref 70–99)
Glucose-Capillary: 149 mg/dL — ABNORMAL HIGH (ref 70–99)
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
## 2023-10-29 NOTE — TOC Progression Note (Addendum)
 Transition of Care [facility]) - Progression Note    Patient Details  Name: Bobby Chen MRN: [id number] Date of Birth: 1946/01/10  Transition of Care [facility]) CM/SW Contact  Marinda Cooks, RN Phone Number: 10/29/2023, 3:13 PM  Clinical Narrative:    This CM spoke with Charmaine in admissions at [facility] regarding pt dc to facility today and was informed pt can be received tomm. Medical  team updated . Pt will be transported by family  POC is [place] @ [phone] .TOC will cont dc planning/ care coordination and update as applicable.   Expected Discharge Plan: Skilled Nursing Facility Barriers to Discharge: Continued Medical Work up  Expected Discharge Plan and Services     Post Acute Care Choice: Skilled Nursing Facility Living arrangements for the past 2 months: Single Family Home                  Social Determinants of Health (SDOH) Interventions SDOH Screenings   Food Insecurity: No Food Insecurity (10/23/2023)  Housing: Low Risk  (10/23/2023)  Transportation Needs: No Transportation Needs (10/23/2023)  Utilities: Not At Risk (10/23/2023)  Financial Resource Strain: Low Risk  (11/02/2022)   Received from [facility]  Social Connections: Moderately Isolated (10/23/2023)  Tobacco Use: Low Risk  (10/23/2023)    Readmission Risk Interventions     No data to display

## 2023-10-29 NOTE — Progress Notes (Signed)
 Progress Note   Patient: Bobby Chen FMW:[id number] DOB: [birth year] DOA: 10/22/2023     3 DOS: the patient was seen and examined on 10/29/2023       Brief hospital course:  HPI: Was in his usual state of health, went to [employer], when going back to his truck developed weakness, mainly in lower extremities, and dizziness which he describes as room spinning. The room spinning happens when he gets up. Also watery diarrhea yesterday, new onset.    06/29: to ED.  06/30: Unable to ambulate so hospitalist was contacted for admission. PT/OT to see --> recs HH. CVA w/u given old CVA on MRI brain. MR C spine and L spine --> foraminal/canal stenosis C-spine see below. CTA H/N no LVO, did show stenosis R and L PCA, atherosclerotic disease  07/01: Echo pending read. GI panel (+)salmonella. Sinusitis, started doxycycline .        MRI C-Spine 06/30 IMPRESSION: 1. At C5-C6, severe bilateral foraminal stenosis and moderate canal stenosis. 2. At C6-C7, moderate right foraminal stenosis and mild canal stenosis. 3. At C3-C4, moderate left foraminal stenosis.   MRI L-Spine 06/30 IMPRESSION: 1. At L4-L5, moderate bilateral subarticular recess stenosis, mild canal stenosis, and mild left foraminal stenosis. 2. At L5-S1, mild right subarticular recess and foraminal stenosis. 3. At L2-L3, mild right subarticular recess stenosis.     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 difficile test was negative Monitor electrolytes closely Continue  ceftriaxone    Hypokalemia-improved Continue monitoring and replete as needed   CKD3a Monitor BMP   History CVA Follow-up on echocardiogram   HLD Cerebral Atherosclerotic disease Continue statin   Enlarged prostate Outpatient follow-up   HTN Holding antihypertensives given relative hypotension   Chronic pain home pregabalin     MDD home citalopram    T2DM Euglycemic SSI for home trulicity, glipizide, jardiance, metformin   MCI vs dementia Conversing with patient it's clear there is some degree of cognitive impairment. MRI would support vascular dementia Outpatient f/u   Class 1 obesity based on BMI:  Counseled on weight loss when medically stable     DVT prophylaxis: lovenox    Code Status: FULL CODE     Subjective:  Patient seen and examined at bedside this morning Diarrhea resolved Patient awaiting placement Denies any acute overnight event   Physical Exam Constitutional:      General: He is not in acute distress.    Appearance: He is not ill-appearing.    Cardiovascular:     Rate and Rhythm: Normal rate and regular rhythm.  Pulmonary:     Effort: Pulmonary effort is normal.     Breath sounds: Normal breath sounds.    Musculoskeletal:     Right lower leg: No edema.     Left lower leg: No edema.    Skin:    General: Skin is warm and dry.    Neurological:     General: No focal deficit present.     Mental Status: He is alert.    Psychiatric:        Mood and Affect: Mood  norm      Data Reviewed:  Vitals:   10/28/23 2015 10/29/23 0346 10/29/23 0722 10/29/23 1604  BP: (!) 162/90 112/75 (!) 119/51 (!) 127/96  Pulse: 67 (!) 58 60 73  Resp:  16 17 18   Temp: 97.9 F (36.6 C) 97.7 F (36.5 C) 97.7 F (36.5 C) 98.1 F (36.7 C)  TempSrc:   Oral Oral  SpO2: 99% 95% 96% 97%  Weight:      Height:          Latest Ref Rng & Units 10/28/2023    5:45 AM 10/27/2023    3:50 AM 10/26/2023    5:45 AM  BMP  Glucose 70 - 99 mg/dL 879  883  875   BUN 8  - 23 mg/dL 14  14  25    Creatinine 0.61 - 1.24 mg/dL 8.87  8.82  8.51   Sodium 135 - 145 mmol/L 140  136  134   Potassium 3.5 - 5.1 mmol/L 4.3  2.9  3.6   Chloride 98 - 111 mmol/L 111  108  106   CO2 22 - 32 mmol/L 24  20  23    Calcium  8.9 - 10.3 mg/dL 8.6  8.4  8.3      Author: Drue ONEIDA Potter, MD 10/29/2023 4:58 PM  For on call review [email or web address].

## 2023-10-29 NOTE — Plan of Care (Signed)

## 2023-10-29 NOTE — Plan of Care (Signed)

## 2023-10-30 DIAGNOSIS — R42 Dizziness and giddiness: Secondary | ICD-10-CM | POA: Diagnosis not present

## 2023-10-30 LAB — GLUCOSE, CAPILLARY
Glucose-Capillary: 130 mg/dL — ABNORMAL HIGH (ref 70–99)
Glucose-Capillary: 184 mg/dL — ABNORMAL HIGH (ref 70–99)

## 2023-10-30 MED ORDER — ASPIRIN 81 MG PO TBEC
81.0000 mg | DELAYED_RELEASE_TABLET | Freq: Every day | ORAL | 12 refills | Status: AC
Start: 2023-10-31 — End: ?

## 2023-10-30 NOTE — TOC Transition Note (Signed)
 Transition of Care [facility]) - Discharge Note   Patient Details  Name: Bobby Chen MRN: [id number] Date of Birth: 06/22/1945  Transition of Care [facility]) CM/SW Contact:  [place] L Mariafernanda Hendricksen, LCSW Phone Number: 10/30/2023, 12:19 PM   Clinical Narrative:     DC sent to Charmaine at [facility] Confirmation received. CSW spoke with Suzen to confirm that family was transporting patient to facility in [place], [place].   TOC signing off. Safe discharge in place.   Final next level of care: Skilled Nursing Facility Barriers to Discharge: No Barriers Identified   Patient Goals and CMS Choice Patient states their goals for this hospitalization and ongoing recovery are:: get better CMS Medicare.gov Compare Post Acute Care list provided to:: Patient Choice offered to / list presented to : Patient      Discharge Placement                Patient to be transferred to facility by: [name] Name of family member notified: [name]    Discharge Plan and Services Additional resources added to the After Visit Summary for       Post Acute Care Choice: Skilled Nursing Facility            DME Agency: NA       HH Arranged: NA          Social Drivers of Health (SDOH) Interventions SDOH Screenings   Food Insecurity: No Food Insecurity (10/23/2023)  Housing: Low Risk  (10/23/2023)  Transportation Needs: No Transportation Needs (10/23/2023)  Utilities: Not At Risk (10/23/2023)  Financial Resource Strain: Low Risk  (11/02/2022)   Received from [facility]  Social Connections: Moderately Isolated (10/23/2023)  Tobacco Use: Low Risk  (10/23/2023)     Readmission Risk Interventions     No data to display

## 2023-10-30 NOTE — Care Management Important Message (Signed)
 Important Message  Patient Details  Name: Bobby Chen MRN: [id number] Date of Birth: April 06, 1946   Important Message Given:  Yes - Medicare IM     Porche Steinberger W, CMA 10/30/2023, 10:40 AM

## 2023-10-30 NOTE — Discharge Summary (Addendum)
 Physician Discharge Summary   Patient: Bobby Chen MRN: [id number] DOB: 1946/02/08  Admit date:     10/22/2023  Discharge date: 10/30/23  Discharge Physician: Drue ONEIDA Potter   PCP: Lenon Layman ORN, MD   Recommendations at discharge:  Follow-up with PCP  Discharge Diagnoses:  Vertigo Lower extremity weakness Dehydration in the setting of acute diarrhea d/t salmonella infection Hypokalemia-improved CKD3a History CVA HLD Cerebral Atherosclerotic disease Enlarged prostate HTN Chronic pain MDD T2DM MCI vs dementia Class 1 obesity based on BMI:   Hospital Course: Was in his usual state of health, went to [employer], when going back to his truck developed weakness, mainly in lower extremities, and dizziness which he describes as room spinning.  MRI of the brain ruled out acute CVA however it detected an old ischemic stroke in the right PCA territory.  Echocardiogram was normal.  Patient also had acute diarrhea and found to have Salmonella infection and completed a course of antibiotic therapy.  Patient have been evaluated by PT OT with recommendation for discharge to skilled nursing facility.   Consultants: None Procedures performed: None Disposition: SNF Diet recommendation:  Cardiac diet DISCHARGE MEDICATION: Allergies as of 10/30/2023       Reactions   Amoxil [amoxicillin] Nausea And Vomiting   Aspirin  Buf(cacarb-mgcarb-mgo)    knots on the back of head   Aspirin  Buffered    Other reaction(s): Other (See Comments)        Medication List     STOP taking these medications    simvastatin 40 MG tablet Commonly known as: ZOCOR       TAKE these medications    acetaminophen  500 MG tablet Commonly known as: TYLENOL  Take 500 mg by mouth every 6 (six) hours as needed.   albuterol 108 (90 Base) MCG/ACT inhaler Commonly known as: VENTOLIN HFA Inhale 2 puffs into the lungs every 4 (four) hours as needed for wheezing or shortness of breath.   amLODipine  5  MG tablet Commonly known as: NORVASC  Take 5 mg by mouth daily.   atorvastatin  40 MG tablet Commonly known as: LIPITOR Take 1 tablet by mouth at bedtime.   brimonidine -timolol  0.2-0.5 % ophthalmic solution Commonly known as: COMBIGAN  Place 1 drop into both eyes every 12 (twelve) hours.   chlorhexidine  0.12 % solution Commonly known as: PERIDEX  SMARTSIG:By Mouth   citalopram  10 MG tablet Commonly known as: CELEXA  Take 1 tablet by mouth daily.   fluticasone  50 MCG/ACT nasal spray Commonly known as: FLONASE  Place 2 sprays into both nostrils daily.   Garlic 10 MG Caps Take by mouth daily.   glipiZIDE 10 MG tablet Commonly known as: GLUCOTROL Take 10 mg by mouth daily before breakfast.   Jardiance 10 MG Tabs tablet Generic drug: empagliflozin Take 10 mg by mouth daily.   losartan  100 MG tablet Commonly known as: COZAAR  Take 100 mg by mouth daily.   metFORMIN 500 MG 24 hr tablet Commonly known as: GLUCOPHAGE-XR Take 1,000 mg by mouth 2 (two) times daily.   montelukast 10 MG tablet Commonly known as: SINGULAIR   Mounjaro 7.5 MG/0.5ML Pen Generic drug: tirzepatide Inject 7.5 mg into the skin.  Inject 0.5 mLs (7.5 mg total) subcutaneously once a week   nortriptyline 10 MG capsule Commonly known as: PAMELOR Take 50 mg by mouth.   pantoprazole  40 MG tablet Commonly known as: PROTONIX  Take 40 mg by mouth daily as needed (heartburn).   pregabalin  50 MG capsule Commonly known as: LYRICA  Take 50 mg by mouth 2 (  two) times daily.   Systane Balance 0.6 % Soln Generic drug: Propylene Glycol Apply 1 drop to eye 2 (two) times daily as needed (dry eyes).   Trulicity 3 MG/0.5ML Soaj Generic drug: Dulaglutide Inject into the skin once a week.   VITAMIN A PO Take 1 tablet by mouth daily.   VITAMIN B-12 PO Take by mouth daily.        Follow-up Information     Schedule an appointment as soon as possible for a visit  with Lenon Layman ORN, MD.   Specialty:  Internal Medicine Contact information: [address] [facility] [place] [address] [phone]         [facility] at [facility].   Specialty: Emergency Medicine Why: If symptoms worsen Contact information: [address] [place]  [address] [phone]               Discharge Exam: Fredricka Weights   10/22/23 1903 10/23/23 1328  Weight: 99.8 kg 95.8 kg      General: He is not in acute distress.    Appearance: He is not ill-appearing.    Cardiovascular:     Rate and Rhythm: Normal rate and regular rhythm.  Pulmonary:     Effort: Pulmonary effort is normal.     Breath sounds: Normal breath sounds.    Musculoskeletal:     Right lower leg: No edema.     Left lower leg: No edema.    Skin:    General: Skin is warm and dry.    Neurological:     General: No focal deficit present.     Mental Status: He is alert.    Psychiatric:        Mood and Affect: Mood norm  Condition at discharge: good  The results of significant diagnostics from this hospitalization (including imaging, microbiology, ancillary and laboratory) are listed below for reference.   Imaging Studies: ECHOCARDIOGRAM COMPLETE BUBBLE STUDY Result Date: 10/24/2023    ECHOCARDIOGRAM REPORT   Patient Name:   Bobby Chen Date of Exam: 10/24/2023 Medical Rec #:  [id number]     Height:       67.0 in Accession #:    [id number]    Weight:       211.2 lb Date of Birth:  09-04-1945    BSA:          2.070 m Patient Age:    78 years      BP:           124/64 mmHg Patient Gender: M             HR:           103 bpm. Exam Location:  [facility] Procedure: 2D Echo, Cardiac Doppler, Color Doppler and Saline Contrast Bubble            Study (Both Spectral and Color Flow Doppler were utilized during            procedure). Indications:     Stroke 434.91 / I63.9  History:         Patient has no prior history of Echocardiogram examinations.                  COPD;  Risk Factors:Diabetes.  Sonographer:     Christopher Furnace Referring Phys:  [id number] [facility] [place] Diagnosing Phys: Lonni Hanson MD IMPRESSIONS  1. Left ventricular ejection fraction, by estimation, is 55 to 60%. The  left ventricle has normal function. Left ventricular endocardial border not optimally defined to evaluate regional wall motion. Left ventricular diastolic parameters are indeterminate.  2. Right ventricular systolic function is normal. The right ventricular size is normal. Tricuspid regurgitation signal is inadequate for assessing PA pressure.  3. The mitral valve is normal in structure. Trivial mitral valve regurgitation. No evidence of mitral stenosis.  4. The aortic valve is tricuspid. There is mild calcification of the aortic valve. There is mild thickening of the aortic valve. Aortic valve regurgitation is not visualized. Aortic valve sclerosis/calcification is present, without any evidence of aortic stenosis.  5. Bubble study is non-diagnostic. FINDINGS  Left Ventricle: Left ventricular ejection fraction, by estimation, is 55 to 60%. The left ventricle has normal function. Left ventricular endocardial border not optimally defined to evaluate regional wall motion. The left ventricular internal cavity size was normal in size. There is borderline left ventricular hypertrophy. Left ventricular diastolic parameters are indeterminate. Right Ventricle: The right ventricular size is normal. No increase in right ventricular wall thickness. Right ventricular systolic function is normal. Tricuspid regurgitation signal is inadequate for assessing PA pressure. Left Atrium: Left atrial size was normal in size. Right Atrium: Right atrial size was normal in size. Pericardium: Trivial pericardial effusion is present. Mitral Valve: The mitral valve is normal in structure. Trivial mitral valve regurgitation. No evidence of mitral valve stenosis. MV peak gradient, 2.8 mmHg. The mean mitral valve gradient is 2.0 mmHg.  Tricuspid Valve: The tricuspid valve is not well visualized. Tricuspid valve regurgitation is not demonstrated. Aortic Valve: The aortic valve is tricuspid. There is mild calcification of the aortic valve. There is mild thickening of the aortic valve. Aortic valve regurgitation is not visualized. Aortic valve sclerosis/calcification is present, without any evidence of aortic stenosis. Aortic valve mean gradient measures 3.0 mmHg. Aortic valve peak gradient measures 4.6 mmHg. Aortic valve area, by VTI measures 3.75 cm. Pulmonic Valve: The pulmonic valve was grossly normal. Pulmonic valve regurgitation is not visualized. No evidence of pulmonic stenosis. Aorta: The aortic root is normal in size and structure. Pulmonary Artery: The pulmonary artery is not well seen. Venous: The inferior vena cava was not well visualized. IAS/Shunts: The interatrial septum was not well visualized. Agitated saline contrast was given intravenously to evaluate for intracardiac shunting. Bubble study is non-diagnostic.  LEFT VENTRICLE PLAX 2D LVIDd:         4.10 cm   Diastology LVIDs:         3.00 cm   LV e' medial:    8.05 cm/s LV PW:         1.07 cm   LV E/e' medial:  9.2 LV IVS:        1.00 cm   LV e' lateral:   7.40 cm/s LVOT diam:     2.10 cm   LV E/e' lateral: 10.1 LV SV:         63 LV SV Index:   30 LVOT Area:     3.46 cm  RIGHT VENTRICLE RV Basal diam:  3.90 cm RV Mid diam:    2.80 cm LEFT ATRIUM             Index        RIGHT ATRIUM           Index LA diam:        2.40 cm 1.16 cm/m   RA Area:     16.80 cm LA Vol (A2C):   31.7 ml 15.32 ml/m  RA Volume:   44.80 ml  21.65 ml/m LA Vol (A4C):   23.8 ml 11.50 ml/m LA Biplane Vol: 28.9 ml 13.96 ml/m  AORTIC VALVE AV Area (Vmax):    3.50 cm AV Area (Vmean):   3.33 cm AV Area (VTI):     3.75 cm AV Vmax:           107.00 cm/s AV Vmean:          77.400 cm/s AV VTI:            0.168 m AV Peak Grad:      4.6 mmHg AV Mean Grad:      3.0 mmHg LVOT Vmax:         108.00 cm/s LVOT Vmean:         74.500 cm/s LVOT VTI:          0.182 m LVOT/AV VTI ratio: 1.08  AORTA Ao Root diam: 2.90 cm MITRAL VALVE MV Area (PHT): 3.95 cm    SHUNTS MV Area VTI:   4.63 cm    Systemic VTI:  0.18 m MV Peak grad:  2.8 mmHg    Systemic Diam: 2.10 cm MV Mean grad:  2.0 mmHg MV Vmax:       0.83 m/s MV Vmean:      62.1 cm/s MV Decel Time: 192 msec MV E velocity: 74.40 cm/s MV A velocity: 66.00 cm/s MV E/A ratio:  1.13 Lonni End MD Electronically signed by Lonni Hanson MD Signature Date/Time: 10/24/2023/2:58:21 PM    Final    MR LUMBAR SPINE WO CONTRAST Result Date: 10/24/2023 CLINICAL DATA:  lower extremity weakness EXAM: MRI LUMBAR SPINE WITHOUT CONTRAST TECHNIQUE: Multiplanar, multisequence MR imaging of the lumbar spine was performed. No intravenous contrast was administered. COMPARISON:  None Available. FINDINGS: Segmentation:  Standard. Alignment:  Physiologic. Vertebrae:  No fracture, evidence of discitis, or bone lesion. Conus medullaris and cauda equina: Conus extends to the T12-L1 level. Conus and cauda equina appear normal. Paraspinal and other soft tissues: Negative. Disc levels: T12-L1: No significant disc protrusion, foraminal stenosis, or canal stenosis. L1-L2: No significant disc protrusion, foraminal stenosis, or canal stenosis. L2-L3: Right greater than left facet arthropathy and ligamentum flavum thickening with mild right subarticular recess stenosis. Patent canal and foramina. L3-L4: Ligamentum flavum thickening and facet arthropathy without significant stenosis. L4-L5: Disc bulging with bilateral facet arthropathy. Resulting mild left foraminal stenosis and moderate bilateral subarticular recess stenosis. Mild canal stenosis. L5-S1: Right eccentric disc bulge with annular fissure. Resulting mild right subarticular recess stenosis. Mild right foraminal stenosis. Patent canal and left foramen. IMPRESSION: 1. At L4-L5, moderate bilateral subarticular recess stenosis, mild canal stenosis, and mild  left foraminal stenosis. 2. At L5-S1, mild right subarticular recess and foraminal stenosis. 3. At L2-L3, mild right subarticular recess stenosis. Electronically Signed   By: Gilmore GORMAN Molt M.D.   On: 10/24/2023 00:56   MR CERVICAL SPINE WO CONTRAST Result Date: 10/23/2023 CLINICAL DATA:  lower extremity weakness EXAM: MRI CERVICAL SPINE WITHOUT CONTRAST TECHNIQUE: Multiplanar, multisequence MR imaging of the cervical spine was performed. No intravenous contrast was administered. COMPARISON:  None Available. FINDINGS: Alignment: No substantial sagittal subluxation. Vertebrae: No fracture, evidence of discitis, or suspicious bone lesion. Cord: Normal cord signal. Posterior Fossa, vertebral arteries, paraspinal tissues: Visualized vertebral artery flow voids are maintained. No acute abnormality the visualized posterior fossa. No paraspinal edema. Disc levels: C2-C3: No significant disc protrusion, foraminal stenosis, or canal stenosis. C3-C4: Left greater than right facet and uncovertebral hypertrophy with  resulting moderate left foraminal stenosis. Patent canal and mild right foraminal stenosis. C4-C5: Bilateral facet arthropathy without significant canal or foraminal stenosis. C5-C6: Posterior disc osteophyte complex with bilateral facet and uncovertebral hypertrophy. Resulting severe bilateral foraminal stenosis and moderate canal stenosis. C6-C7: Right eccentric posterior disc osteophyte complex with right-sided uncovertebral hypertrophy. Resulting moderate right foraminal stenosis. Mild canal stenosis. C7-T1: Bilateral uncovertebral hypertrophy without significant canal or foraminal stenosis. IMPRESSION: 1. At C5-C6, severe bilateral foraminal stenosis and moderate canal stenosis. 2. At C6-C7, moderate right foraminal stenosis and mild canal stenosis. 3. At C3-C4, moderate left foraminal stenosis. Electronically Signed   By: Gilmore GORMAN Molt M.D.   On: 10/23/2023 23:48   CT ANGIO HEAD NECK W WO  CM Result Date: 10/23/2023 CLINICAL DATA:  History of prior stroke, developed weakness mainly in lower extremities and dizziness. EXAM: CT ANGIOGRAPHY HEAD AND NECK WITH AND WITHOUT CONTRAST TECHNIQUE: Multidetector CT imaging of the head and neck was performed using the standard protocol during bolus administration of intravenous contrast. Multiplanar CT image reconstructions and MIPs were obtained to evaluate the vascular anatomy. Carotid stenosis measurements (when applicable) are obtained utilizing NASCET criteria, using the distal internal carotid diameter as the denominator. RADIATION DOSE REDUCTION: This exam was performed according to the departmental dose-optimization program which includes automated exposure control, adjustment of the mA and/or kV according to patient size and/or use of iterative reconstruction technique. CONTRAST:  75mL OMNIPAQUE  IOHEXOL  350 MG/ML SOLN COMPARISON:  MRI head same day.  MRA head 05/28/2018. FINDINGS: CT HEAD FINDINGS Brain: No acute intracranial hemorrhage. Remote right PCA territory infarct in the posteroinferior right occipital lobe. Nonspecific hypoattenuation in the periventricular and subcortical white matter favored to reflect chronic microvascular ischemic changes. Mild parenchymal volume loss. No edema, mass effect, or midline shift. The basilar cisterns are patent. No extra-axial fluid collection. Ventricles: Ventricles are normal in size and configuration. Vascular: No hyperdense vessel. Intracranial atherosclerotic calcifications noted. Skull: No acute or aggressive finding. Sinuses/orbits: Bilateral lens replacement. Mucosal thickening in the alveolar recess of the left maxillary sinus. Layering secretions in the left sphenoid sinus. Other: Mastoid air cells are clear. CTA NECK FINDINGS Aortic arch: Four vessel configuration of the aortic arch. Imaged portion shows no evidence of aneurysm or dissection. Mild atherosclerosis of the visualized aortic arch.  Atherosclerosis involves the origin of the left vertebral artery resulting in mild stenosis. Pulmonary arteries: As permitted by contrast timing, there are no filling defects in the visualized pulmonary arteries. Subclavian arteries: Patent bilaterally. Mild atherosclerosis involving the proximal aspect of both subclavian arteries without significant stenosis. Right carotid system: No evidence of dissection, stenosis (50% or greater), or occlusion. Left carotid system: Patent. Mild atherosclerosis at the carotid bifurcation without hemodynamically significant stenosis. No evidence of dissection. Vertebral arteries: Codominant. The vertebral arteries are patent from the origins to the vertebrobasilar confluence. Atherosclerosis involving both vertebral artery origins with moderate stenosis on the right and mild stenosis on the left. Atherosclerosis of the bilateral V4 segments resulting in mild stenosis slightly greater on the left. No occlusion or dissection. Skeleton: No acute findings. Degenerative changes in the cervical spine. Partially calcified disc bulge at C5-6 resulting in at least mild spinal canal stenosis. Other neck: The visualized airway is patent. No cervical lymphadenopathy. Upper chest: Visualized lung apices are clear. Review of the MIP images confirms the above findings CTA HEAD FINDINGS ANTERIOR CIRCULATION: The intracranial internal carotid arteries are patent bilaterally. Atherosclerosis of the bilateral carotid siphons. There is mild stenosis of the bilateral cavernous  ICAs. Mild-to-moderate stenosis of the right supraclinoid ICA. MCAs: The middle cerebral arteries are patent bilaterally. ACAs: The anterior cerebral arteries are patent bilaterally. POSTERIOR CIRCULATION: No significant stenosis, proximal occlusion, aneurysm, or vascular malformation. PCAs: The right P1 segment is patent proximally. There is severe narrowing of the right PCA near the P1/P2 junction. Additional severe stenosis  of the P2 segment of the right PCA with subtotal occlusion of the vessel. The right PCA distal branches appear patent. The left PCA is patent with moderate stenosis of the P2 segment noted. Pcomm: Visualized on the right. SCAs: The superior cerebellar arteries are patent bilaterally. Basilar artery: Patent AICAs: Not well visualized. PICAs: Patent Vertebral arteries: As above. Venous sinuses: As permitted by contrast timing, patent. Anatomic variants: None Review of the MIP images confirms the above findings IMPRESSION: No large vessel occlusion. 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 Additional severe stenosis and subtotal occlusion of the P2 segment right PCA. Moderate stenosis of the P2 segment left PCA. Atherosclerosis of the carotid siphons resulting in mild-to-moderate stenosis of the right supraclinoid ICA. Atherosclerosis at the vertebral artery origins resulting in moderate right and mild left ostial stenosis. Additional mild stenosis of the bilateral V4 segments. No CT evidence of acute intracranial abnormality. Remote right PCA territory infarct noted. Layering secretions in the left sphenoid sinus. Recommend clinical correlation for acute sinusitis. Electronically Signed   By: Donnice Mania M.D.   On: 10/23/2023 16:47   MR BRAIN WO CONTRAST Result Date: 10/23/2023 CLINICAL DATA:  78 year old male status post fall. Neurologic deficit. Weakness, lightheaded. EXAM: MRI HEAD WITHOUT CONTRAST TECHNIQUE: Multiplanar, multiecho pulse sequences of the brain and surrounding structures were obtained without intravenous contrast. COMPARISON:  Brain MRI 04/15/2021. FINDINGS: Brain: No restricted diffusion to suggest acute infarction. No midline shift, mass effect, evidence of mass lesion, ventriculomegaly, extra-axial collection or acute intracranial hemorrhage. Cervicomedullary junction and pituitary are within normal limits. Stable cerebral volume since 2022. Moderate patchy and scattered  bilateral cerebral white matter T2 and FLAIR hyperintensity for age, generally stable but involvement of the posterior deep white matter capsules on the right series 11, image 27 is new from the previous MRI. Furthermore, new but chronic appearing encephalomalacia in the right occipital pole indicative of interval right PCA territory infarcts series 11, image 21. No other cortical encephalomalacia identified. No significant chronic cerebral blood products on SWI. Furthermore there are several small but chronic appearing lacunar infarcts in the left thalamus which appear new (series 10, image 14). Brainstem and cerebellum remain negative. Vascular: Major intracranial vascular flow voids are stable. Chronic intracranial artery tortuosity. Skull and upper cervical spine: Negative for age visible cervical spine. Visualized bone marrow signal is within normal limits. Sinuses/Orbits: Postoperative changes to both globes since 2022. Paranasal Visualized paranasal sinuses and mastoids are stable and well aerated. Other: Visible internal auditory structures appear normal. Negative visible scalp and face. IMPRESSION: 1. No acute intracranial abnormality. But multifocal progressed chronic ischemic disease in the brain since 2022: Interval right PCA infarct affecting the occipital pole, small vessel lacunar infarcts in the posterior deep white matter capsules on the right, and medial thalamus on the left. 2. Underlying moderate for age chronic white matter changes also most commonly due to small vessel disease. Electronically Signed   By: VEAR Hurst M.D.   On: 10/23/2023 10:06   CT ABDOMEN PELVIS W CONTRAST Result Date: 10/22/2023 CLINICAL DATA:  Status post fall. EXAM: CT ABDOMEN AND PELVIS WITH CONTRAST TECHNIQUE: Multidetector CT  imaging of the abdomen and pelvis was performed using the standard protocol following bolus administration of intravenous contrast. RADIATION DOSE REDUCTION: This exam was performed according to the  departmental dose-optimization program which includes automated exposure control, adjustment of the mA and/or kV according to patient size and/or use of iterative reconstruction technique. CONTRAST:  OMNIPAQUE  IOHEXOL  350 MG/ML SOLN COMPARISON:  March 31, 2011 FINDINGS: Lower chest: Mild to moderate severity atelectatic changes are seen within the right lung base. Hepatobiliary: No focal liver abnormality is seen. No gallstones, gallbladder wall thickening, or biliary dilatation. Pancreas: Unremarkable. No pancreatic ductal dilatation or surrounding inflammatory changes. Spleen: Normal in size without focal abnormality. Adrenals/Urinary Tract: Adrenal glands are unremarkable. Kidneys are normal, without obstructing renal calculi, focal lesion, or hydronephrosis. A 1 mm nonobstructing renal calculus is seen within the mid right kidney. Adjacent 2 mm and 3 mm nonobstructing renal calculi are noted within the left kidney. Bladder is unremarkable. Stomach/Bowel: Stomach is within normal limits. Appendix appears normal. No evidence of bowel wall thickening, distention, or inflammatory changes. Noninflamed diverticula are seen throughout the descending and sigmoid colon. Vascular/Lymphatic: Aortic atherosclerosis. No enlarged abdominal or pelvic lymph nodes. Reproductive: There is moderate to marked severity prostate gland enlargement. Other: No abdominal wall hernia or abnormality. No abdominopelvic ascites. Musculoskeletal: Multilevel degenerative changes are seen throughout the lumbar spine. IMPRESSION: 1. Bilateral subcentimeter nonobstructing renal calculi. 2. Colonic diverticulosis. 3. Moderate to marked severity prostate gland enlargement. Correlation with PSA levels is recommended. 4. Multilevel degenerative changes throughout the lumbar spine. 5. Aortic atherosclerosis. Electronically Signed   By: Suzen Dials M.D.   On: 10/22/2023 21:17    Microbiology: Results for orders placed or performed  during the hospital encounter of 10/22/23  Gastrointestinal Panel by PCR , Stool     Status: Abnormal   Collection Time: 10/24/23  9:22 AM   Specimen: STOOL  Result Value Ref Range Status   Campylobacter species NOT DETECTED NOT DETECTED Final   Plesimonas shigelloides NOT DETECTED NOT DETECTED Final   Salmonella species DETECTED (A) NOT DETECTED Final    Comment: RESULT CALLED TO, READ BACK BY AND VERIFIED WITH: MERILEE MODENA 10/24/23 1307 MW    Yersinia enterocolitica NOT DETECTED NOT DETECTED Final   Vibrio species NOT DETECTED NOT DETECTED Final   Vibrio cholerae NOT DETECTED NOT DETECTED Final   Enteroaggregative E coli (EAEC) NOT DETECTED NOT DETECTED Final   Enteropathogenic E coli (EPEC) NOT DETECTED NOT DETECTED Final   Enterotoxigenic E coli (ETEC) NOT DETECTED NOT DETECTED Final   Shiga like toxin producing E coli (STEC) NOT DETECTED NOT DETECTED Final   Shigella/Enteroinvasive E coli (EIEC) NOT DETECTED NOT DETECTED Final   Cryptosporidium NOT DETECTED NOT DETECTED Final   Cyclospora cayetanensis NOT DETECTED NOT DETECTED Final   Entamoeba histolytica NOT DETECTED NOT DETECTED Final   Giardia lamblia NOT DETECTED NOT DETECTED Final   Adenovirus F40/41 NOT DETECTED NOT DETECTED Final   Astrovirus NOT DETECTED NOT DETECTED Final   Norovirus GI/GII NOT DETECTED NOT DETECTED Final   Rotavirus A NOT DETECTED NOT DETECTED Final   Sapovirus (I, II, IV, and V) NOT DETECTED NOT DETECTED Final    Comment: Performed at [facility], [address] [place], [place] [address]  C Difficile Quick Screen w PCR reflex     Status: None   Collection Time: 10/24/23  9:22 AM   Specimen: STOOL  Result Value Ref Range Status   C Diff antigen NEGATIVE NEGATIVE Final  C Diff toxin NEGATIVE NEGATIVE Final   C Diff interpretation No C. difficile detected.  Final    Comment: Performed at [facility], [address] [place], [place] [address]    Labs: CBC: Recent Labs   Lab 10/25/23 0503 10/26/23 0545 10/27/23 0350 10/28/23 0545  WBC 5.7 6.8 6.3 6.9  NEUTROABS  --  4.7 3.9 3.2  HGB 14.4 13.1 12.5* 12.7*  HCT 42.4 38.2* 37.3* 38.6*  MCV 89.8 89.3 89.2 90.4  PLT 174 171 189 195   Basic Metabolic Panel: Recent Labs  Lab 10/24/23 0418 10/25/23 0503 10/26/23 0545 10/27/23 0350 10/27/23 0358 10/28/23 0545  NA 136 136 134* 136  --  140  K 3.1* 3.1* 3.6 2.9*  --  4.3  CL 99 101 106 108  --  111  CO2 24 25 23  20*  --  24  GLUCOSE 149* 100* 124* 116*  --  120*  BUN 21 23 25* 14  --  14  CREATININE 1.34* 1.38* 1.48* 1.17  --  1.12  CALCIUM  8.4* 8.5* 8.3* 8.4*  --  8.6*  MG  --   --   --   --  1.7  --    Liver Function Tests: No results for input(s): AST, ALT, ALKPHOS, BILITOT, PROT, ALBUMIN in the last 168 hours. CBG: Recent Labs  Lab 10/29/23 0724 10/29/23 1124 10/29/23 1606 10/29/23 2105 10/30/23 0720  GLUCAP 128* 176* 116* 149* 130*    Discharge time spent:  38 minutes.  Signed: Drue ONEIDA Potter, MD [facility] 10/30/2023

## 2023-10-30 NOTE — Plan of Care (Signed)

## 2023-10-30 NOTE — Progress Notes (Signed)
 Mobility Specialist - Progress Note   10/30/23 0948  Mobility  Activity Ambulated with assistance in hallway;Ambulated with assistance to bathroom  Level of Assistance Contact guard assist, steadying assist  Assistive Device Front wheel walker  Distance Ambulated (ft) 320 ft  Activity Response Tolerated well  Mobility visit 1 Mobility  Mobility Specialist Start Time (ACUTE ONLY) [id number]  Mobility Specialist Stop Time (ACUTE ONLY) 0933  Mobility Specialist Time Calculation (min) (ACUTE ONLY) 15 min   Pt sitting in the recliner upon entry, utilizing RA. Pt motivated and agreeable to amb within the hallway. Pt STS to RW CGA, endorsed min lightheadedness which subsided shortly after standing. Pt amb to/from the bathroom and completed two laps around the NS CGA-- no LOB. Pt returned to the room, left seated in the recliner with alarm set and needs within reach.  Bobby Chen Mobility Specialist 10/30/23 10:42 AM

## 2024-04-23 ENCOUNTER — Ambulatory Visit (INDEPENDENT_AMBULATORY_CARE_PROVIDER_SITE_OTHER): Payer: Medicare Other | Admitting: Nurse Practitioner
